# Patient Record
Sex: Female | Born: 1950 | Race: White | Hispanic: No | Marital: Single | Smoking: Former smoker
Health system: Southern US, Community
[De-identification: ages and names within clinical notes are randomized; demographics above are authoritative.]

## PROBLEM LIST (undated history)

## (undated) DIAGNOSIS — J189 Pneumonia, unspecified organism: Secondary | ICD-10-CM

## (undated) DIAGNOSIS — T8859XA Other complications of anesthesia, initial encounter: Secondary | ICD-10-CM

## (undated) DIAGNOSIS — G43109 Migraine with aura, not intractable, without status migrainosus: Secondary | ICD-10-CM

## (undated) DIAGNOSIS — I2510 Atherosclerotic heart disease of native coronary artery without angina pectoris: Secondary | ICD-10-CM

## (undated) DIAGNOSIS — G4730 Sleep apnea, unspecified: Secondary | ICD-10-CM

## (undated) DIAGNOSIS — T4145XA Adverse effect of unspecified anesthetic, initial encounter: Secondary | ICD-10-CM

## (undated) DIAGNOSIS — Z9289 Personal history of other medical treatment: Secondary | ICD-10-CM

## (undated) DIAGNOSIS — M1990 Unspecified osteoarthritis, unspecified site: Secondary | ICD-10-CM

## (undated) DIAGNOSIS — K219 Gastro-esophageal reflux disease without esophagitis: Secondary | ICD-10-CM

## (undated) DIAGNOSIS — Z8489 Family history of other specified conditions: Secondary | ICD-10-CM

## (undated) HISTORY — PX: MOUTH SURGERY: SHX715

## (undated) HISTORY — PX: CARPAL TUNNEL RELEASE: SHX101

## (undated) HISTORY — PX: CATARACT EXTRACTION W/ INTRAOCULAR LENS  IMPLANT, BILATERAL: SHX1307

---

## 2015-03-15 ENCOUNTER — Other Ambulatory Visit: Payer: Self-pay | Admitting: Internal Medicine

## 2015-03-15 ENCOUNTER — Inpatient Hospital Stay (HOSPITAL_COMMUNITY)
Admission: EM | Admit: 2015-03-15 | Discharge: 2015-04-04 | DRG: 208 | Disposition: A | Discharge status: Skilled Nursing Facility | Payer: Medicare HMO | Attending: Pulmonary Disease | Admitting: Pulmonary Disease

## 2015-03-15 ENCOUNTER — Ambulatory Visit
Admission: RE | Admit: 2015-03-15 | Discharge: 2015-03-15 | Disposition: A | Discharge status: Home / Self Care | Payer: Medicare HMO | Source: Ambulatory Visit | Attending: Internal Medicine | Admitting: Internal Medicine

## 2015-03-15 ENCOUNTER — Encounter (HOSPITAL_COMMUNITY): Payer: Self-pay | Admitting: Emergency Medicine

## 2015-03-15 DIAGNOSIS — I248 Other forms of acute ischemic heart disease: Secondary | ICD-10-CM | POA: Diagnosis present

## 2015-03-15 DIAGNOSIS — Z01818 Encounter for other preprocedural examination: Secondary | ICD-10-CM

## 2015-03-15 DIAGNOSIS — I10 Essential (primary) hypertension: Secondary | ICD-10-CM | POA: Diagnosis present

## 2015-03-15 DIAGNOSIS — I2510 Atherosclerotic heart disease of native coronary artery without angina pectoris: Secondary | ICD-10-CM | POA: Diagnosis present

## 2015-03-15 DIAGNOSIS — R0600 Dyspnea, unspecified: Secondary | ICD-10-CM

## 2015-03-15 DIAGNOSIS — R778 Other specified abnormalities of plasma proteins: Secondary | ICD-10-CM | POA: Diagnosis present

## 2015-03-15 DIAGNOSIS — J939 Pneumothorax, unspecified: Secondary | ICD-10-CM

## 2015-03-15 DIAGNOSIS — I421 Obstructive hypertrophic cardiomyopathy: Secondary | ICD-10-CM | POA: Diagnosis present

## 2015-03-15 DIAGNOSIS — R230 Cyanosis: Secondary | ICD-10-CM | POA: Diagnosis present

## 2015-03-15 DIAGNOSIS — Z978 Presence of other specified devices: Secondary | ICD-10-CM

## 2015-03-15 DIAGNOSIS — G4733 Obstructive sleep apnea (adult) (pediatric): Secondary | ICD-10-CM | POA: Diagnosis present

## 2015-03-15 DIAGNOSIS — I4891 Unspecified atrial fibrillation: Secondary | ICD-10-CM | POA: Diagnosis present

## 2015-03-15 DIAGNOSIS — J9600 Acute respiratory failure, unspecified whether with hypoxia or hypercapnia: Secondary | ICD-10-CM

## 2015-03-15 DIAGNOSIS — Z961 Presence of intraocular lens: Secondary | ICD-10-CM | POA: Diagnosis present

## 2015-03-15 DIAGNOSIS — I422 Other hypertrophic cardiomyopathy: Secondary | ICD-10-CM | POA: Diagnosis present

## 2015-03-15 DIAGNOSIS — R7989 Other specified abnormal findings of blood chemistry: Secondary | ICD-10-CM | POA: Diagnosis not present

## 2015-03-15 DIAGNOSIS — I058 Other rheumatic mitral valve diseases: Secondary | ICD-10-CM

## 2015-03-15 DIAGNOSIS — J189 Pneumonia, unspecified organism: Secondary | ICD-10-CM | POA: Diagnosis present

## 2015-03-15 DIAGNOSIS — Z87891 Personal history of nicotine dependence: Secondary | ICD-10-CM

## 2015-03-15 DIAGNOSIS — K219 Gastro-esophageal reflux disease without esophagitis: Secondary | ICD-10-CM | POA: Diagnosis present

## 2015-03-15 DIAGNOSIS — Z880 Allergy status to penicillin: Secondary | ICD-10-CM

## 2015-03-15 DIAGNOSIS — Z79899 Other long term (current) drug therapy: Secondary | ICD-10-CM

## 2015-03-15 DIAGNOSIS — Z881 Allergy status to other antibiotic agents status: Secondary | ICD-10-CM

## 2015-03-15 DIAGNOSIS — I5189 Other ill-defined heart diseases: Secondary | ICD-10-CM | POA: Insufficient documentation

## 2015-03-15 DIAGNOSIS — J9601 Acute respiratory failure with hypoxia: Secondary | ICD-10-CM | POA: Diagnosis present

## 2015-03-15 DIAGNOSIS — R0602 Shortness of breath: Secondary | ICD-10-CM | POA: Diagnosis present

## 2015-03-15 DIAGNOSIS — Z882 Allergy status to sulfonamides status: Secondary | ICD-10-CM

## 2015-03-15 DIAGNOSIS — Z6841 Body Mass Index (BMI) 40.0 and over, adult: Secondary | ICD-10-CM

## 2015-03-15 DIAGNOSIS — N179 Acute kidney failure, unspecified: Secondary | ICD-10-CM | POA: Diagnosis present

## 2015-03-15 DIAGNOSIS — I7389 Other specified peripheral vascular diseases: Secondary | ICD-10-CM

## 2015-03-15 DIAGNOSIS — I080 Rheumatic disorders of both mitral and aortic valves: Secondary | ICD-10-CM | POA: Diagnosis present

## 2015-03-15 DIAGNOSIS — J181 Lobar pneumonia, unspecified organism: Principal | ICD-10-CM | POA: Diagnosis present

## 2015-03-15 DIAGNOSIS — Z9842 Cataract extraction status, left eye: Secondary | ICD-10-CM

## 2015-03-15 DIAGNOSIS — J9690 Respiratory failure, unspecified, unspecified whether with hypoxia or hypercapnia: Secondary | ICD-10-CM

## 2015-03-15 DIAGNOSIS — M069 Rheumatoid arthritis, unspecified: Secondary | ICD-10-CM | POA: Diagnosis present

## 2015-03-15 DIAGNOSIS — Z23 Encounter for immunization: Secondary | ICD-10-CM

## 2015-03-15 DIAGNOSIS — Z9841 Cataract extraction status, right eye: Secondary | ICD-10-CM

## 2015-03-15 DIAGNOSIS — I5031 Acute diastolic (congestive) heart failure: Secondary | ICD-10-CM | POA: Diagnosis present

## 2015-03-15 HISTORY — DX: Adverse effect of unspecified anesthetic, initial encounter: T41.45XA

## 2015-03-15 HISTORY — DX: Pneumonia, unspecified organism: J18.9

## 2015-03-15 HISTORY — DX: Migraine with aura, not intractable, without status migrainosus: G43.109

## 2015-03-15 HISTORY — DX: Unspecified osteoarthritis, unspecified site: M19.90

## 2015-03-15 HISTORY — DX: Personal history of other medical treatment: Z92.89

## 2015-03-15 HISTORY — DX: Other complications of anesthesia, initial encounter: T88.59XA

## 2015-03-15 HISTORY — DX: Gastro-esophageal reflux disease without esophagitis: K21.9

## 2015-03-15 HISTORY — DX: Family history of other specified conditions: Z84.89

## 2015-03-15 HISTORY — DX: Sleep apnea, unspecified: G47.30

## 2015-03-15 LAB — DIFFERENTIAL
BASOS ABS: 0 10*3/uL (ref 0.0–0.1)
Basophils Relative: 0 %
Eosinophils Absolute: 0.1 10*3/uL (ref 0.0–0.7)
Eosinophils Relative: 1 %
LYMPHS ABS: 1.1 10*3/uL (ref 0.7–4.0)
LYMPHS PCT: 10 %
Monocytes Absolute: 1 10*3/uL (ref 0.1–1.0)
Monocytes Relative: 9 %
NEUTROS PCT: 80 %
Neutro Abs: 9.1 10*3/uL — ABNORMAL HIGH (ref 1.7–7.7)

## 2015-03-15 LAB — BASIC METABOLIC PANEL
ANION GAP: 14 (ref 5–15)
BUN: 11 mg/dL (ref 6–20)
CALCIUM: 9.2 mg/dL (ref 8.9–10.3)
CO2: 21 mmol/L — ABNORMAL LOW (ref 22–32)
Chloride: 101 mmol/L (ref 101–111)
Creatinine, Ser: 0.95 mg/dL (ref 0.44–1.00)
Glucose, Bld: 109 mg/dL — ABNORMAL HIGH (ref 65–99)
Potassium: 4.1 mmol/L (ref 3.5–5.1)
Sodium: 136 mmol/L (ref 135–145)

## 2015-03-15 LAB — CBC
HCT: 43.1 % (ref 36.0–46.0)
HEMOGLOBIN: 14.1 g/dL (ref 12.0–15.0)
MCH: 29.8 pg (ref 26.0–34.0)
MCHC: 32.7 g/dL (ref 30.0–36.0)
MCV: 91.1 fL (ref 78.0–100.0)
Platelets: 478 10*3/uL — ABNORMAL HIGH (ref 150–400)
RBC: 4.73 MIL/uL (ref 3.87–5.11)
RDW: 13 % (ref 11.5–15.5)
WBC: 11.7 10*3/uL — ABNORMAL HIGH (ref 4.0–10.5)

## 2015-03-15 LAB — I-STAT TROPONIN, ED: TROPONIN I, POC: 1.21 ng/mL — AB (ref 0.00–0.08)

## 2015-03-15 LAB — TROPONIN I: TROPONIN I: 0.97 ng/mL — AB (ref <0.031)

## 2015-03-15 LAB — BRAIN NATRIURETIC PEPTIDE: B Natriuretic Peptide: 94.5 pg/mL (ref 0.0–100.0)

## 2015-03-15 LAB — I-STAT CG4 LACTIC ACID, ED
Lactic Acid, Venous: 1.65 mmol/L (ref 0.5–2.0)
Lactic Acid, Venous: 0.79 mmol/L (ref 0.5–2.0)

## 2015-03-15 LAB — STREP PNEUMONIAE URINARY ANTIGEN: STREP PNEUMO URINARY ANTIGEN: NEGATIVE

## 2015-03-15 MED ORDER — BORON 3 MG PO CAPS: 3.0000 mg | ORAL_CAPSULE | Freq: Every day | ORAL | Status: DC

## 2015-03-15 MED ORDER — LEVOFLOXACIN IN D5W 750 MG/150ML IV SOLN
750.0000 mg | INTRAVENOUS | Status: DC
  Administered 2015-03-16 – 2015-03-17 (×2): 750 mg via INTRAVENOUS
  Filled 2015-03-15 (×3): qty 150

## 2015-03-15 MED ORDER — PNEUMOCOCCAL VAC POLYVALENT 25 MCG/0.5ML IJ INJ
0.5000 mL | INJECTION | INTRAMUSCULAR | Status: AC
  Administered 2015-03-16: 0.5 mL via INTRAMUSCULAR
  Filled 2015-03-15: qty 0.5

## 2015-03-15 MED ORDER — CALCIUM CARBONATE ANTACID 1000 MG PO CHEW: 1000.0000 mg | CHEWABLE_TABLET | Freq: Every day | ORAL | Status: DC

## 2015-03-15 MED ORDER — SODIUM CHLORIDE 0.9 % IV BOLUS (SEPSIS)
1000.0000 mL | Freq: Once | INTRAVENOUS | Status: AC
  Administered 2015-03-15: 1000 mL via INTRAVENOUS

## 2015-03-15 MED ORDER — LECITHIN 1200 MG PO CAPS: 1200.0000 mg | ORAL_CAPSULE | Freq: Every day | ORAL | Status: DC

## 2015-03-15 MED ORDER — LYSINE 1000 MG PO TABS: 1000.0000 mg | ORAL_TABLET | Freq: Every day | ORAL | Status: DC

## 2015-03-15 MED ORDER — LORATADINE 10 MG PO TABS
10.0000 mg | ORAL_TABLET | Freq: Every day | ORAL | Status: DC
  Administered 2015-03-15 – 2015-03-19 (×5): 10 mg via ORAL
  Filled 2015-03-15 (×6): qty 1

## 2015-03-15 MED ORDER — VITAMIN C 500 MG PO TABS
2000.0000 mg | ORAL_TABLET | Freq: Every day | ORAL | Status: DC
  Administered 2015-03-16 – 2015-03-20 (×5): 2000 mg via ORAL
  Filled 2015-03-15 (×5): qty 4

## 2015-03-15 MED ORDER — LEVOFLOXACIN IN D5W 750 MG/150ML IV SOLN
750.0000 mg | Freq: Once | INTRAVENOUS | Status: AC
  Administered 2015-03-15: 750 mg via INTRAVENOUS
  Filled 2015-03-15: qty 150

## 2015-03-15 MED ORDER — CINNAMON 500 MG PO CAPS: 1000.0000 mg | ORAL_CAPSULE | Freq: Every day | ORAL | Status: DC

## 2015-03-15 MED ORDER — ASPIRIN 81 MG PO CHEW
324.0000 mg | CHEWABLE_TABLET | Freq: Once | ORAL | Status: AC
  Administered 2015-03-15: 324 mg via ORAL
  Filled 2015-03-15: qty 4

## 2015-03-15 MED ORDER — RED YEAST RICE 600 MG PO TABS: 1200.0000 mg | ORAL_TABLET | Freq: Every day | ORAL | Status: DC

## 2015-03-15 MED ORDER — BETA CAROTENE 15 MG PO CAPS: 15.0000 mg | ORAL_CAPSULE | Freq: Every day | ORAL | Status: DC

## 2015-03-15 MED ORDER — VITAMIN C 500 MG PO TABS: 5000.0000 mg | ORAL_TABLET | Freq: Every day | ORAL | Status: DC

## 2015-03-15 MED ORDER — CALCIUM CARBONATE ANTACID 500 MG PO CHEW
3.0000 | CHEWABLE_TABLET | Freq: Every day | ORAL | Status: DC | PRN
  Administered 2015-03-18: 800 mg via ORAL
  Filled 2015-03-15 (×2): qty 4

## 2015-03-15 MED ORDER — ENOXAPARIN SODIUM 40 MG/0.4ML ~~LOC~~ SOLN
40.0000 mg | SUBCUTANEOUS | Status: DC
  Administered 2015-03-15 – 2015-04-03 (×13): 40 mg via SUBCUTANEOUS
  Filled 2015-03-15 (×15): qty 0.4

## 2015-03-15 MED ORDER — BILBERRY 1000 MG PO CAPS: 1000.0000 mg | ORAL_CAPSULE | Freq: Every day | ORAL | Status: DC

## 2015-03-15 NOTE — Progress Notes (Signed)
When pt arrived to the unit, she expressed a desire to reverse her code status from a DNR to a full code after she spoke with the MD. I asked another nurse to come into the room to verify what she was saying. Charlane Ferretti, RN came into the room to witness what she said, verifying that she be a FULL CODE. I paged Dr. Silvestre Moment to make him aware. Status changed

## 2015-03-15 NOTE — ED Notes (Signed)
Wheeled pt to room.

## 2015-03-15 NOTE — ED Provider Notes (Signed)
CSN: [id number]     Arrival date & time 03/15/15  1200 History   First MD Initiated Contact with Patient 03/15/15 1303     Chief Complaint  Patient presents with  . Shortness of Breath     (Consider location/radiation/quality/duration/timing/severity/associated sxs/prior Treatment) HPI 65 year old female who presents with shortness of breath. History of carpal tunnel and otherwise is healthy, and prior tobacco use. 2 days ago developed what she reported was flulike illness including generalized weakness, fatigue, bronchospastic cough, shortness of breath. Associated with diarrhea. States that illness has been progressive, with worsening shortness of breath. Has not had known fevers but reports chills. Denies chest pain, nausea or vomiting, abdominal pain, or urinary complaints. No lower extremity edema or pain, no orthopnea or PND. History reviewed. No pertinent past medical history. Past Surgical History  Procedure Laterality Date  . Carpal tunnel release Bilateral   . Cataract extraction Bilateral    History reviewed. No pertinent family history. Social History  Substance Use Topics  . Smoking status: Former Games developer  . Smokeless tobacco: None  . Alcohol Use: Yes     Comment: social   OB History    No data available     Review of Systems 10/14 systems reviewed and are negative other than those stated in the HPI    Allergies  Penicillins; Sulfa antibiotics; and Ceclor  Home Medications   Prior to Admission medications   Not on File   BP 117/60 mmHg  Pulse 92  Temp(Src) 97.6 F (36.4 C)  Resp 27  SpO2 92% Physical Exam Physical Exam  Nursing note and vitals reviewed. Constitutional: Well developed, well nourished, non-toxic, and in no acute distress Head: Normocephalic and atraumatic.  Mouth/Throat: Oropharynx is clear and moist.  Neck: Normal range of motion. Neck supple.  Cardiovascular: Normal rate and regular rhythm.  No edema. Pulmonary/Chest: Effort  normal. Mild conversational dyspnea, crackles and rhonchi at the lung bases. Abdominal: Soft. There is no tenderness. There is no rebound and no guarding.  Musculoskeletal: Normal range of motion.  Neurological: Alert, no facial droop, fluent speech, moves all extremities symmetrically Skin: Skin is warm and dry.  Psychiatric: Cooperative  ED Course  Procedures (including critical care time) Labs Review Labs Reviewed  BASIC METABOLIC PANEL - Abnormal; Notable for the following:    CO2 21 (*)    Glucose, Bld 109 (*)    All other components within normal limits  CBC - Abnormal; Notable for the following:    WBC 11.7 (*)    Platelets 478 (*)    All other components within normal limits  I-STAT TROPOININ, ED - Abnormal; Notable for the following:    Troponin i, poc 1.21 (*)    All other components within normal limits  BRAIN NATRIURETIC PEPTIDE  DIFFERENTIAL  I-STAT CG4 LACTIC ACID, ED    Imaging Review Dg Chest 2 View  03/15/2015  CLINICAL DATA:  Severe shortness of breath for 1 week EXAM: CHEST  2 VIEW COMPARISON:  None. FINDINGS: Mild cardiac enlargement. Vascular pattern is normal. Multifocal airspace disease throughout the bilateral mid to lower lung zones, left worse than right. No pleural effusion. IMPRESSION: Moderately severe multifocal bilateral nonspecific airspace disease. One consideration would be pneumonia. Electronically Signed   By: Esperanza Heir M.D.   On: 03/15/2015 11:19   I have personally reviewed and evaluated these images and lab results as part of my medical decision-making.   EKG Interpretation   Date/Time:  Friday March 15 2015 12:04:48 EST  Ventricular Rate:  97 PR Interval:  128 QRS Duration: 96 QT Interval:  360 QTC Calculation: 457 R Axis:   -29 Text Interpretation:  Normal sinus rhythm Left ventricular hypertrophy  with repolarization abnormality Cannot rule out Septal infarct , age  undetermined Abnormal ECG Confirmed by Aggie Douse MD, Annabelle Harman  ([id number]) on 03/15/2015  1:29:01 PM      MDM   Final diagnoses:  None    65 year old female who presents with 1-2 days of flulike illness with progressive shortness of breath. On room air with normal oxygenation, but some mild conversational dyspnea. Chest x-ray reviewed from today, and showing evidence of multifocal infiltrates most likely due to pneumonia.  Initiated on Levaquin for treatment of community-acquired pneumonia. She has normal lactate, and mild leukocytosis. With evidence of troponin elevation of 1.2. Seems a little bit more elevated to be just demand ischemia, and EKG revealing LVH with suggestion of ischemia; however, EKG read does state acute MI.  2:09 PM Discussed with Dr. Excell Seltzer from cardiology. Question possible myocarditis given significant troponin elevation, but does not think EKG concerning for STEMI. Recommended  ECHO. Discussed with Dr. Waymon Amato who will admit to telemetry.  Lavera Guise, MD 03/15/15 1500

## 2015-03-15 NOTE — H&P (Signed)
Patient Demographics  Rachel Baker, is a 65 y.o. female  MRN: [id number]   DOB - 02-28-1950  Admit Date - 03/15/2015  Outpatient Primary MD for the patient is No primary care provider on file.   With History of -  History reviewed. No pertinent past medical history.    Past Surgical History  Procedure Laterality Date  . Carpal tunnel release Bilateral   . Cataract extraction Bilateral     in for   Chief Complaint  Patient presents with  . Shortness of Breath     HPI  Rachel Baker  is a 65 y.o. female, without significant past medical history, history of prior tobacco use, quit 25 years ago, patient was sent for her PCP for pneumonia, report she has been complaining of dyspnea, denies any cough, with the PCP where he is an x-ray which he found her to have a pneumonia, she was sent to ED, in ED chest x-ray showing multifocal pneumonia, after the levofloxacin, afebrile, no hypoxia or leukocytosis, patient troponin point-of-care was positive, patient denies any chest pain, diaphoresis or palpitation, EKG was abnormal, no baseline EKG to compare, hospitalist requested to admit for further evaluation.    Review of Systems    In addition to the HPI above, No Fever-chills, No Headache, No changes with Vision or hearing, No problems swallowing food or Liquids, No Chest pain, Cough she reports Shortness of Breath, No Abdominal pain, No Nausea or Vommitting, Bowel movements are regular, No Blood in stool or Urine, No dysuria, No new skin rashes or bruises, No new joints pains-aches,  No new weakness, tingling, numbness in any extremity, No recent weight gain or loss, No polyuria, polydypsia or polyphagia, No significant Mental Stressors.  A full 10 point Review of Systems was done, except as stated above, all other Review of Systems were negative.   Social History Social History  Substance Use Topics  . Smoking status: Former Games developer  . Smokeless tobacco:  Not on file  . Alcohol Use: Yes     Comment: social    Family History History reviewed. No pertinent family history.   Prior to Admission medications   Medication Sig Start Date End Date Taking? Authorizing Provider  Ascorbic Acid (VITAMIN C) 1000 MG tablet Take 5,000 mg by mouth daily.   Yes Historical Provider, MD  B Complex Vitamins (B COMPLEX PO) Take 1 tablet by mouth daily.   Yes Historical Provider, MD  beta carotene 15 MG capsule Take 15 mg by mouth daily.   Yes Historical Provider, MD  Bilberry 1000 MG CAPS Take 1,000 mg by mouth daily.   Yes Historical Provider, MD  Boron 3 MG CAPS Take 3 mg by mouth daily.   Yes Historical Provider, MD  calcium elemental as carbonate (BARIATRIC TUMS ULTRA) 400 MG chewable tablet Chew 1,000 mg by mouth daily. With Magnesium 500 mg   Yes Historical Provider, MD  cetirizine-pseudoephedrine (ZYRTEC-D) 5-120 MG tablet Take 1 tablet by mouth at bedtime.   Yes Historical Provider, MD  Cinnamon 500 MG capsule Take 1,000 mg by mouth daily.   Yes Historical Provider, MD  CRANBERRY EXTRACT PO Take 1 tablet by mouth daily.   Yes Historical Provider, MD  GARLIC PO Take 1 tablet by mouth daily.   Yes Historical Provider, MD  Lecithin 1200 MG CAPS Take 1,200 mg by mouth daily.   Yes Historical Provider, MD  Lysine 1000 MG TABS Take 1,000 mg by mouth daily.  Yes Historical Provider, MD  OVER THE COUNTER MEDICATION Take 3 tablets by mouth daily. [employer] Dr. Joints   Yes Historical Provider, MD  Probiotic Product (PROBIOTIC PO) Take 1 tablet by mouth daily.   Yes Historical Provider, MD  Red Yeast Rice 600 MG TABS Take 1,200 mg by mouth daily.   Yes Historical Provider, MD    Allergies  Allergen Reactions  . Penicillins Hives and Itching    Has patient had a PCN reaction causing immediate rash, facial/tongue/throat swelling, SOB or lightheadedness with hypotension: Yes Has patient had a PCN reaction causing severe rash involving mucus membranes or skin  necrosis: No Has patient had a PCN reaction that required hospitalization No Has patient had a PCN reaction occurring within the last 10 years: No If all of the above answers are "NO", then may proceed with Cephalosporin use.   . Sulfa Antibiotics Hives and Itching  . Ceclor [Cefaclor] Itching and Rash    Physical Exam  Vitals  Blood pressure 108/65, pulse 90, temperature 97.6 F (36.4 C), resp. rate 20, SpO2 93 %.   1. General well-developed female lying in bed in NAD,    2. Normal affect and insight, Not Suicidal or Homicidal, Awake Alert, Oriented X 3.  3. No F.N deficits, ALL C.Nerves Intact, Strength 5/5 all 4 extremities, Sensation intact all 4 extremities, Plantars down going.  4. Ears and Eyes appear Normal, Conjunctivae clear, PERRLA. Moist Oral Mucosa.  5. Supple Neck, No JVD, No cervical lymphadenopathy appriciated, No Carotid Bruits.  6. Symmetrical Chest wall movement, Good air movement bilaterally, CTAB.  7. RRR, No Gallops, Rubs or Murmurs, No Parasternal Heave.  8. Positive Bowel Sounds, Abdomen Soft, No tenderness, No organomegaly appriciated,No rebound -guarding or rigidity.  9.  No Cyanosis, Normal Skin Turgor, No Skin Rash or Bruise.  10. Good muscle tone,  joints appear normal , no effusions, Normal ROM.  11. No Palpable Lymph Nodes in Neck or Axillae    Data Review  CBC  Recent Labs Lab 03/15/15 1219  WBC 11.7*  HGB 14.1  HCT 43.1  PLT 478*  MCV 91.1  MCH 29.8  MCHC 32.7  RDW 13.0  LYMPHSABS 1.1  MONOABS 1.0  EOSABS 0.1  BASOSABS 0.0   ------------------------------------------------------------------------------------------------------------------  Chemistries   Recent Labs Lab 03/15/15 1219  NA 136  K 4.1  CL 101  CO2 21*  GLUCOSE 109*  BUN 11  CREATININE 0.95  CALCIUM 9.2   ------------------------------------------------------------------------------------------------------------------ CrCl cannot be calculated  (Unknown ideal weight.). ------------------------------------------------------------------------------------------------------------------ No results for input(s): TSH, T4TOTAL, T3FREE, THYROIDAB in the last 72 hours.  Invalid input(s): FREET3   Coagulation profile No results for input(s): INR, PROTIME in the last 168 hours. ------------------------------------------------------------------------------------------------------------------- No results for input(s): DDIMER in the last 72 hours. -------------------------------------------------------------------------------------------------------------------  Cardiac Enzymes No results for input(s): CKMB, TROPONINI, MYOGLOBIN in the last 168 hours.  Invalid input(s): CK ------------------------------------------------------------------------------------------------------------------ Invalid input(s): POCBNP   ---------------------------------------------------------------------------------------------------------------  Urinalysis No results found for: COLORURINE, APPEARANCEUR, LABSPEC, PHURINE, GLUCOSEU, HGBUR, BILIRUBINUR, KETONESUR, PROTEINUR, UROBILINOGEN, NITRITE, LEUKOCYTESUR  ----------------------------------------------------------------------------------------------------------------  Imaging results:   Dg Chest 2 View  03/15/2015  CLINICAL DATA:  Severe shortness of breath for 1 week EXAM: CHEST  2 VIEW COMPARISON:  None. FINDINGS: Mild cardiac enlargement. Vascular pattern is normal. Multifocal airspace disease throughout the bilateral mid to lower lung zones, left worse than right. No pleural effusion. IMPRESSION: Moderately severe multifocal bilateral nonspecific airspace disease. One consideration would be pneumonia. Electronically Signed   By: Esperanza Heir  M.D.   On: 03/15/2015 11:19        Assessment & Plan  Active Problems:   CAP (community acquired pneumonia)   Pneumonia   Elevated  troponin   Community-acquired pneumonia - Patient presents with dyspnea, chest x-ray significant for multifocal pneumonia, admitted under pneumonia pathway, will obtain Legionella and strep pneumoniae antibodies, we'll follow sputum cultures, continue with IV levofloxacin.  Elevated troponin with abnormal EKG - Patient had elevated point of care troponin, she denies any chest pain, will admit to telemetry, will cycle her cardiac enzymes, will check 2-D echo in a.m., cardiology consulted by ED.   DVT Prophylaxis  Lovenox - SCDs  AM Labs Ordered, also please review Full Orders  Family Communication: Admission, patients condition and plan of care including tests being ordered have been discussed with the patient and sister who indicate understanding and agree with the plan and Code Status.  Code Status DO NOT RESUSCITATE confirmed by patient  Likely DC to  home  Condition GUARDED    Time spent in minutes : 50 minutes    Lalla Laham M.D on 03/15/2015 at 2:48 PM  Between 7am to 7pm - Pager - [phone]  After 7pm go to [email or web address] - password [email or web address]  And look for the night coverage person covering me after hours  [facility] Office  [phone]

## 2015-03-15 NOTE — ED Notes (Addendum)
Pt reports SOB x 3 days which has continued to get worse. Pt reports she went to urgent care and they reported that she had pneumonia. Pt alert x4. Pt also has discoloration to middle finger on right hand x 7 days.

## 2015-03-15 NOTE — Progress Notes (Addendum)
PHARMACIST - PHYSICIAN ORDER COMMUNICATION  CONCERNING: P&T Medication Policy on Herbal Medications  DESCRIPTION:  This patient's order for:  Beta carotine, bilberry, boron, cinnamon, lecithin, red yeast rice, lysine  has been noted.  This product(s) is classified as an "herbal" or natural product. Due to a lack of definitive safety studies or FDA approval, nonstandard manufacturing practices, plus the potential risk of unknown drug-drug interactions while on inpatient medications, the Pharmacy and Therapeutics Committee does not permit the use of "herbal" or natural products of this type within [facility].   ACTION TAKEN: The pharmacy department is unable to verify this order at this time and your patient has been informed of this safety policy. Please reevaluate patient's clinical condition at discharge and address if the herbal or natural product(s) should be resumed at that time.  [place]erve, [address].D., BCPS Clinical Pharmacist Pager: [phone] 03/15/2015 5:15 PM

## 2015-03-15 NOTE — Progress Notes (Signed)
CODE STATUS changed to full code as per patient request. Huey Bienenstock MD

## 2015-03-15 NOTE — ED Notes (Signed)
Pt ambulated to restroom located in room.

## 2015-03-16 ENCOUNTER — Observation Stay (HOSPITAL_BASED_OUTPATIENT_CLINIC_OR_DEPARTMENT_OTHER): Payer: Medicare HMO

## 2015-03-16 DIAGNOSIS — R0600 Dyspnea, unspecified: Secondary | ICD-10-CM | POA: Diagnosis not present

## 2015-03-16 DIAGNOSIS — N179 Acute kidney failure, unspecified: Secondary | ICD-10-CM | POA: Diagnosis not present

## 2015-03-16 DIAGNOSIS — Z9842 Cataract extraction status, left eye: Secondary | ICD-10-CM | POA: Diagnosis not present

## 2015-03-16 DIAGNOSIS — K219 Gastro-esophageal reflux disease without esophagitis: Secondary | ICD-10-CM | POA: Diagnosis not present

## 2015-03-16 DIAGNOSIS — I7300 Raynaud's syndrome without gangrene: Secondary | ICD-10-CM

## 2015-03-16 DIAGNOSIS — R7989 Other specified abnormal findings of blood chemistry: Secondary | ICD-10-CM

## 2015-03-16 DIAGNOSIS — Z6841 Body Mass Index (BMI) 40.0 and over, adult: Secondary | ICD-10-CM | POA: Diagnosis not present

## 2015-03-16 DIAGNOSIS — I10 Essential (primary) hypertension: Secondary | ICD-10-CM | POA: Diagnosis not present

## 2015-03-16 DIAGNOSIS — Z79899 Other long term (current) drug therapy: Secondary | ICD-10-CM | POA: Diagnosis not present

## 2015-03-16 DIAGNOSIS — J189 Pneumonia, unspecified organism: Secondary | ICD-10-CM | POA: Diagnosis not present

## 2015-03-16 DIAGNOSIS — I050 Rheumatic mitral stenosis: Secondary | ICD-10-CM | POA: Diagnosis not present

## 2015-03-16 DIAGNOSIS — R230 Cyanosis: Secondary | ICD-10-CM | POA: Diagnosis not present

## 2015-03-16 DIAGNOSIS — I5031 Acute diastolic (congestive) heart failure: Secondary | ICD-10-CM | POA: Diagnosis not present

## 2015-03-16 DIAGNOSIS — I7389 Other specified peripheral vascular diseases: Secondary | ICD-10-CM | POA: Diagnosis not present

## 2015-03-16 DIAGNOSIS — J9601 Acute respiratory failure with hypoxia: Secondary | ICD-10-CM | POA: Diagnosis not present

## 2015-03-16 DIAGNOSIS — Z87891 Personal history of nicotine dependence: Secondary | ICD-10-CM | POA: Diagnosis not present

## 2015-03-16 DIAGNOSIS — J181 Lobar pneumonia, unspecified organism: Secondary | ICD-10-CM | POA: Diagnosis not present

## 2015-03-16 DIAGNOSIS — Z961 Presence of intraocular lens: Secondary | ICD-10-CM | POA: Diagnosis not present

## 2015-03-16 DIAGNOSIS — I422 Other hypertrophic cardiomyopathy: Secondary | ICD-10-CM | POA: Diagnosis not present

## 2015-03-16 DIAGNOSIS — G4733 Obstructive sleep apnea (adult) (pediatric): Secondary | ICD-10-CM | POA: Diagnosis not present

## 2015-03-16 DIAGNOSIS — Z882 Allergy status to sulfonamides status: Secondary | ICD-10-CM | POA: Diagnosis not present

## 2015-03-16 DIAGNOSIS — M069 Rheumatoid arthritis, unspecified: Secondary | ICD-10-CM | POA: Diagnosis not present

## 2015-03-16 DIAGNOSIS — Z881 Allergy status to other antibiotic agents status: Secondary | ICD-10-CM | POA: Diagnosis not present

## 2015-03-16 DIAGNOSIS — I2510 Atherosclerotic heart disease of native coronary artery without angina pectoris: Secondary | ICD-10-CM | POA: Diagnosis not present

## 2015-03-16 DIAGNOSIS — Z23 Encounter for immunization: Secondary | ICD-10-CM | POA: Diagnosis not present

## 2015-03-16 DIAGNOSIS — Z9841 Cataract extraction status, right eye: Secondary | ICD-10-CM | POA: Diagnosis not present

## 2015-03-16 DIAGNOSIS — I4891 Unspecified atrial fibrillation: Secondary | ICD-10-CM | POA: Diagnosis not present

## 2015-03-16 DIAGNOSIS — I350 Nonrheumatic aortic (valve) stenosis: Secondary | ICD-10-CM | POA: Diagnosis not present

## 2015-03-16 DIAGNOSIS — R011 Cardiac murmur, unspecified: Secondary | ICD-10-CM | POA: Diagnosis not present

## 2015-03-16 DIAGNOSIS — Z880 Allergy status to penicillin: Secondary | ICD-10-CM | POA: Diagnosis not present

## 2015-03-16 DIAGNOSIS — I248 Other forms of acute ischemic heart disease: Secondary | ICD-10-CM | POA: Diagnosis not present

## 2015-03-16 DIAGNOSIS — I421 Obstructive hypertrophic cardiomyopathy: Secondary | ICD-10-CM | POA: Diagnosis not present

## 2015-03-16 DIAGNOSIS — I080 Rheumatic disorders of both mitral and aortic valves: Secondary | ICD-10-CM | POA: Diagnosis not present

## 2015-03-16 LAB — COMPREHENSIVE METABOLIC PANEL
ALBUMIN: 2.3 g/dL — AB (ref 3.5–5.0)
ALK PHOS: 59 U/L (ref 38–126)
ALT: 21 U/L (ref 14–54)
ANION GAP: 11 (ref 5–15)
AST: 27 U/L (ref 15–41)
BUN: 11 mg/dL (ref 6–20)
CALCIUM: 8.6 mg/dL — AB (ref 8.9–10.3)
CO2: 21 mmol/L — AB (ref 22–32)
Chloride: 103 mmol/L (ref 101–111)
Creatinine, Ser: 0.73 mg/dL (ref 0.44–1.00)
GFR calc Af Amer: >60 mL/min (ref >60)
GFR calc non Af Amer: >60 mL/min (ref >60)
GLUCOSE: 116 mg/dL — AB (ref 65–99)
POTASSIUM: 3.9 mmol/L (ref 3.5–5.1)
SODIUM: 135 mmol/L (ref 135–145)
Total Bilirubin: 0.2 mg/dL — ABNORMAL LOW (ref 0.3–1.2)
Total Protein: 5.8 g/dL — ABNORMAL LOW (ref 6.5–8.1)

## 2015-03-16 LAB — CBC
HCT: 39.8 % (ref 36.0–46.0)
HEMOGLOBIN: 12.9 g/dL (ref 12.0–15.0)
MCH: 29.4 pg (ref 26.0–34.0)
MCHC: 32.4 g/dL (ref 30.0–36.0)
MCV: 90.7 fL (ref 78.0–100.0)
Platelets: 427 10*3/uL — ABNORMAL HIGH (ref 150–400)
RBC: 4.39 MIL/uL (ref 3.87–5.11)
RDW: 13.2 % (ref 11.5–15.5)
WBC: 9.7 10*3/uL (ref 4.0–10.5)

## 2015-03-16 LAB — URINALYSIS, ROUTINE W REFLEX MICROSCOPIC
BILIRUBIN URINE: NEGATIVE
GLUCOSE, UA: NEGATIVE mg/dL
KETONES UR: NEGATIVE mg/dL
Nitrite: NEGATIVE
PROTEIN: NEGATIVE mg/dL
Specific Gravity, Urine: 1.006 (ref 1.005–1.030)
pH: 6.5 (ref 5.0–8.0)

## 2015-03-16 LAB — TROPONIN I
TROPONIN I: 0.61 ng/mL — AB (ref <0.031)
Troponin I: 0.91 ng/mL (ref <0.031)

## 2015-03-16 LAB — HIV ANTIBODY (ROUTINE TESTING W REFLEX): HIV SCREEN 4TH GENERATION: NONREACTIVE

## 2015-03-16 LAB — INFLUENZA PANEL BY PCR (TYPE A & B)
H1N1FLUPCR: NOT DETECTED
INFLBPCR: NEGATIVE
Influenza A By PCR: NEGATIVE

## 2015-03-16 LAB — URINE MICROSCOPIC-ADD ON

## 2015-03-16 LAB — PROCALCITONIN: Procalcitonin: <0.1 ng/mL

## 2015-03-16 LAB — TSH: TSH: 1.955 u[IU]/mL (ref 0.350–4.500)

## 2015-03-16 MED ORDER — ASPIRIN 81 MG PO CHEW
81.0000 mg | CHEWABLE_TABLET | Freq: Every day | ORAL | Status: DC
  Administered 2015-03-16 – 2015-03-20 (×5): 81 mg via ORAL
  Filled 2015-03-16 (×5): qty 1

## 2015-03-16 MED ORDER — POTASSIUM CHLORIDE CRYS ER 20 MEQ PO TBCR
20.0000 meq | EXTENDED_RELEASE_TABLET | Freq: Once | ORAL | Status: AC
  Administered 2015-03-16: 20 meq via ORAL
  Filled 2015-03-16: qty 1

## 2015-03-16 MED ORDER — FUROSEMIDE 10 MG/ML IJ SOLN
20.0000 mg | Freq: Once | INTRAMUSCULAR | Status: AC
  Administered 2015-03-16: 20 mg via INTRAVENOUS
  Filled 2015-03-16: qty 2

## 2015-03-16 NOTE — Progress Notes (Signed)
Patient visibly SOB.  She refused O2.  Dr. Jerral Ralph notified.

## 2015-03-16 NOTE — Progress Notes (Signed)
PATIENT DETAILS Name: Rachel Baker Age: 65 y.o. Sex: female Date of Birth: Jul 09, 1950 Admit Date: 03/15/2015 Admitting Physician Elease Etienne, MD OZH:[id number],[email or web address], MD  Brief narrative:  65 year old female with no significant past medical history presented with 2 week history of exertional dyspnea and generalized weakness. Approximately 2 weeks back, patient had a flulike illness. Further evaluation with a chest x-ray showed multifocal lung infiltrates highly suspicious for pneumonia. She was also found to have related troponin levels. She was subsequently admitted for further evaluation and treatment. See below for further details  Subjective: Continues to have shortness of breath-mostly on exertion-essentially unchanged compared to yesterday.  Assessment/Plan: Active Problems: Presumed CAP (community acquired pneumonia): Continue empiric levofloxacin, blood cultures negative so far. Given normal BNP levels, lung infiltrates likely pneumonia. Urine streptococcal antigen negative, Legionella antigen/HIV pending. Await influenza PCR and respiratory virus panel. Check pro calcitonin levels.  Elevated troponin level: Trend is flat and not consistent with ACS. Await echocardiogram, will likely need mucus stress test at some point in the future. Cardiology following.  Systolic murmur: Await echocardiogram-likely aortic stenosis-but given bilateral pulmonary infiltrates-made to be concerned about endocarditis special. Blood cultures come back positive.  Bluish discoloration of the distal phalanx of the right middle finger:? Raynaud's phenomenon-alternatively could have embolic event. On numerous over-the-counter herbal supplements-not sure of these could account for this as well. Await echocardiogram and blood cultures.  Disposition: Remain inpatient  Antimicrobial agents  See below  Anti-infectives    Start     Dose/Rate Route Frequency Ordered Stop   03/16/15  1500  levofloxacin (LEVAQUIN) IVPB 750 mg     750 mg 100 mL/hr over 90 Minutes Intravenous Every 24 hours 03/15/15 1657 03/21/15 1459   03/15/15 1345  levofloxacin (LEVAQUIN) IVPB 750 mg     750 mg 100 mL/hr over 90 Minutes Intravenous  Once 03/15/15 1330 03/15/15 1527      DVT Prophylaxis: Prophylactic Lovenox   Code Status: Full code   Family Communication None at bedside  Procedures: None  CONSULTS:  cardiology  Time spent 30 minutes-Greater than 50% of this time was spent in counseling, explanation of diagnosis, planning of further management, and coordination of care.  MEDICATIONS: Scheduled Meds: . enoxaparin (LOVENOX) injection  40 mg Subcutaneous Q24H  . levofloxacin (LEVAQUIN) IV  750 mg Intravenous Q24H  . loratadine  10 mg Oral Daily  . vitamin C  2,000 mg Oral Daily   Continuous Infusions:  PRN Meds:.calcium carbonate    PHYSICAL EXAM: Vital signs in last 24 hours: Filed Vitals:   03/15/15 1620 03/15/15 2100 03/16/15 0500 03/16/15 0732  BP: 134/54 129/43 123/54 134/52  Pulse: 90 92 101 88  Temp: 98.3 F (36.8 C) 97.7 F (36.5 C) 98.6 F (37 C) 98.7 F (37.1 C)  TempSrc: Oral Oral Oral Oral  Resp: 20 20 20 20   Height: 5\' 3"  (1.6 m)     Weight: 108.636 kg (239 lb 8 oz)     SpO2: 93% 92% 93% 91%    Weight change:  Filed Weights   03/15/15 1620  Weight: 108.636 kg (239 lb 8 oz)   Body mass index is 42.44 kg/(m^2).   Gen Exam: Awake and alert with clear speech.  Neck: Supple, No JVD.   Chest: Moving air-bibasilar rales CVS: S1 S2 Regular, + systolic murmur.  Abdomen: soft, BS +, non tender, non distended.  Extremities: no edema, lower extremities  warm to touch. Neurologic: Non Focal.   Skin: No Rash.   Wounds: N/A.   Intake/Output from previous day:  Intake/Output Summary (Last 24 hours) at 03/16/15 1329 Last data filed at 03/16/15 1200  Gross per 24 hour  Intake    480 ml  Output    700 ml  Net   -220 ml     LAB  RESULTS: CBC  Recent Labs Lab 03/15/15 1219 03/16/15 1158  WBC 11.7* 9.7  HGB 14.1 12.9  HCT 43.1 39.8  PLT 478* 427*  MCV 91.1 90.7  MCH 29.8 29.4  MCHC 32.7 32.4  RDW 13.0 13.2  LYMPHSABS 1.1  --   MONOABS 1.0  --   EOSABS 0.1  --   BASOSABS 0.0  --     Chemistries   Recent Labs Lab 03/15/15 1219 03/16/15 1158  NA 136 135  K 4.1 3.9  CL 101 103  CO2 21* 21*  GLUCOSE 109* 116*  BUN 11 11  CREATININE 0.95 0.73  CALCIUM 9.2 8.6*    CBG: No results for input(s): GLUCAP in the last 168 hours.  GFR Estimated Creatinine Clearance: 82.9 mL/min (by C-G formula based on Cr of 0.73).  Coagulation profile No results for input(s): INR, PROTIME in the last 168 hours.  Cardiac Enzymes  Recent Labs Lab 03/15/15 2029 03/16/15 0232 03/16/15 1158  TROPONINI 0.97* 0.91* 0.61*    Invalid input(s): POCBNP No results for input(s): DDIMER in the last 72 hours. No results for input(s): HGBA1C in the last 72 hours. No results for input(s): CHOL, HDL, LDLCALC, TRIG, CHOLHDL, LDLDIRECT in the last 72 hours. No results for input(s): TSH, T4TOTAL, T3FREE, THYROIDAB in the last 72 hours.  Invalid input(s): FREET3 No results for input(s): VITAMINB12, FOLATE, FERRITIN, TIBC, IRON, RETICCTPCT in the last 72 hours. No results for input(s): LIPASE, AMYLASE in the last 72 hours.  Urine Studies No results for input(s): UHGB, CRYS in the last 72 hours.  Invalid input(s): UACOL, UAPR, USPG, UPH, UTP, UGL, UKET, UBIL, UNIT, UROB, ULEU, UEPI, UWBC, URBC, UBAC, CAST, UCOM, BILUA  MICROBIOLOGY: Recent Results (from the past 240 hour(s))  Culture, blood (routine x 2) Call MD if unable to obtain prior to antibiotics being given     Status: None (Preliminary result)   Collection Time: 03/15/15  8:29 PM  Result Value Ref Range Status   Specimen Description BLOOD LEFT ANTECUBITAL  Final   Special Requests IN PEDIATRIC BOTTLE 4CC  Final   Culture NO GROWTH < 12 HOURS  Final   Report  Status PENDING  Incomplete  Culture, blood (routine x 2) Call MD if unable to obtain prior to antibiotics being given     Status: None (Preliminary result)   Collection Time: 03/15/15  8:34 PM  Result Value Ref Range Status   Specimen Description BLOOD RIGHT HAND  Final   Special Requests BOTTLES DRAWN AEROBIC ONLY 5CC  Final   Culture NO GROWTH < 12 HOURS  Final   Report Status PENDING  Incomplete    RADIOLOGY STUDIES/RESULTS: Dg Chest 2 View  03/15/2015  CLINICAL DATA:  Severe shortness of breath for 1 week EXAM: CHEST  2 VIEW COMPARISON:  None. FINDINGS: Mild cardiac enlargement. Vascular pattern is normal. Multifocal airspace disease throughout the bilateral mid to lower lung zones, left worse than right. No pleural effusion. IMPRESSION: Moderately severe multifocal bilateral nonspecific airspace disease. One consideration would be pneumonia. Electronically Signed   By: Esperanza Heir M.D.   On:  03/15/2015 11:19    Jeoffrey Massed, MD  [facility] Pager:[phone]  If 7PM-7AM, please contact night-coverage [email or web address] Password [email or web address] 03/16/2015, 1:29 PM   LOS: 1 day

## 2015-03-16 NOTE — Care Management Obs Status (Signed)
MEDICARE OBSERVATION STATUS NOTIFICATION   Patient Details  Name: Rachel Baker MRN: [id number] Date of Birth: 1950/02/05   Medicare Observation Status Notification Given:     MOON and C44 given pt; original to CM file.  Yves Dill, RN 03/16/2015, 3:30 PM

## 2015-03-16 NOTE — Progress Notes (Signed)
  Echocardiogram 2D Echocardiogram has been performed.  Arvil Chaco 03/16/2015, 3:01 PM

## 2015-03-16 NOTE — Consult Note (Signed)
Reason for Consult: Abnormal cardiac troponin  Requesting Physician: Ghimire  Cardiologist: New  HPI: This is a 65 y.o. female with a past medical history significant for the absence of known structural heart disease or major coronary risk factors. She presents with dyspnea presumed to be secondary to pneumonia. She had a prodromal illness with loose stools and sweating about 2 weeks earlier, her sister had the same symptoms. Her sister has improved, but the patient developed progressively worsening shortness of breath. Her chest x-ray showed infiltrates and she was just started on levofloxacin yesterday. She denies fever, chills, cough, hemoptysis, anorexia, nausea or vomiting or abdominal pain. She had a sensation of intense flushing last night accompanied by diaphoresis. She has not had chest pain, either anginal or pleuritic  She has also noticed purplish discoloration of her right third finger, limited to the distal phalanx. It is purplish in color. She reports that this has happened before if she is not careful to keep the finger warm. She distinctly remembers this occurring after having a myelogram years ago. Only that particular fingers ever affected.  She has not had any recent dental procedures or other medical invasive procedures.  Her chest x-ray shows bilateral infiltrates, left greater than right. It could well be compatible with heart failure rather than pneumonia. Her cardiac troponin is elevated and a plateau pattern at 0.9. Her electrocardiogram shows left ventricular hypertrophy with secondary repolarization abnormalities and leftward axis deviation.  PMHx:  Past Medical History  Diagnosis Date  . CAP (community acquired pneumonia) 03/15/2015  . Family history of adverse reaction to anesthesia     "my mother got combative"  . Complication of anesthesia     "I hallucinated when I had teeth worked on"  . Sleep apnea     "need breath rite strip sometimes"  (03/15/2015)  . History of blood transfusion     "w/tooth implant"  . GERD (gastroesophageal reflux disease)   . Ocular migraine     "all the time" (03/15/2015)  . Arthritis     "all over; different joints" (03/15/2015)   Past Surgical History  Procedure Laterality Date  . Carpal tunnel release Bilateral   . Cataract extraction w/ intraocular lens  implant, bilateral Bilateral 2000s  . Mouth surgery      dental implant    FAMHx: History reviewed. Her mother had a pacemaker in her 25s and had dementia. Her father died of cancer metastatic to the brain  SOCHx:  reports that she has quit smoking. Her smoking use included Cigarettes. She has a 9.57 pack-year smoking history. She has never used smokeless tobacco. She reports that she drinks alcohol. She reports that she does not use illicit drugs.  ALLERGIES: Allergies  Allergen Reactions  . Penicillins Hives and Itching    Has patient had a PCN reaction causing immediate rash, facial/tongue/throat swelling, SOB or lightheadedness with hypotension: Yes Has patient had a PCN reaction causing severe rash involving mucus membranes or skin necrosis: No Has patient had a PCN reaction that required hospitalization No Has patient had a PCN reaction occurring within the last 10 years: No If all of the above answers are "NO", then may proceed with Cephalosporin use.   . Sulfa Antibiotics Hives and Itching  . Ceclor [Cefaclor] Itching and Rash    ROS: Pertinent items noted in HPI and remainder of comprehensive ROS otherwise negative.  HOME MEDICATIONS: Prescriptions prior to admission  Medication Sig Dispense Refill Last Dose  . Ascorbic  Acid (VITAMIN C) 1000 MG tablet Take 2,000-3,000 mg by mouth daily. Takes 2 -3 tabs unless sick   03/14/2015 at Unknown time  . B Complex Vitamins (B COMPLEX PO) Take 1 tablet by mouth daily.   03/14/2015 at Unknown time  . beta carotene 15 MG capsule Take 15 mg by mouth daily.   03/14/2015 at Unknown time  .  Bilberry 1000 MG CAPS Take 1,000 mg by mouth daily.   03/14/2015 at Unknown time  . Boron 3 MG CAPS Take 3 mg by mouth daily.   03/14/2015 at Unknown time  . Calcium Carbonate Antacid (TUMS PO) Take 3-4 tablets by mouth daily as needed (indigestion).   unknown  . cetirizine-pseudoephedrine (ZYRTEC-D) 5-120 MG tablet Take 1 tablet by mouth at bedtime.   03/14/2015 at Unknown time  . Cinnamon 500 MG capsule Take 1,000 mg by mouth daily.   03/14/2015 at Unknown time  . CRANBERRY EXTRACT PO Take 1 tablet by mouth daily.   03/14/2015 at Unknown time  . GARLIC PO Take 1 tablet by mouth daily.   03/14/2015 at Unknown time  . Lecithin 1200 MG CAPS Take 1,200 mg by mouth daily.   03/14/2015 at Unknown time  . Lysine 1000 MG TABS Take 1,000 mg by mouth daily.   03/14/2015 at Unknown time  . OVER THE COUNTER MEDICATION Take 3 tablets by mouth daily. [facility] Dr. Joints   03/14/2015 at Unknown time  . Probiotic Product (PROBIOTIC PO) Take 1 tablet by mouth daily.   03/14/2015 at Unknown time  . Red Yeast Rice 600 MG TABS Take 1,200 mg by mouth daily.   03/14/2015 at Unknown time    HOSPITAL MEDICATIONS: I have reviewed the patient's current medications. Prior to Admission:  Prescriptions prior to admission  Medication Sig Dispense Refill Last Dose  . Ascorbic Acid (VITAMIN C) 1000 MG tablet Take 2,000-3,000 mg by mouth daily. Takes 2 -3 tabs unless sick   03/14/2015 at Unknown time  . B Complex Vitamins (B COMPLEX PO) Take 1 tablet by mouth daily.   03/14/2015 at Unknown time  . beta carotene 15 MG capsule Take 15 mg by mouth daily.   03/14/2015 at Unknown time  . Bilberry 1000 MG CAPS Take 1,000 mg by mouth daily.   03/14/2015 at Unknown time  . Boron 3 MG CAPS Take 3 mg by mouth daily.   03/14/2015 at Unknown time  . Calcium Carbonate Antacid (TUMS PO) Take 3-4 tablets by mouth daily as needed (indigestion).   unknown  . cetirizine-pseudoephedrine (ZYRTEC-D) 5-120 MG tablet Take 1 tablet by mouth at bedtime.   03/14/2015 at Unknown time    . Cinnamon 500 MG capsule Take 1,000 mg by mouth daily.   03/14/2015 at Unknown time  . CRANBERRY EXTRACT PO Take 1 tablet by mouth daily.   03/14/2015 at Unknown time  . GARLIC PO Take 1 tablet by mouth daily.   03/14/2015 at Unknown time  . Lecithin 1200 MG CAPS Take 1,200 mg by mouth daily.   03/14/2015 at Unknown time  . Lysine 1000 MG TABS Take 1,000 mg by mouth daily.   03/14/2015 at Unknown time  . OVER THE COUNTER MEDICATION Take 3 tablets by mouth daily. [facility] Dr. Joints   03/14/2015 at Unknown time  . Probiotic Product (PROBIOTIC PO) Take 1 tablet by mouth daily.   03/14/2015 at Unknown time  . Red Yeast Rice 600 MG TABS Take 1,200 mg by mouth daily.   03/14/2015 at Unknown  time   Scheduled: . enoxaparin (LOVENOX) injection  40 mg Subcutaneous Q24H  . levofloxacin (LEVAQUIN) IV  750 mg Intravenous Q24H  . loratadine  10 mg Oral Daily  . vitamin C  2,000 mg Oral Daily   Continuous:   VITALS: Blood pressure 134/52, pulse 88, temperature 98.7 F (37.1 C), temperature source Oral, resp. rate 20, height 5\' 3"  (1.6 m), weight 108.636 kg (239 lb 8 oz), SpO2 91 %.  PHYSICAL EXAM:  General: Alert, oriented x3, no distress; obesity limits aspects of her exam Head: no evidence of trauma, PERRL, EOMI, no exophtalmos or lid lag, no myxedema, no xanthelasma; normal ears, nose and oropharynx Neck: normal jugular venous pulsations and no hepatojugular reflux; brisk carotid pulses without delay and no carotid bruits Chest: clear to auscultation, no signs of consolidation by percussion or palpation, normal fremitus, symmetrical and full respiratory excursions Cardiovascular: normal position and quality of the apical impulse, regular rhythm, normal first heart sound and normal second heart sound, no rubs or gallops, 3/6 systolic murmur heard in the mid chest, loudest at the left lower sternal border, although it has features suggestive of an ejection murmur. Sounds more holosystolic towards the apex but does not  radiate to the axilla or the carotids. No diastolic murmur Abdomen: no tenderness or distention, no masses by palpation, no abnormal pulsatility or arterial bruits, normal bowel sounds, no hepatosplenomegaly Extremities: Distinct purplish discoloration of the distal phalanx of the third finger that does not blanch. Slight tenderness to touch. No nodules. no clubbing, cyanosis;  no edema; 2+ radial, ulnar and brachial pulses bilaterally; 2+ right femoral, posterior tibial and dorsalis pedis pulses; 2+ left femoral, posterior tibial and dorsalis pedis pulses; no subclavian or femoral bruits Neurological: grossly nonfocal   LABS  CBC  Recent Labs  03/15/15 1219  WBC 11.7*  NEUTROABS 9.1*  HGB 14.1  HCT 43.1  MCV 91.1  PLT 478*   Basic Metabolic Panel  Recent Labs  03/15/15 1219  NA 136  K 4.1  CL 101  CO2 21*  GLUCOSE 109*  BUN 11  CREATININE 0.95  CALCIUM 9.2   Liver Function Tests No results for input(s): AST, ALT, ALKPHOS, BILITOT, PROT, ALBUMIN in the last 72 hours. No results for input(s): LIPASE, AMYLASE in the last 72 hours. Cardiac Enzymes  Recent Labs  03/15/15 2029 03/16/15 0232  TROPONINI 0.97* 0.91*    IMAGING: Dg Chest 2 View  03/15/2015  CLINICAL DATA:  Severe shortness of breath for 1 week EXAM: CHEST  2 VIEW COMPARISON:  None. FINDINGS: Mild cardiac enlargement. Vascular pattern is normal. Multifocal airspace disease throughout the bilateral mid to lower lung zones, left worse than right. No pleural effusion. IMPRESSION: Moderately severe multifocal bilateral nonspecific airspace disease. One consideration would be pneumonia. Electronically Signed   By: 05/13/2015 M.D.   On: 03/15/2015 11:19    ECG: Normal sinus rhythm, left ventricular hypertrophy  TELEMETRY:  no arrhythmia detected  IMPRESSION/RECOMMENDATION:: 1. Possible heart failure. Her chest x-ray is compatible with pulmonary edema, which may explain her infiltrates and dyspnea rather  than pneumonia. However, BNP is very low.. An echocardiogram is pending.  2. Systolic murmur, most likely aortic stenosis, with secondary left ventricular hypertrophy. Alternatively, she may have hypertrophic obstructive cardiomyopathy. Unable to really elicit any changes in murmur with provocative maneuvers. She really has trouble performing the Valsalva maneuvers due to her dyspnea. Echo will be invaluable in making the diagnosis 3. Cyanosis of distal phalanx of right third  finger may represent Raynaud's syndrome, less likely a small embolic event/Janeway lesion. Suspicion for endocarditis is low at this time, but the echo may shed some light on this as well. Blood cultures have been sent. 4. Abnormal cardiac enzymes in a plateau pattern did not support an acute coronary syndrome. She does not have acute ST segment changes on the EKG (not withstanding the automatic computer interpretation that suggest inferior ST abnormalities, which I disagree with) and never had complaints of angina pectoris. Suspect "demand ischemia" related to either heart failure or pneumonia. No plan for invasive evaluation, no indication for anticoagulation. Consider future evaluation with outpatient myocardial perfusion study.   Time Spent Directly with Patient: 30 minutes  Thurmon Fair, MD, [facility] [phone] office [phone] pager   03/16/2015, 11:05 AM

## 2015-03-17 DIAGNOSIS — Z87891 Personal history of nicotine dependence: Secondary | ICD-10-CM | POA: Diagnosis not present

## 2015-03-17 DIAGNOSIS — I7300 Raynaud's syndrome without gangrene: Secondary | ICD-10-CM | POA: Diagnosis not present

## 2015-03-17 DIAGNOSIS — Z880 Allergy status to penicillin: Secondary | ICD-10-CM | POA: Diagnosis not present

## 2015-03-17 DIAGNOSIS — Z9842 Cataract extraction status, left eye: Secondary | ICD-10-CM | POA: Diagnosis not present

## 2015-03-17 DIAGNOSIS — I350 Nonrheumatic aortic (valve) stenosis: Secondary | ICD-10-CM | POA: Diagnosis not present

## 2015-03-17 DIAGNOSIS — I058 Other rheumatic mitral valve diseases: Secondary | ICD-10-CM | POA: Diagnosis not present

## 2015-03-17 DIAGNOSIS — I248 Other forms of acute ischemic heart disease: Secondary | ICD-10-CM | POA: Diagnosis present

## 2015-03-17 DIAGNOSIS — J180 Bronchopneumonia, unspecified organism: Secondary | ICD-10-CM | POA: Diagnosis not present

## 2015-03-17 DIAGNOSIS — K219 Gastro-esophageal reflux disease without esophagitis: Secondary | ICD-10-CM | POA: Diagnosis present

## 2015-03-17 DIAGNOSIS — I422 Other hypertrophic cardiomyopathy: Secondary | ICD-10-CM | POA: Diagnosis present

## 2015-03-17 DIAGNOSIS — I5031 Acute diastolic (congestive) heart failure: Secondary | ICD-10-CM | POA: Diagnosis present

## 2015-03-17 DIAGNOSIS — R0602 Shortness of breath: Secondary | ICD-10-CM | POA: Diagnosis not present

## 2015-03-17 DIAGNOSIS — J181 Lobar pneumonia, unspecified organism: Secondary | ICD-10-CM | POA: Diagnosis present

## 2015-03-17 DIAGNOSIS — Z881 Allergy status to other antibiotic agents status: Secondary | ICD-10-CM | POA: Diagnosis not present

## 2015-03-17 DIAGNOSIS — Z9841 Cataract extraction status, right eye: Secondary | ICD-10-CM | POA: Diagnosis not present

## 2015-03-17 DIAGNOSIS — I7389 Other specified peripheral vascular diseases: Secondary | ICD-10-CM | POA: Diagnosis not present

## 2015-03-17 DIAGNOSIS — N179 Acute kidney failure, unspecified: Secondary | ICD-10-CM | POA: Diagnosis present

## 2015-03-17 DIAGNOSIS — J189 Pneumonia, unspecified organism: Secondary | ICD-10-CM | POA: Insufficient documentation

## 2015-03-17 DIAGNOSIS — I050 Rheumatic mitral stenosis: Secondary | ICD-10-CM

## 2015-03-17 DIAGNOSIS — I421 Obstructive hypertrophic cardiomyopathy: Secondary | ICD-10-CM | POA: Diagnosis present

## 2015-03-17 DIAGNOSIS — R509 Fever, unspecified: Secondary | ICD-10-CM | POA: Diagnosis not present

## 2015-03-17 DIAGNOSIS — I2510 Atherosclerotic heart disease of native coronary artery without angina pectoris: Secondary | ICD-10-CM | POA: Diagnosis present

## 2015-03-17 DIAGNOSIS — Z961 Presence of intraocular lens: Secondary | ICD-10-CM | POA: Diagnosis present

## 2015-03-17 DIAGNOSIS — G4733 Obstructive sleep apnea (adult) (pediatric): Secondary | ICD-10-CM | POA: Diagnosis present

## 2015-03-17 DIAGNOSIS — Z6841 Body Mass Index (BMI) 40.0 and over, adult: Secondary | ICD-10-CM | POA: Diagnosis not present

## 2015-03-17 DIAGNOSIS — I340 Nonrheumatic mitral (valve) insufficiency: Secondary | ICD-10-CM | POA: Diagnosis not present

## 2015-03-17 DIAGNOSIS — I059 Rheumatic mitral valve disease, unspecified: Secondary | ICD-10-CM | POA: Diagnosis not present

## 2015-03-17 DIAGNOSIS — M069 Rheumatoid arthritis, unspecified: Secondary | ICD-10-CM | POA: Diagnosis present

## 2015-03-17 DIAGNOSIS — I4891 Unspecified atrial fibrillation: Secondary | ICD-10-CM | POA: Diagnosis present

## 2015-03-17 DIAGNOSIS — R7989 Other specified abnormal findings of blood chemistry: Secondary | ICD-10-CM | POA: Diagnosis not present

## 2015-03-17 DIAGNOSIS — Z882 Allergy status to sulfonamides status: Secondary | ICD-10-CM | POA: Diagnosis not present

## 2015-03-17 DIAGNOSIS — M79644 Pain in right finger(s): Secondary | ICD-10-CM | POA: Diagnosis not present

## 2015-03-17 DIAGNOSIS — I080 Rheumatic disorders of both mitral and aortic valves: Secondary | ICD-10-CM | POA: Diagnosis present

## 2015-03-17 DIAGNOSIS — J9601 Acute respiratory failure with hypoxia: Secondary | ICD-10-CM | POA: Diagnosis present

## 2015-03-17 DIAGNOSIS — I10 Essential (primary) hypertension: Secondary | ICD-10-CM | POA: Diagnosis present

## 2015-03-17 DIAGNOSIS — I5189 Other ill-defined heart diseases: Secondary | ICD-10-CM | POA: Diagnosis not present

## 2015-03-17 DIAGNOSIS — R230 Cyanosis: Secondary | ICD-10-CM | POA: Diagnosis present

## 2015-03-17 DIAGNOSIS — Z79899 Other long term (current) drug therapy: Secondary | ICD-10-CM | POA: Diagnosis not present

## 2015-03-17 LAB — COMPREHENSIVE METABOLIC PANEL
ALBUMIN: 2.3 g/dL — AB (ref 3.5–5.0)
ALT: 22 U/L (ref 14–54)
AST: 24 U/L (ref 15–41)
Alkaline Phosphatase: 59 U/L (ref 38–126)
Anion gap: 12 (ref 5–15)
BILIRUBIN TOTAL: 0.4 mg/dL (ref 0.3–1.2)
BUN: 12 mg/dL (ref 6–20)
CO2: 22 mmol/L (ref 22–32)
Calcium: 8.6 mg/dL — ABNORMAL LOW (ref 8.9–10.3)
Chloride: 103 mmol/L (ref 101–111)
Creatinine, Ser: 0.78 mg/dL (ref 0.44–1.00)
GFR calc non Af Amer: >60 mL/min (ref >60)
GLUCOSE: 116 mg/dL — AB (ref 65–99)
POTASSIUM: 4.1 mmol/L (ref 3.5–5.1)
SODIUM: 137 mmol/L (ref 135–145)
TOTAL PROTEIN: 6.2 g/dL — AB (ref 6.5–8.1)

## 2015-03-17 LAB — HEPATITIS C ANTIBODY: HCV Ab: <0.1 s/co ratio (ref 0.0–0.9)

## 2015-03-17 LAB — CBC
HEMATOCRIT: 40.8 % (ref 36.0–46.0)
Hemoglobin: 13.4 g/dL (ref 12.0–15.0)
MCH: 29.6 pg (ref 26.0–34.0)
MCHC: 32.8 g/dL (ref 30.0–36.0)
MCV: 90.3 fL (ref 78.0–100.0)
Platelets: 466 10*3/uL — ABNORMAL HIGH (ref 150–400)
RBC: 4.52 MIL/uL (ref 3.87–5.11)
RDW: 13.2 % (ref 11.5–15.5)
WBC: 10.2 10*3/uL (ref 4.0–10.5)

## 2015-03-17 LAB — BRAIN NATRIURETIC PEPTIDE: B NATRIURETIC PEPTIDE 5: 106.4 pg/mL — AB (ref 0.0–100.0)

## 2015-03-17 LAB — HEPATITIS B SURFACE ANTIGEN: HEP B S AG: NEGATIVE

## 2015-03-17 MED ORDER — FUROSEMIDE 10 MG/ML IJ SOLN
40.0000 mg | Freq: Every day | INTRAMUSCULAR | Status: DC
  Administered 2015-03-17 – 2015-03-19 (×3): 40 mg via INTRAVENOUS
  Filled 2015-03-17 (×3): qty 4

## 2015-03-17 MED ORDER — POTASSIUM CHLORIDE CRYS ER 20 MEQ PO TBCR
20.0000 meq | EXTENDED_RELEASE_TABLET | Freq: Every day | ORAL | Status: DC
  Administered 2015-03-17 – 2015-03-20 (×4): 20 meq via ORAL
  Filled 2015-03-17 (×4): qty 1

## 2015-03-17 MED ORDER — WITCH HAZEL-GLYCERIN EX PADS
MEDICATED_PAD | CUTANEOUS | Status: DC | PRN
  Administered 2015-03-17: 15:00:00 via TOPICAL
  Filled 2015-03-17: qty 100

## 2015-03-17 MED ORDER — CARVEDILOL 6.25 MG PO TABS
6.2500 mg | ORAL_TABLET | Freq: Two times a day (BID) | ORAL | Status: DC
  Administered 2015-03-17 – 2015-03-18 (×2): 6.25 mg via ORAL
  Filled 2015-03-17 (×2): qty 1

## 2015-03-17 NOTE — Evaluation (Signed)
Physical Therapy Evaluation Patient Details Name: Rachel Baker MRN: [id number] DOB: [birth year] Today's Date: 03/17/2015   History of Present Illness  pt presents with CAP and recent flu.  pt with hx of Carpal Tunnel Release and Cataract Extraction as only PMH.    Clinical Impression  Pt very motivated to improve mobility and maintain independence, but overall limited by SOB.  Feel as pt progresses she will not need PT f/u at D/C, but will follow acutely.      Follow Up Recommendations No PT follow up;Supervision for mobility/OOB    Equipment Recommendations  None recommended by PT    Recommendations for Other Services       Precautions / Restrictions Precautions Precautions: None Restrictions Weight Bearing Restrictions: No      Mobility  Bed Mobility Overal bed mobility: Modified Independent             General bed mobility comments: pt able to return to supine without A.  Did not observe coming to sitting.    Transfers Overall transfer level: Needs assistance Equipment used: None Transfers: Sit to/from Stand Sit to Stand: Supervision         General transfer comment: Definite use of UEs.    Ambulation/Gait Ambulation/Gait assistance: Min guard Ambulation Distance (Feet): 80 Feet Assistive device: None Gait Pattern/deviations: Step-through pattern;Decreased stride length     General Gait Details: pt labored and indicates very fatigued by ambulation and having to wear droplet mask in hallway.  pt on 2L O2 throughout ambulation.  Stairs            Wheelchair Mobility    Modified Rankin (Stroke Patients Only)       Balance Overall balance assessment: No apparent balance deficits (not formally assessed)                                           Pertinent Vitals/Pain Pain Assessment: No/denies pain    Home Living Family/patient expects to be discharged to:: Private residence Living Arrangements: Other relatives  (Sister) Available Help at Discharge: Family;Available PRN/intermittently Type of Home: House Home Access: Stairs to enter   [employer] of Steps: 3 or 4 Home Layout: One level Home Equipment: None      Prior Function Level of Independence: Independent               Hand Dominance        Extremity/Trunk Assessment   Upper Extremity Assessment: Generalized weakness           Lower Extremity Assessment: Generalized weakness      Cervical / Trunk Assessment: Normal  Communication   Communication: No difficulties  Cognition Arousal/Alertness: Awake/alert Behavior During Therapy: WFL for tasks assessed/performed Overall Cognitive Status: Within Functional Limits for tasks assessed                      General Comments      Exercises        Assessment/Plan    PT Assessment Patient needs continued PT services  PT Diagnosis Difficulty walking   PT Problem List Decreased strength;Decreased activity tolerance;Decreased balance;Decreased mobility;Decreased coordination;Decreased knowledge of use of DME;Cardiopulmonary status limiting activity;Obesity  PT Treatment Interventions DME instruction;Gait training;Stair training;Functional mobility training;Therapeutic exercise;Therapeutic activities;Balance training;Patient/family education   PT Goals (Current goals can be found in the Care Plan section) Acute Rehab PT Goals Patient Stated  Goal: Be able to take care of myself. PT Goal Formulation: With patient Time For Goal Achievement: 03/31/15 Potential to Achieve Goals: Good    Frequency Min 3X/week   Barriers to discharge        Co-evaluation               End of Session Equipment Utilized During Treatment: Gait belt;Oxygen Activity Tolerance: Patient limited by fatigue;Treatment limited secondary to medical complications (Comment) (SOB) Patient left: in bed;with call bell/phone within reach Nurse Communication: Mobility  status         Time: 3845-3646 PT Time Calculation (min) (ACUTE ONLY): 23 min   Charges:   PT Evaluation $PT Eval Moderate Complexity: 1 Procedure PT Treatments $Gait Training: 8-22 mins   PT G CodesSunny Schlein, [place] [phone] 03/17/2015, 2:29 PM

## 2015-03-17 NOTE — Progress Notes (Signed)
Patient Name: Rachel Baker Date of Encounter: 03/17/2015   SUBJECTIVE  No chest pain . SOB better.   CURRENT MEDS . aspirin  81 mg Oral Daily  . enoxaparin (LOVENOX) injection  40 mg Subcutaneous Q24H  . furosemide  40 mg Intravenous Daily  . levofloxacin (LEVAQUIN) IV  750 mg Intravenous Q24H  . loratadine  10 mg Oral Daily  . potassium chloride  20 mEq Oral Daily  . vitamin C  2,000 mg Oral Daily    OBJECTIVE  Filed Vitals:   03/16/15 0732 03/16/15 1432 03/16/15 2005 03/17/15 0519  BP: 134/52 136/54 147/59 121/48  Pulse: 88 87 97 89  Temp: 98.7 F (37.1 C) 99.3 F (37.4 C) 99.7 F (37.6 C) 98.7 F (37.1 C)  TempSrc: Oral Oral Oral Oral  Resp: 20 20 20 20   Height:      Weight:    237 lb (107.502 kg)  SpO2: 91% 90% 91% 92%    Intake/Output Summary (Last 24 hours) at 03/17/15 1012 Last data filed at 03/17/15 0900  Gross per 24 hour  Intake    720 ml  Output   1900 ml  Net  -1180 ml   Filed Weights   03/15/15 1620 03/17/15 0519  Weight: 239 lb 8 oz (108.636 kg) 237 lb (107.502 kg)    PHYSICAL EXAM  General: Pleasant, NAD. Neuro: Alert and oriented X 3. Moves all extremities spontaneously. Psych: Normal affect. HEENT:  Normal  Neck: Supple without bruits or JVD. Lungs:  Resp regular and unlabored, CTA. Heart: RRR no s3, s4. Systolic murmurs. Abdomen: Soft, non-tender, non-distended, BS + x 4.  Extremities: No clubbing, cyanosis or edema. DP/PT/Radials 2+ and equal bilaterally. Distinct purplish discoloration of the distal phalanx of the third finger that does not blanch.  Accessory Clinical Findings  CBC  Recent Labs  03/15/15 1219 03/16/15 1158 03/17/15 0309  WBC 11.7* 9.7 10.2  NEUTROABS 9.1*  --   --   HGB 14.1 12.9 13.4  HCT 43.1 39.8 40.8  MCV 91.1 90.7 90.3  PLT 478* 427* 466*   Basic Metabolic Panel  Recent Labs  03/16/15 1158 03/17/15 0309  NA 135 137  K 3.9 4.1  CL 103 103  CO2 21* 22  GLUCOSE 116* 116*  BUN 11 12    CREATININE 0.73 0.78  CALCIUM 8.6* 8.6*   Liver Function Tests  Recent Labs  03/16/15 1158 03/17/15 0309  AST 27 24  ALT 21 22  ALKPHOS 59 59  BILITOT 0.2* 0.4  PROT 5.8* 6.2*  ALBUMIN 2.3* 2.3*   No results for input(s): LIPASE, AMYLASE in the last 72 hours. Cardiac Enzymes  Recent Labs  03/15/15 2029 03/16/15 0232 03/16/15 1158  TROPONINI 0.97* 0.91* 0.61*   Thyroid Function Tests  Recent Labs  03/16/15 1417  TSH 1.955    TELE  Sinus rhythm with PVCs  Echo 03/16/15 LV EF: 65% -  70%  ------------------------------------------------------------------- Indications:   Dyspnea 786.09.  ------------------------------------------------------------------- History:  PMH: Pulmonary edema. Low BNP. Systolic murmur. Cyanosis of finger.  ------------------------------------------------------------------- Study Conclusions  - Left ventricle: The cavity size was normal. Wall thickness was increased in a pattern of severe LVH. Systolic function was vigorous. The estimated ejection fraction was in the range of 65% to 70%. Wall motion was normal; there were no regional wall motion abnormalities. Doppler parameters are consistent with abnormal left ventricular relaxation (grade 1 diastolic dysfunction). Doppler parameters are consistent with high ventricular filling pressure. - Aortic valve:  Valve mobility was restricted. 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 - Mitral valve: Severely calcified annulus. The findings are consistent with mild stenosis. There was mild regurgitation. Valve area by pressure half-time: 1.96 cm^2. - Left atrium: The atrium was moderately dilated. - Pericardium, extracardiac: A trivial pericardial effusion was identified.  Impressions:  - Vigorous LV function; grade 1 diastolic dysfunction with elevated LV filling pressure; severe LVH; intracavitary gradient of 3.3 m/s; moderate LAE; severe MAC with mild MR and  mild MS; moderate AS (mean gradient 20 mmHg).  Radiology/Studies  Dg Chest 2 View  03/15/2015  CLINICAL DATA:  Severe shortness of breath for 1 week EXAM: CHEST  2 VIEW COMPARISON:  None. FINDINGS: Mild cardiac enlargement. Vascular pattern is normal. Multifocal airspace disease throughout the bilateral mid to lower lung zones, left worse than right. No pleural effusion. IMPRESSION: Moderately severe multifocal bilateral nonspecific airspace disease. One consideration would be pneumonia. Electronically Signed   By: Esperanza Heir M.D.   On: 03/15/2015 11:19    ASSESSMENT AND PLAN  1. Acute diastolic CHF - Her chest x-ray is compatible with pulmonary edema. Elevated BNP. Possible CAP.  - Echo showed Vigorous LV function; grade 1 diastolic dysfunction with elevated LV filling pressure; severe LVH; intracavitary gradient of 3.3 m/s; moderate LAE; severe MAC with mild MR and mild MS; moderate AS (mean gradient 20 mmHg).   2. Moderate AS - mean gradient 20 mmHg  3. Mild MS and MI  4. Elevated troponin - No chest pain. Troponin 0.97-->0.91-->0.61.  - Plan for outpatient Myoview.   5. Likely Raynaud's syndrome - No vegetation on echo. Ruled out endocarditis.   6. Possible CAP (community acquired pneumonia) - Per primary   Signed, Bhagat,Bhavinkumar PA-C Pager [phone]  I have seen and examined the patient along with Bhagat,Bhavinkumar PA-C.  I have reviewed the chart, notes and new data.  I agree with PA's note.  Key new complaints: feels she is improving Key examination changes: no change in murmur; 3rd right finger cyanosis is a little better Key new findings / data: normal LVEF, moderate AS, mild MS, high filling pressures  PLAN: Tachycardia will worsen hemodynamic impact of MS. Add beta blocker. Use a combine alpha-beta blocker due to possible Raynaud's sd. Diuretics. Possibly switch to PO diuretics tomorrow. Outpatient nuclear perfusion study.  Thurmon Fair, MD,  [facility] [phone] 03/17/2015, 10:44 AM

## 2015-03-17 NOTE — Plan of Care (Signed)
Problem: Respiratory: Goal: Respiratory status will improve Outcome: Progressing Patient now agreeing to O2 Rachel Baker, patient receiving iv lasix qd to.

## 2015-03-17 NOTE — Progress Notes (Signed)
PATIENT DETAILS Name: Rachel Baker Age: 65 y.o. Sex: female Date of Birth: 12/21/1950 Admit Date: 03/15/2015 Admitting Physician Elease Etienne, MD XKG:YJEHUD,JSHFWY, MD  Brief narrative:  66 year old female with no significant past medical history presented with 2 week history of exertional dyspnea and generalized weakness. Approximately 2 weeks back, patient had a flulike illness. Further evaluation with a chest x-ray showed multifocal lung infiltrates highly suspicious for pneumonia. She was also found to have related troponin levels. She was subsequently admitted for further evaluation and treatment. See below for further details  Subjective: Continues to have shortness of breath-mostly on exertion-essentially unchanged compared to past few days  Assessment/Plan: Active Problems: B/L lung infiltrates-CAP (community acquired pneumonia) vs acute diastolic CHF: Essentially unchanged-only minimal improvement if it best-will empirically continue Levaquin for a total of 5 days, start IV Lasix-will attempt to diurese until renal function permits. Given bluish discoloration of the distal phalanx of the right middle finger-autoimmune causes may need to be considered as well-will send out autoimmune panel. Blood cultures continue to be negative, influenza PCR negative, respiratory virus panel pending. Follow closely.    Elevated troponin level: Trend is flat and not consistent with ACS. Echocardiogram showed a vigorous systolic function with no wall motion abnormalities.Will likely need mucus stress test at some point in the future. Cardiology following.  Systolic murmur:  likely secondary to moderate aortic stenosis. Await further recommendations from cardiology.  Bluish discoloration of the distal phalanx of the right middle finger: tender to touch-radial pulses are strong-? Raynaud's phenomenon-alternatively could have embolic event-however EF on echo is preserved. Blood  cultures continue to be negative. ANA pending-TSH/HBsAg/anti-HCV/HIV are negative. We will send out further autoimmune workup.  On numerous over-the-counter herbal supplements-not sure of these could account for this as well.   Disposition: Remain inpatient  Antimicrobial agents  See below  Anti-infectives    Start     Dose/Rate Route Frequency Ordered Stop   03/16/15 1500  levofloxacin (LEVAQUIN) IVPB 750 mg     750 mg 100 mL/hr over 90 Minutes Intravenous Every 24 hours 03/15/15 1657 03/21/15 1459   03/15/15 1345  levofloxacin (LEVAQUIN) IVPB 750 mg     750 mg 100 mL/hr over 90 Minutes Intravenous  Once 03/15/15 1330 03/15/15 1527      DVT Prophylaxis: Prophylactic Lovenox   Code Status: Full code   Family Communication None at bedside  Procedures: None  CONSULTS:  cardiology  Time spent 30 minutes-Greater than 50% of this time was spent in counseling, explanation of diagnosis, planning of further management, and coordination of care.  MEDICATIONS: Scheduled Meds: . aspirin  81 mg Oral Daily  . enoxaparin (LOVENOX) injection  40 mg Subcutaneous Q24H  . furosemide  40 mg Intravenous Daily  . levofloxacin (LEVAQUIN) IV  750 mg Intravenous Q24H  . loratadine  10 mg Oral Daily  . potassium chloride  20 mEq Oral Daily  . vitamin C  2,000 mg Oral Daily   Continuous Infusions:  PRN Meds:.calcium carbonate    PHYSICAL EXAM: Vital signs in last 24 hours: Filed Vitals:   03/16/15 0732 03/16/15 1432 03/16/15 2005 03/17/15 0519  BP: 134/52 136/54 147/59 121/48  Pulse: 88 87 97 89  Temp: 98.7 F (37.1 C) 99.3 F (37.4 C) 99.7 F (37.6 C) 98.7 F (37.1 C)  TempSrc: Oral Oral Oral Oral  Resp: 20 20 20 20   Height:  Weight:    107.502 kg (237 lb)  SpO2: 91% 90% 91% 92%    Weight change: -1.134 kg (-2 lb 8 oz) Filed Weights   03/15/15 1620 03/17/15 0519  Weight: 108.636 kg (239 lb 8 oz) 107.502 kg (237 lb)   Body mass index is 41.99 kg/(m^2).   Gen  Exam: Awake and alert with clear speech.  Neck: Supple, No JVD.   Chest: Moving air-bibasilar rales CVS: S1 S2 Regular, + systolic murmur.  Abdomen: soft, BS +, non tender, non distended.  Extremities: no edema, lower extremities warm to touch. Neurologic: Non Focal.   Skin: No Rash.   Wounds: N/A.     Intake/Output from previous day:  Intake/Output Summary (Last 24 hours) at 03/17/15 1016 Last data filed at 03/17/15 0900  Gross per 24 hour  Intake    720 ml  Output   1900 ml  Net  -1180 ml     LAB RESULTS: CBC  Recent Labs Lab 03/15/15 1219 03/16/15 1158 03/17/15 0309  WBC 11.7* 9.7 10.2  HGB 14.1 12.9 13.4  HCT 43.1 39.8 40.8  PLT 478* 427* 466*  MCV 91.1 90.7 90.3  MCH 29.8 29.4 29.6  MCHC 32.7 32.4 32.8  RDW 13.0 13.2 13.2  LYMPHSABS 1.1  --   --   MONOABS 1.0  --   --   EOSABS 0.1  --   --   BASOSABS 0.0  --   --     Chemistries   Recent Labs Lab 03/15/15 1219 03/16/15 1158 03/17/15 0309  NA 136 135 137  K 4.1 3.9 4.1  CL 101 103 103  CO2 21* 21* 22  GLUCOSE 109* 116* 116*  BUN 11 11 12   CREATININE 0.95 0.73 0.78  CALCIUM 9.2 8.6* 8.6*    CBG: No results for input(s): GLUCAP in the last 168 hours.  GFR Estimated Creatinine Clearance: 82.3 mL/min (by C-G formula based on Cr of 0.78).  Coagulation profile No results for input(s): INR, PROTIME in the last 168 hours.  Cardiac Enzymes  Recent Labs Lab 03/15/15 2029 03/16/15 0232 03/16/15 1158  TROPONINI 0.97* 0.91* 0.61*    Invalid input(s): POCBNP No results for input(s): DDIMER in the last 72 hours. No results for input(s): HGBA1C in the last 72 hours. No results for input(s): CHOL, HDL, LDLCALC, TRIG, CHOLHDL, LDLDIRECT in the last 72 hours.  Recent Labs  03/16/15 1417  TSH 1.955   No results for input(s): VITAMINB12, FOLATE, FERRITIN, TIBC, IRON, RETICCTPCT in the last 72 hours. No results for input(s): LIPASE, AMYLASE in the last 72 hours.  Urine Studies No results  for input(s): UHGB, CRYS in the last 72 hours.  Invalid input(s): UACOL, UAPR, USPG, UPH, UTP, UGL, UKET, UBIL, UNIT, UROB, ULEU, UEPI, UWBC, URBC, UBAC, CAST, UCOM, BILUA  MICROBIOLOGY: Recent Results (from the past 240 hour(s))  Culture, blood (routine x 2) Call MD if unable to obtain prior to antibiotics being given     Status: None (Preliminary result)   Collection Time: 03/15/15  8:29 PM  Result Value Ref Range Status   Specimen Description BLOOD LEFT ANTECUBITAL  Final   Special Requests IN PEDIATRIC BOTTLE 4CC  Final   Culture NO GROWTH < 12 HOURS  Final   Report Status PENDING  Incomplete  Culture, blood (routine x 2) Call MD if unable to obtain prior to antibiotics being given     Status: None (Preliminary result)   Collection Time: 03/15/15  8:34 PM  Result  Value Ref Range Status   Specimen Description BLOOD RIGHT HAND  Final   Special Requests BOTTLES DRAWN AEROBIC ONLY 5CC  Final   Culture NO GROWTH < 12 HOURS  Final   Report Status PENDING  Incomplete    RADIOLOGY STUDIES/RESULTS: Dg Chest 2 View  03/15/2015  CLINICAL DATA:  Severe shortness of breath for 1 week EXAM: CHEST  2 VIEW COMPARISON:  None. FINDINGS: Mild cardiac enlargement. Vascular pattern is normal. Multifocal airspace disease throughout the bilateral mid to lower lung zones, left worse than right. No pleural effusion. IMPRESSION: Moderately severe multifocal bilateral nonspecific airspace disease. One consideration would be pneumonia. Electronically Signed   By: Esperanza Heir M.D.   On: 03/15/2015 11:19    Jeoffrey Massed, MD  [facility] Pager:[phone]  If 7PM-7AM, please contact night-coverage [email or web address] Password [email or web address] 03/17/2015, 10:16 AM   LOS: 2 days

## 2015-03-17 NOTE — Progress Notes (Signed)
Small amount of smeared blood noted on bed pad.  Patient stated its from here hemorrhoids.  I notified Dr. Jerral Ralph.  Order received for tucks pads prn.

## 2015-03-18 ENCOUNTER — Inpatient Hospital Stay (HOSPITAL_COMMUNITY): Payer: Medicare HMO

## 2015-03-18 DIAGNOSIS — I7389 Other specified peripheral vascular diseases: Secondary | ICD-10-CM | POA: Insufficient documentation

## 2015-03-18 DIAGNOSIS — J181 Lobar pneumonia, unspecified organism: Secondary | ICD-10-CM | POA: Diagnosis present

## 2015-03-18 LAB — PROCALCITONIN: Procalcitonin: <0.1 ng/mL

## 2015-03-18 LAB — LEGIONELLA ANTIGEN, URINE

## 2015-03-18 LAB — BASIC METABOLIC PANEL
Anion gap: 11 (ref 5–15)
BUN: 13 mg/dL (ref 6–20)
CALCIUM: 8.6 mg/dL — AB (ref 8.9–10.3)
CHLORIDE: 103 mmol/L (ref 101–111)
CO2: 21 mmol/L — ABNORMAL LOW (ref 22–32)
CREATININE: 0.71 mg/dL (ref 0.44–1.00)
GFR calc non Af Amer: >60 mL/min (ref >60)
Glucose, Bld: 107 mg/dL — ABNORMAL HIGH (ref 65–99)
Potassium: 3.9 mmol/L (ref 3.5–5.1)
SODIUM: 135 mmol/L (ref 135–145)

## 2015-03-18 LAB — ANTINUCLEAR ANTIBODIES, IFA: ANTINUCLEAR ANTIBODIES, IFA: NEGATIVE

## 2015-03-18 MED ORDER — LEVOFLOXACIN 750 MG PO TABS
750.0000 mg | ORAL_TABLET | Freq: Every day | ORAL | Status: AC
  Administered 2015-03-18 – 2015-03-19 (×2): 750 mg via ORAL
  Filled 2015-03-18 (×2): qty 1

## 2015-03-18 MED ORDER — CARVEDILOL 12.5 MG PO TABS
12.5000 mg | ORAL_TABLET | Freq: Two times a day (BID) | ORAL | Status: DC
  Administered 2015-03-18 – 2015-03-19 (×2): 12.5 mg via ORAL
  Filled 2015-03-18 (×2): qty 1

## 2015-03-18 NOTE — Progress Notes (Signed)
Physical Therapy Treatment Patient Details Name: Rachel Baker MRN: [id number] DOB: [birth year] Today's Date: 03/18/2015    History of Present Illness pt presents with CAP and recent flu.  pt with hx of Carpal Tunnel Release and Cataract Extraction as only PMH.      PT Comments    Pt remains SHOB but able to advance gait distance with chair follow.  Pt educated on pacing and pursed lip breathing.  O2 sats dropped to 84% on 2L required 4L to improve O2 sats greater than 90%.    Follow Up Recommendations  No PT follow up;Supervision for mobility/OOB     Equipment Recommendations  None recommended by PT    Recommendations for Other Services       Precautions / Restrictions Precautions Precautions: None Restrictions Weight Bearing Restrictions: No    Mobility  Bed Mobility Overal bed mobility: Modified Independent                Transfers Overall transfer level: Modified independent   Transfers: Sit to/from Stand Sit to Stand: Modified independent (Device/Increase time)         General transfer comment: perform rocking motion to create momentum but safe in technique.    Ambulation/Gait Ambulation/Gait assistance: Min guard Ambulation Distance (Feet): 60 Feet (146ft on 2nd trial.) Assistive device: None Gait Pattern/deviations: Staggering left;Staggering right;Step-through pattern;Decreased stride length     General Gait Details: Pt remains labored and presents with decreased O2 sats.  Pt performed increased gait training with chair follow.  Mask remains to irritate patients breathing in halls.  Pt performed slow cadenece requiring to seated rest periods during intervention   Stairs            Wheelchair Mobility    Modified Rankin (Stroke Patients Only)       Balance Overall balance assessment: Needs assistance         Standing balance support: No upper extremity supported Standing balance-Leahy Scale: Fair                       Cognition Arousal/Alertness: Awake/alert Behavior During Therapy: WFL for tasks assessed/performed Overall Cognitive Status: Within Functional Limits for tasks assessed                      Exercises      General Comments        Pertinent Vitals/Pain Pain Assessment: No/denies pain    Home Living                      Prior Function            PT Goals (current goals can now be found in the care plan section) Acute Rehab PT Goals Patient Stated Goal: Be able to take care of myself. Potential to Achieve Goals: Good Progress towards PT goals: Progressing toward goals    Frequency  Min 3X/week    PT Plan      Co-evaluation             End of Session Equipment Utilized During Treatment: Gait belt;Oxygen (chair follow) Activity Tolerance: Patient limited by fatigue;Treatment limited secondary to medical complications (Comment) (dyspnea) Patient left: in chair;with call bell/phone within reach     Time: 9767-3419 PT Time Calculation (min) (ACUTE ONLY): 25 min  Charges:  $Gait Training: 23-37 mins                    G  Codes:      Florestine Avers 03/18/2015, 3:48 PM  Joycelyn Rua, PTA pager [phone]

## 2015-03-18 NOTE — Progress Notes (Signed)
   03/18/15 1341  PT Visit Information  Reason Eval/Treat Not Completed Patient at procedure or test/unavailable (will continue efforts.  )

## 2015-03-18 NOTE — Progress Notes (Signed)
VASCULAR LAB PRELIMINARY  PRELIMINARY  PRELIMINARY  PRELIMINARY  VASCULAR LAB PRELIMINARY RESULTS  Upper Extremity Arterial completed    RIGHT   LEFT    VELOCITY WAVEFORM  Velocity WAVEFORM  SUBCLAVIAN 141 Triphasic SUBCLAVIAN 113 Triphasic  AXILLARY 151 Triphasic     BRACHIAL 93 Triphasic     RADIAL 135 Triphasic     ULNAR 106 Triphasic      Duplex scn of the right  Upper extremity revealed velocities within normal limits and normal Doppler wavefroms  Teddrick Mallari RVS 03/18/2015 2:26 PM

## 2015-03-18 NOTE — Progress Notes (Signed)
Pt still becomes very SOB with minimal exertion.  Recovers with rest.  Pt has been agreeable to wear O2 @ 2L/[place] and is tolerating well. Will continue to monitor. Dierdre Highman, RN

## 2015-03-18 NOTE — Progress Notes (Signed)
Patient Name: Rachel Baker Date of Encounter: 03/18/2015  Active Problems:   CAP (community acquired pneumonia)   Pneumonia   Elevated troponin   PNA (pneumonia)   Acute diastolic heart failure (HCC)   Aortic stenosis   Mitral stenosis   Length of Stay: 3  SUBJECTIVE  Good response to diuretics. Despite this, chest xray infiltrates are at least as widespread as on arrival. Dyspneic walking to bathroom   CURRENT MEDS . aspirin  81 mg Oral Daily  . carvedilol  6.25 mg Oral BID WC  . enoxaparin (LOVENOX) injection  40 mg Subcutaneous Q24H  . furosemide  40 mg Intravenous Daily  . levofloxacin (LEVAQUIN) IV  750 mg Intravenous Q24H  . loratadine  10 mg Oral Daily  . potassium chloride  20 mEq Oral Daily  . vitamin C  2,000 mg Oral Daily    OBJECTIVE   Intake/Output Summary (Last 24 hours) at 03/18/15 1131 Last data filed at 03/18/15 0914  Gross per 24 hour  Intake    960 ml  Output   4050 ml  Net  -3090 ml   Filed Weights   03/15/15 1620 03/17/15 0519 03/18/15 0442  Weight: 108.636 kg (239 lb 8 oz) 107.502 kg (237 lb) 106.3 kg (234 lb 5.6 oz)    PHYSICAL EXAM Filed Vitals:   03/17/15 1318 03/17/15 1821 03/17/15 2118 03/18/15 0442  BP: 134/52 121/51 109/56 111/56  Pulse: 82 95  84  Temp: 98.2 F (36.8 C)  98.7 F (37.1 C) 98.9 F (37.2 C)  TempSrc: Oral     Resp: 20  19 18   Height:      Weight:    106.3 kg (234 lb 5.6 oz)  SpO2: 95%  95% 90%   General: Alert, oriented x3, no distress Head: no evidence of trauma, PERRL, EOMI, no exophtalmos or lid lag, no myxedema, no xanthelasma; normal ears, nose and oropharynx Neck: normal jugular venous pulsations and no hepatojugular reflux; brisk carotid pulses without delay and no carotid bruits Chest: basilar crackles R>L, no signs of consolidation by percussion or palpation, normal fremitus, symmetrical and full respiratory excursions Cardiovascular: normal position and quality of the apical impulse, regular  rhythm, normal first and second heart sounds, no rubs or gallops, 2/6 systolic ejection murmur Abdomen: no tenderness or distention, no masses by palpation, no abnormal pulsatility or arterial bruits, normal bowel sounds, no hepatosplenomegaly Extremities: no clubbing or edema; 2+ radial, ulnar and brachial pulses bilaterally; 2+ right femoral, posterior tibial and dorsalis pedis pulses; 2+ left femoral, posterior tibial and dorsalis pedis pulses; no subclavian or femoral bruits Persistent cyanosis and mild tenderness of 3rd right finger distal phalanx, improved slightly Neurological: grossly nonfocal  LABS  CBC  Recent Labs  03/15/15 1219 03/16/15 1158 03/17/15 0309  WBC 11.7* 9.7 10.2  NEUTROABS 9.1*  --   --   HGB 14.1 12.9 13.4  HCT 43.1 39.8 40.8  MCV 91.1 90.7 90.3  PLT 478* 427* 466*   Basic Metabolic Panel  Recent Labs  03/17/15 0309 03/18/15 0440  NA 137 135  K 4.1 3.9  CL 103 103  CO2 22 21*  GLUCOSE 116* 107*  BUN 12 13  CREATININE 0.78 0.71  CALCIUM 8.6* 8.6*   Liver Function Tests  Recent Labs  03/16/15 1158 03/17/15 0309  AST 27 24  ALT 21 22  ALKPHOS 59 59  BILITOT 0.2* 0.4  PROT 5.8* 6.2*  ALBUMIN 2.3* 2.3*   No results for input(s): LIPASE,  AMYLASE in the last 72 hours. Cardiac Enzymes  Recent Labs  03/15/15 2029 03/16/15 0232 03/16/15 1158  TROPONINI 0.97* 0.91* 0.61*   Thyroid Function Tests  Recent Labs  03/16/15 1417  TSH 1.955    Radiology Studies Imaging results have been reviewed and Dg Chest 2 View  03/18/2015  CLINICAL DATA:  Shortness of breath and cough since Friday, former smoker EXAM: CHEST  2 VIEW COMPARISON:  03/15/2015 FINDINGS: Enlargement of cardiac silhouette. Stable mediastinal contours. Diffuse BILATERAL airspace infiltrates, increased at RIGHT lower lobe versus previous study. Remaining lungs grossly unchanged. No definite pleural effusion or pneumothorax. Degenerative disc disease changes thoracic spine.  IMPRESSION: Diffuse BILATERAL airspace infiltrates question pneumonia, increased in RIGHT lower lobe since previous exam. Electronically Signed   By: Ulyses Southward M.D.   On: 03/18/2015 08:10    TELE NSR  ASSESSMENT AND PLAN  1. Acute diastolic heart failure: I have reviewed the echo images and there is clear evidence of elevated filling pressures (notwithstanding the relatively low BNP level - obesity-related?). Despite good diuresis, still has rales. Exam for volume status is impeded by obesity. Will continue diuretics.  2. AS - while there are clear degenerative changes, I believe there is a component of subvalvular obstruction. HOCM? Hard to discern relative contribution of valvular versus subvalvular outflow obstruction. A TEE might help discern the two components. Not ready for TEE until respiratory status improves. Increase beta blocker.  3. MS - mild, but also should have better hemodynamics with beta blocker  4. Superimposed pneumonia? She mentions concerns about exposure to cat feces/cat litter as  Possible problem. Not sure how that fits in. Hypersensitivity pneumonitis?  5. Cyanotic 3 rd right finger - embolic lesion? Would expect Raynaud's to be more dynamic and less tender. TEE would also be useful to exclude cardioembolic source. Suspicion for endocarditis is low in the absence of fever. Could have had paroxysmal atrial fibrillation (moderate left atrial dilatation)?   Thurmon Fair, MD, [facility] [phone] office [phone] pager 03/18/2015 11:31 AM

## 2015-03-18 NOTE — Progress Notes (Signed)
PATIENT DETAILS Name: Rachel Baker Age: 65 y.o. Sex: female Date of Birth: 1950/08/20 Admit Date: 03/15/2015 Admitting Physician Elease Etienne, MD EPP:IRJJOA,CZYSAY, MD  Brief narrative:  65 year old female with no significant past medical history presented with 2 week history of exertional dyspnea and generalized weakness. Approximately 2 weeks back, patient had a flulike illness. Further evaluation with a chest x-ray showed multifocal lung infiltrates highly suspicious for pneumonia. She was also found to have related troponin levels. She was subsequently admitted for further evaluation and treatment. See below for further details  Subjective: Feels slightly improved -Continues to have shortness of breath-mostly on exertion  Assessment/Plan: Active Problems: B/L lung infiltrates-CAP (community acquired pneumonia) vs acute diastolic CHF: Essentially unchanged-only minimal improvement if it best-will empirically continue Levaquin for a total of 5 days, continue IV Lasix-will attempt to diurese until renal function permits. Given bluish discoloration of the distal phalanx of the right middle finger-autoimmune causes may need to be considered as well-will send out autoimmune panel. Blood cultures continue to be negative, influenza PCR negative, respiratory virus panel pending. Follow closely.    Elevated troponin level: Trend is flat and not consistent with ACS. Echocardiogram showed a vigorous systolic function with no wall motion abnormalities.Will likely need nuclear stress test at some point in the future. Cardiology following.  Systolic murmur:  likely secondary to moderate aortic stenosis. Await further recommendations from cardiology.  Acrocyanosis of the distal phalanx of the right middle finger: tender to touch-radial pulses are strong-? Raynaud's phenomenon-alternatively could have embolic event-however EF on echo is preserved. Blood cultures continue to be  negative. ANA pending-TSH/HBsAg/anti-HCV/HIV are negative. Further autoimmune workup is pending. Right upper extremity duplex currently pending. On numerous over-the-counter herbal supplements-not sure of these could account for this as well.   Morbid obesity: Counseled regarding importance of weight loss  Disposition: Remain inpatient  Antimicrobial agents  See below  Anti-infectives    Start     Dose/Rate Route Frequency Ordered Stop   03/18/15 1400  levofloxacin (LEVAQUIN) tablet 750 mg     750 mg Oral Daily 03/18/15 1131 03/21/15 1359   03/16/15 1500  levofloxacin (LEVAQUIN) IVPB 750 mg  Status:  Discontinued     750 mg 100 mL/hr over 90 Minutes Intravenous Every 24 hours 03/15/15 1657 03/18/15 1131   03/15/15 1345  levofloxacin (LEVAQUIN) IVPB 750 mg     750 mg 100 mL/hr over 90 Minutes Intravenous  Once 03/15/15 1330 03/15/15 1527      DVT Prophylaxis: Prophylactic Lovenox   Code Status: Full code   Family Communication None at bedside  Procedures: None  CONSULTS:  cardiology  Time spent 30 minutes-Greater than 50% of this time was spent in counseling, explanation of diagnosis, planning of further management, and coordination of care.  MEDICATIONS: Scheduled Meds: . aspirin  81 mg Oral Daily  . carvedilol  12.5 mg Oral BID WC  . enoxaparin (LOVENOX) injection  40 mg Subcutaneous Q24H  . furosemide  40 mg Intravenous Daily  . levofloxacin  750 mg Oral Daily  . loratadine  10 mg Oral Daily  . potassium chloride  20 mEq Oral Daily  . vitamin C  2,000 mg Oral Daily   Continuous Infusions:  PRN Meds:.calcium carbonate, witch hazel-glycerin    PHYSICAL EXAM: Vital signs in last 24 hours: Filed Vitals:   03/17/15 1318 03/17/15 1821 03/17/15 2118 03/18/15 0442  BP: 134/52 121/51 109/56  111/56  Pulse: 82 95  84  Temp: 98.2 F (36.8 C)  98.7 F (37.1 C) 98.9 F (37.2 C)  TempSrc: Oral     Resp: 20  19 18   Height:      Weight:    106.3 kg (234 lb 5.6  oz)  SpO2: 95%  95% 90%    Weight change: -1.203 kg (-2 lb 10.4 oz) Filed Weights   03/15/15 1620 03/17/15 0519 03/18/15 0442  Weight: 108.636 kg (239 lb 8 oz) 107.502 kg (237 lb) 106.3 kg (234 lb 5.6 oz)   Body mass index is 41.52 kg/(m^2).   Gen Exam: Awake and alert with clear speech.  Neck: Supple, No JVD.   Chest: Moving air-bibasilar rales CVS: S1 S2 Regular, + systolic murmur.  Abdomen: soft, BS +, non tender, non distended.  Extremities: no edema, lower extremities warm to touch. Neurologic: Non Focal.   Skin: No Rash.   Wounds: N/A.     Intake/Output from previous day:  Intake/Output Summary (Last 24 hours) at 03/18/15 1234 Last data filed at 03/18/15 0914  Gross per 24 hour  Intake    960 ml  Output   3250 ml  Net  -2290 ml     LAB RESULTS: CBC  Recent Labs Lab 03/15/15 1219 03/16/15 1158 03/17/15 0309  WBC 11.7* 9.7 10.2  HGB 14.1 12.9 13.4  HCT 43.1 39.8 40.8  PLT 478* 427* 466*  MCV 91.1 90.7 90.3  MCH 29.8 29.4 29.6  MCHC 32.7 32.4 32.8  RDW 13.0 13.2 13.2  LYMPHSABS 1.1  --   --   MONOABS 1.0  --   --   EOSABS 0.1  --   --   BASOSABS 0.0  --   --     Chemistries   Recent Labs Lab 03/15/15 1219 03/16/15 1158 03/17/15 0309 03/18/15 0440  NA 136 135 137 135  K 4.1 3.9 4.1 3.9  CL 101 103 103 103  CO2 21* 21* 22 21*  GLUCOSE 109* 116* 116* 107*  BUN 11 11 12 13   CREATININE 0.95 0.73 0.78 0.71  CALCIUM 9.2 8.6* 8.6* 8.6*    CBG: No results for input(s): GLUCAP in the last 168 hours.  GFR Estimated Creatinine Clearance: 81.9 mL/min (by C-G formula based on Cr of 0.71).  Coagulation profile No results for input(s): INR, PROTIME in the last 168 hours.  Cardiac Enzymes  Recent Labs Lab 03/15/15 2029 03/16/15 0232 03/16/15 1158  TROPONINI 0.97* 0.91* 0.61*    Invalid input(s): POCBNP No results for input(s): DDIMER in the last 72 hours. No results for input(s): HGBA1C in the last 72 hours. No results for input(s):  CHOL, HDL, LDLCALC, TRIG, CHOLHDL, LDLDIRECT in the last 72 hours.  Recent Labs  03/16/15 1417  TSH 1.955   No results for input(s): VITAMINB12, FOLATE, FERRITIN, TIBC, IRON, RETICCTPCT in the last 72 hours. No results for input(s): LIPASE, AMYLASE in the last 72 hours.  Urine Studies No results for input(s): UHGB, CRYS in the last 72 hours.  Invalid input(s): UACOL, UAPR, USPG, UPH, UTP, UGL, UKET, UBIL, UNIT, UROB, ULEU, UEPI, UWBC, URBC, UBAC, CAST, UCOM, BILUA  MICROBIOLOGY: Recent Results (from the past 240 hour(s))  Culture, blood (routine x 2) Call MD if unable to obtain prior to antibiotics being given     Status: None (Preliminary result)   Collection Time: 03/15/15  8:29 PM  Result Value Ref Range Status   Specimen Description BLOOD LEFT ANTECUBITAL  Final  Special Requests IN PEDIATRIC BOTTLE 4CC  Final   Culture NO GROWTH 2 DAYS  Final   Report Status PENDING  Incomplete  Culture, blood (routine x 2) Call MD if unable to obtain prior to antibiotics being given     Status: None (Preliminary result)   Collection Time: 03/15/15  8:34 PM  Result Value Ref Range Status   Specimen Description BLOOD RIGHT HAND  Final   Special Requests BOTTLES DRAWN AEROBIC ONLY 5CC  Final   Culture NO GROWTH 2 DAYS  Final   Report Status PENDING  Incomplete    RADIOLOGY STUDIES/RESULTS: Dg Chest 2 View  03/18/2015  CLINICAL DATA:  Shortness of breath and cough since Friday, former smoker EXAM: CHEST  2 VIEW COMPARISON:  03/15/2015 FINDINGS: Enlargement of cardiac silhouette. Stable mediastinal contours. Diffuse BILATERAL airspace infiltrates, increased at RIGHT lower lobe versus previous study. Remaining lungs grossly unchanged. No definite pleural effusion or pneumothorax. Degenerative disc disease changes thoracic spine. IMPRESSION: Diffuse BILATERAL airspace infiltrates question pneumonia, increased in RIGHT lower lobe since previous exam. Electronically Signed   By: Ulyses Southward M.D.    On: 03/18/2015 08:10   Dg Chest 2 View  03/15/2015  CLINICAL DATA:  Severe shortness of breath for 1 week EXAM: CHEST  2 VIEW COMPARISON:  None. FINDINGS: Mild cardiac enlargement. Vascular pattern is normal. Multifocal airspace disease throughout the bilateral mid to lower lung zones, left worse than right. No pleural effusion. IMPRESSION: Moderately severe multifocal bilateral nonspecific airspace disease. One consideration would be pneumonia. Electronically Signed   By: Esperanza Heir M.D.   On: 03/15/2015 11:19    Jeoffrey Massed, MD  [facility] Pager:[phone]  If 7PM-7AM, please contact night-coverage [email or web address] Password [facility] 03/18/2015, 12:34 PM   LOS: 3 days

## 2015-03-18 NOTE — Clinical Documentation Improvement (Signed)
Internal Medicine  Possible Conditions?   Identify Type - morbid obesity, obesity (link the BMI to condition e.g. morbid obesity with BMI of 47), overweight, with alveolar hypoventilation (Pickwickian Syndrome)  Other  Clinically Undetermined  Document any associated diagnoses/conditions.  Please update your documentation within the medical record to reflect your response to this query. Thank you.  Supporting Information:(As per notes)Body mass index is 42.44 kg/(m^2)  5'3" &  239.8 lbs  Please exercise your independent, professional judgment when responding. A specific answer is not anticipated or expected.  Thank You, Nevin Bloodgood, RN, BSN, CCDS,Clinical Documentation Specialist:  [phone]  [phone]=[facility] [place]- Health Information Management

## 2015-03-19 DIAGNOSIS — Z6841 Body Mass Index (BMI) 40.0 and over, adult: Secondary | ICD-10-CM

## 2015-03-19 LAB — RESPIRATORY VIRUS PANEL
ADENOVIRUS: NEGATIVE
INFLUENZA A: NEGATIVE
Influenza B: NEGATIVE
Metapneumovirus: NEGATIVE
PARAINFLUENZA 1 A: NEGATIVE
PARAINFLUENZA 2 A: NEGATIVE
Parainfluenza 3: NEGATIVE
RESPIRATORY SYNCYTIAL VIRUS B: NEGATIVE
RHINOVIRUS: NEGATIVE
Respiratory Syncytial Virus A: NEGATIVE

## 2015-03-19 LAB — BASIC METABOLIC PANEL
Anion gap: 16 — ABNORMAL HIGH (ref 5–15)
BUN: 16 mg/dL (ref 6–20)
CHLORIDE: 99 mmol/L — AB (ref 101–111)
CO2: 20 mmol/L — AB (ref 22–32)
CREATININE: 0.71 mg/dL (ref 0.44–1.00)
Calcium: 9.1 mg/dL (ref 8.9–10.3)
GFR calc non Af Amer: >60 mL/min (ref >60)
GLUCOSE: 112 mg/dL — AB (ref 65–99)
Potassium: 4.5 mmol/L (ref 3.5–5.1)
Sodium: 135 mmol/L (ref 135–145)

## 2015-03-19 MED ORDER — METOPROLOL TARTRATE 50 MG PO TABS
50.0000 mg | ORAL_TABLET | Freq: Two times a day (BID) | ORAL | Status: DC
  Administered 2015-03-19 – 2015-03-20 (×2): 50 mg via ORAL
  Filled 2015-03-19 (×3): qty 1

## 2015-03-19 NOTE — Progress Notes (Signed)
Patient Name: Rachel Baker Date of Encounter: 03/19/2015  Active Problems:   CAP (community acquired pneumonia)   Elevated troponin   Acute diastolic heart failure (HCC)   Aortic stenosis   Mitral stenosis   Lobar pneumonia (HCC)   Morbid obesity with BMI of 40.0-44.9, adult (HCC)   Length of Stay: 4  SUBJECTIVE  Good response to diuretics. Despite this, chest xray infiltrates are at least as widespread as on arrival. Dyspneic walking to bathroom   CURRENT MEDS . aspirin  81 mg Oral Daily  . carvedilol  12.5 mg Oral BID WC  . enoxaparin (LOVENOX) injection  40 mg Subcutaneous Q24H  . furosemide  40 mg Intravenous Daily  . levofloxacin  750 mg Oral Daily  . loratadine  10 mg Oral Daily  . potassium chloride  20 mEq Oral Daily  . vitamin C  2,000 mg Oral Daily    OBJECTIVE   Intake/Output Summary (Last 24 hours) at 03/19/15 1600 Last data filed at 03/19/15 1346  Gross per 24 hour  Intake    720 ml  Output   2450 ml  Net  -1730 ml   Filed Weights   03/17/15 0519 03/18/15 0442 03/19/15 0500  Weight: 237 lb (107.502 kg) 234 lb 5.6 oz (106.3 kg) 234 lb (106.142 kg)    PHYSICAL EXAM Filed Vitals:   03/18/15 2100 03/18/15 2200 03/19/15 0500 03/19/15 1355  BP: 101/47  121/55 97/38  Pulse: 74  79 76  Temp: 98.3 F (36.8 C)  99 F (37.2 C) 98.6 F (37 C)  TempSrc:    Oral  Resp: 21  22 22   Height:      Weight:   234 lb (106.142 kg)   SpO2: 93% 79% 93% 93%   General: Alert, oriented x3, no distress, obese Head: no evidence of trauma, PERRL, EOMI, no exophtalmos or lid lag, no myxedema, no xanthelasma; normal ears, nose and oropharynx Neck: normal jugular venous pulsations and no hepatojugular reflux; brisk carotid pulses without delay and no carotid bruits Chest: basilar Velcro crackles R>L, no signs of consolidation by percussion or palpation, normal fremitus, symmetrical and full respiratory excursions Cardiovascular: normal position and quality of the  apical impulse, regular rhythm, normal first and second heart sounds, no rubs or gallops, 2/6 systolic ejection murmur Abdomen: no tenderness or distention, no masses by palpation, no abnormal pulsatility or arterial bruits, normal bowel sounds, no hepatosplenomegaly Extremities: no clubbing or edema; 2+ radial, ulnar and brachial pulses bilaterally; 2+ right femoral, posterior tibial and dorsalis pedis pulses; 2+ left femoral, posterior tibial and dorsalis pedis pulses; no subclavian or femoral bruits Persistent cyanosis and mild tenderness of 3rd right finger distal phalanx, improved slightly Neurological: grossly nonfocal  LABS  CBC  Recent Labs  03/17/15 0309  WBC 10.2  HGB 13.4  HCT 40.8  MCV 90.3  PLT 466*   Basic Metabolic Panel  Recent Labs  03/18/15 0440 03/19/15 0804  NA 135 135  K 3.9 4.5  CL 103 99*  CO2 21* 20*  GLUCOSE 107* 112*  BUN 13 16  CREATININE 0.71 0.71  CALCIUM 8.6* 9.1   Liver Function Tests  Recent Labs  03/17/15 0309  AST 24  ALT 22  ALKPHOS 59  BILITOT 0.4  PROT 6.2*  ALBUMIN 2.3*   No results for input(s): LIPASE, AMYLASE in the last 72 hours. Cardiac Enzymes No results for input(s): CKTOTAL, CKMB, CKMBINDEX, TROPONINI in the last 72 hours. Thyroid Function Tests No results for  input(s): TSH, T4TOTAL, T3FREE, THYROIDAB in the last 72 hours.  Invalid input(s): FREET3  Radiology Studies Imaging results have been reviewed and Dg Chest 2 View  03/18/2015  CLINICAL DATA:  Shortness of breath and cough since Friday, former smoker EXAM: CHEST  2 VIEW COMPARISON:  03/15/2015 FINDINGS: Enlargement of cardiac silhouette. Stable mediastinal contours. Diffuse BILATERAL airspace infiltrates, increased at RIGHT lower lobe versus previous study. Remaining lungs grossly unchanged. No definite pleural effusion or pneumothorax. Degenerative disc disease changes thoracic spine. IMPRESSION: Diffuse BILATERAL airspace infiltrates question pneumonia,  increased in RIGHT lower lobe since previous exam. Electronically Signed   By: Ulyses Southward M.D.   On: 03/18/2015 08:10    TELE NSR  ASSESSMENT AND PLAN  1. Acute diastolic heart failure: I have reviewed the echo images and there is clear evidence of elevated filling pressures (notwithstanding the relatively low BNP level - obesity-related?). Despite good diuresis, still has rales. Exam for volume status is impeded by obesity. Will continue diuretics.  2. AS - while there are clear degenerative changes, I believe there is a component of subvalvular obstruction. HOCM? Hard to discern relative contribution of valvular versus subvalvular outflow obstruction. A TEE might help discern the two components. Not ready for TEE until respiratory status improves. Increase beta blocker.  3. MS - mild, but also should have better hemodynamics with beta blocker  4. Superimposed pneumonia? She mentions concerns about exposure to cat feces/cat litter as  Possible problem. Not sure how that fits in. Hypersensitivity pneumonitis?  5. Cyanotic 3 rd right finger - embolic lesion? Would expect Raynaud's to be more dynamic and less tender. TEE would also be useful to exclude cardioembolic source. Suspicion for endocarditis is low in the absence of fever. Could have had paroxysmal atrial fibrillation (moderate left atrial dilatation)?  Plan: MD to see. Possible TEE when stable from pulmonary standpoint.   Corine Shelter PA-C 03/19/2015 4:04 PM  4:00 PM  I have seen and examined the patient along with Corine Shelter PA-C.  I have reviewed the chart, notes and new data.  I agree with PA's note.  Key new complaints: still very dyspneic Key examination changes: bilateral rales R>L  PLAN: Will switch from carvedilol to metoprolol in case she has LVOT dynamic obstruction, since vasodilatory properties may actually be disadvantageous and BP is getting rather low.  Thurmon Fair, MD, [facility] [phone] 03/19/2015, 4:10 PM

## 2015-03-19 NOTE — Progress Notes (Signed)
PATIENT DETAILS Name: Rachel Baker Age: 65 y.o. Sex: female Date of Birth: Sep 19, 1950 Admit Date: 03/15/2015 Admitting Physician Elease Etienne, MD IRJ:[email or web address],SAYTKZ, MD  Brief narrative:  65 year old female with no significant past medical history presented with 2 week history of exertional dyspnea and generalized weakness. Approximately 2 weeks back, patient had a flulike illness. Further evaluation with a chest x-ray showed multifocal lung infiltrates highly suspicious for pneumonia. She was also found to have related troponin levels. She was subsequently admitted for further evaluation and treatment. See below for further details  Subjective: Only minimally improved-continues to have shortness of breath on exertion.  Assessment/Plan: Active Problems: B/L lung infiltrates-CAP (community acquired pneumonia) vs acute diastolic CHF: Essentially unchanged-only minimal improvement if at best-completed a course of empiric Levaquin for 5 days. Empirically started on IV Lasix-plan is today continue as long as renal function permits. -6.3 L so far, weight down to 234 pounds (239 pounds on admission).Given bluish discoloration of the distal phalanx of the right middle finger-autoimmune causes causing ILD may need to be considered as well-autoimmune panel negative so far. Blood cultures continue to be negative, influenza PCR and respiratory virus panel negative, respiratory virus panel pending. Will repeat x-ray of the chest in the next 24-48 hours, suspect that if this is CHF-should see some improvement. Otherwise will need a high-resolution CT scan of the chest.  Elevated troponin level: Trend is flat and not consistent with ACS. Echocardiogram showed a vigorous systolic function with no wall motion abnormalities.Will likely need nuclear stress test at some point in the future. Cardiology following.  Systolic murmur:  likely secondary to moderate aortic stenosis. Await further  recommendations from cardiology.  Acrocyanosis of the distal phalanx of the right middle finger: tender to touch-radial pulses are strong-doubt Raynaud's phenomenon-suspect this could be either small vessel vasculitis or a embolic event. No obvious embolic source on echocardiogram, blood cultures continue to be negative. ANA/dsDNA/Scl 70 negative. TSH/HBsAg/anti-HCV/HIV are negative as well. Cryoglobulin/Anca is pending. Rheumatoid factor slightly elevated, check anti-CCP tomorrow. Right upper extremity duplex shows triphasic waveform. Cardiology contemplating TEE once respiratory status is more stable. On numerous over-the-counter herbal supplements-not sure of these could account for this as well.   Morbid obesity: Counseled regarding importance of weight loss  Disposition: Remain inpatient  Antimicrobial agents  See below  Anti-infectives    Start     Dose/Rate Route Frequency Ordered Stop   03/18/15 1400  levofloxacin (LEVAQUIN) tablet 750 mg     750 mg Oral Daily 03/18/15 1131 03/19/15 2359   03/16/15 1500  levofloxacin (LEVAQUIN) IVPB 750 mg  Status:  Discontinued     750 mg 100 mL/hr over 90 Minutes Intravenous Every 24 hours 03/15/15 1657 03/18/15 1131   03/15/15 1345  levofloxacin (LEVAQUIN) IVPB 750 mg     750 mg 100 mL/hr over 90 Minutes Intravenous  Once 03/15/15 1330 03/15/15 1527      DVT Prophylaxis: Prophylactic Lovenox   Code Status: Full code   Family Communication Spoke to patient's cousin-Dr.Kurien-at patient's request 2/13  Procedures: None  CONSULTS:  cardiology  Time spent 30 minutes-Greater than 50% of this time was spent in counseling, explanation of diagnosis, planning of further management, and coordination of care.  MEDICATIONS: Scheduled Meds: . aspirin  81 mg Oral Daily  . carvedilol  12.5 mg Oral BID WC  . enoxaparin (LOVENOX) injection  40 mg Subcutaneous Q24H  . furosemide  40 mg Intravenous Daily  . levofloxacin  750 mg Oral Daily    . loratadine  10 mg Oral Daily  . potassium chloride  20 mEq Oral Daily  . vitamin C  2,000 mg Oral Daily   Continuous Infusions:  PRN Meds:.calcium carbonate, witch hazel-glycerin    PHYSICAL EXAM: Vital signs in last 24 hours: Filed Vitals:   03/18/15 1541 03/18/15 2100 03/18/15 2200 03/19/15 0500  BP:  101/47  121/55  Pulse:  74  79  Temp:  98.3 F (36.8 C)  99 F (37.2 C)  TempSrc:      Resp:  21  22  Height:      Weight:    106.142 kg (234 lb)  SpO2: 84% 93% 79% 93%    Weight change: -0.158 kg (-5.6 oz) Filed Weights   03/17/15 0519 03/18/15 0442 03/19/15 0500  Weight: 107.502 kg (237 lb) 106.3 kg (234 lb 5.6 oz) 106.142 kg (234 lb)   Body mass index is 41.46 kg/(m^2).   Gen Exam: Awake and alert with clear speech.  Neck: Supple, No JVD.   Chest: Moving air-bibasilar rales CVS: S1 S2 Regular, + systolic murmur.  Abdomen: soft, BS +, non tender, non distended.  Extremities: no edema, lower extremities warm to touch. Neurologic: Non Focal.   Skin: No Rash.   Wounds: N/A.     Intake/Output from previous day:  Intake/Output Summary (Last 24 hours) at 03/19/15 1256 Last data filed at 03/19/15 1045  Gross per 24 hour  Intake    240 ml  Output   2050 ml  Net  -1810 ml     LAB RESULTS: CBC  Recent Labs Lab 03/15/15 1219 03/16/15 1158 03/17/15 0309  WBC 11.7* 9.7 10.2  HGB 14.1 12.9 13.4  HCT 43.1 39.8 40.8  PLT 478* 427* 466*  MCV 91.1 90.7 90.3  MCH 29.8 29.4 29.6  MCHC 32.7 32.4 32.8  RDW 13.0 13.2 13.2  LYMPHSABS 1.1  --   --   MONOABS 1.0  --   --   EOSABS 0.1  --   --   BASOSABS 0.0  --   --     Chemistries   Recent Labs Lab 03/15/15 1219 03/16/15 1158 03/17/15 0309 03/18/15 0440 03/19/15 0804  NA 136 135 137 135 135  K 4.1 3.9 4.1 3.9 4.5  CL 101 103 103 103 99*  CO2 21* 21* 22 21* 20*  GLUCOSE 109* 116* 116* 107* 112*  BUN 11 11 12 13 16   CREATININE 0.95 0.73 0.78 0.71 0.71  CALCIUM 9.2 8.6* 8.6* 8.6* 9.1     CBG: No results for input(s): GLUCAP in the last 168 hours.  GFR Estimated Creatinine Clearance: 81.8 mL/min (by C-G formula based on Cr of 0.71).  Coagulation profile No results for input(s): INR, PROTIME in the last 168 hours.  Cardiac Enzymes  Recent Labs Lab 03/15/15 2029 03/16/15 0232 03/16/15 1158  TROPONINI 0.97* 0.91* 0.61*    Invalid input(s): POCBNP No results for input(s): DDIMER in the last 72 hours. No results for input(s): HGBA1C in the last 72 hours. No results for input(s): CHOL, HDL, LDLCALC, TRIG, CHOLHDL, LDLDIRECT in the last 72 hours.  Recent Labs  03/16/15 1417  TSH 1.955   No results for input(s): VITAMINB12, FOLATE, FERRITIN, TIBC, IRON, RETICCTPCT in the last 72 hours. No results for input(s): LIPASE, AMYLASE in the last 72 hours.  Urine Studies No results for input(s): UHGB, CRYS in the last 72 hours.  Invalid input(s): UACOL, UAPR, USPG, UPH, UTP, UGL, UKET, UBIL, UNIT, UROB, ULEU, UEPI, UWBC, URBC, UBAC, CAST, UCOM, BILUA  MICROBIOLOGY: Recent Results (from the past 240 hour(s))  Culture, blood (routine x 2) Call MD if unable to obtain prior to antibiotics being given     Status: None (Preliminary result)   Collection Time: 03/15/15  8:29 PM  Result Value Ref Range Status   Specimen Description BLOOD LEFT ANTECUBITAL  Final   Special Requests IN PEDIATRIC BOTTLE 4CC  Final   Culture NO GROWTH 3 DAYS  Final   Report Status PENDING  Incomplete  Culture, blood (routine x 2) Call MD if unable to obtain prior to antibiotics being given     Status: None (Preliminary result)   Collection Time: 03/15/15  8:34 PM  Result Value Ref Range Status   Specimen Description BLOOD RIGHT HAND  Final   Special Requests BOTTLES DRAWN AEROBIC ONLY 5CC  Final   Culture NO GROWTH 3 DAYS  Final   Report Status PENDING  Incomplete  Respiratory virus panel     Status: None   Collection Time: 03/16/15 12:30 PM  Result Value Ref Range Status   Respiratory  Syncytial Virus A Negative Negative Final   Respiratory Syncytial Virus B Negative Negative Final   Influenza A Negative Negative Final   Influenza B Negative Negative Final   Parainfluenza 1 Negative Negative Final   Parainfluenza 2 Negative Negative Final   Parainfluenza 3 Negative Negative Final   Metapneumovirus Negative Negative Final   Rhinovirus Negative Negative Final   Adenovirus Negative Negative Final    Comment: (NOTE) Performed At: [facility] [address] [place], [place] [id number] Mila Homer MD W[place]:[id number]     RADIOLOGY STUDIES/RESULTS: Dg Chest 2 View  03/18/2015  CLINICAL DATA:  Shortness of breath and cough since Friday, former smoker EXAM: CHEST  2 VIEW COMPARISON:  03/15/2015 FINDINGS: Enlargement of cardiac silhouette. Stable mediastinal contours. Diffuse BILATERAL airspace infiltrates, increased at RIGHT lower lobe versus previous study. Remaining lungs grossly unchanged. No definite pleural effusion or pneumothorax. Degenerative disc disease changes thoracic spine. IMPRESSION: Diffuse BILATERAL airspace infiltrates question pneumonia, increased in RIGHT lower lobe since previous exam. Electronically Signed   By: Ulyses Southward M.D.   On: 03/18/2015 08:10   Dg Chest 2 View  03/15/2015  CLINICAL DATA:  Severe shortness of breath for 1 week EXAM: CHEST  2 VIEW COMPARISON:  None. FINDINGS: Mild cardiac enlargement. Vascular pattern is normal. Multifocal airspace disease throughout the bilateral mid to lower lung zones, left worse than right. No pleural effusion. IMPRESSION: Moderately severe multifocal bilateral nonspecific airspace disease. One consideration would be pneumonia. Electronically Signed   By: Esperanza Heir M.D.   On: 03/15/2015 11:19    Jeoffrey Massed, MD  [facility] Pager:[phone]  If 7PM-7AM, please contact night-coverage [email or web address] Password [email or web address] 03/19/2015, 12:56 PM   LOS: 4 days

## 2015-03-19 NOTE — Plan of Care (Signed)
Problem: Respiratory: Goal: Respiratory status will improve Outcome: Progressing Pt continues to become very short of breath with limited exertion and requires supplemental oxygen through a nasal cannula.

## 2015-03-19 NOTE — Care Management Important Message (Signed)
Important Message  Patient Details  Name: Rachel Baker MRN: [id number] Date of Birth: 01-31-1951   Medicare Important Message Given:  Yes    Kyla Balzarine 03/19/2015, 4:49 PM

## 2015-03-20 ENCOUNTER — Inpatient Hospital Stay (HOSPITAL_COMMUNITY): Payer: Medicare HMO

## 2015-03-20 DIAGNOSIS — I7389 Other specified peripheral vascular diseases: Secondary | ICD-10-CM

## 2015-03-20 DIAGNOSIS — J9601 Acute respiratory failure with hypoxia: Secondary | ICD-10-CM | POA: Diagnosis present

## 2015-03-20 DIAGNOSIS — R0602 Shortness of breath: Secondary | ICD-10-CM | POA: Diagnosis present

## 2015-03-20 LAB — CBC WITH DIFFERENTIAL/PLATELET
BASOS PCT: 0 %
Basophils Absolute: 0 10*3/uL (ref 0.0–0.1)
EOS ABS: 0.1 10*3/uL (ref 0.0–0.7)
Eosinophils Relative: 0 %
HCT: 39.7 % (ref 36.0–46.0)
HEMOGLOBIN: 12.9 g/dL (ref 12.0–15.0)
Lymphocytes Relative: 6 %
Lymphs Abs: 1.1 10*3/uL (ref 0.7–4.0)
MCH: 29.3 pg (ref 26.0–34.0)
MCHC: 32.5 g/dL (ref 30.0–36.0)
MCV: 90.2 fL (ref 78.0–100.0)
MONOS PCT: 7 %
Monocytes Absolute: 1.4 10*3/uL — ABNORMAL HIGH (ref 0.1–1.0)
NEUTROS PCT: 87 %
Neutro Abs: 17.5 10*3/uL — ABNORMAL HIGH (ref 1.7–7.7)
Platelets: 586 10*3/uL — ABNORMAL HIGH (ref 150–400)
RBC: 4.4 MIL/uL (ref 3.87–5.11)
RDW: 13.3 % (ref 11.5–15.5)
WBC: 20.1 10*3/uL — AB (ref 4.0–10.5)

## 2015-03-20 LAB — TRIGLYCERIDES: Triglycerides: 148 mg/dL (ref <150)

## 2015-03-20 LAB — BASIC METABOLIC PANEL
Anion gap: 11 (ref 5–15)
BUN: 16 mg/dL (ref 6–20)
CALCIUM: 9.2 mg/dL (ref 8.9–10.3)
CO2: 25 mmol/L (ref 22–32)
CREATININE: 0.9 mg/dL (ref 0.44–1.00)
Chloride: 98 mmol/L — ABNORMAL LOW (ref 101–111)
GFR calc Af Amer: >60 mL/min (ref >60)
GLUCOSE: 110 mg/dL — AB (ref 65–99)
POTASSIUM: 4.2 mmol/L (ref 3.5–5.1)
SODIUM: 134 mmol/L — AB (ref 135–145)

## 2015-03-20 LAB — GLUCOSE, CAPILLARY: Glucose-Capillary: 145 mg/dL — ABNORMAL HIGH (ref 65–99)

## 2015-03-20 LAB — CULTURE, BLOOD (ROUTINE X 2)
CULTURE: NO GROWTH
Culture: NO GROWTH

## 2015-03-20 LAB — C-REACTIVE PROTEIN: CRP: 24.1 mg/dL — ABNORMAL HIGH (ref <1.0)

## 2015-03-20 LAB — MRSA PCR SCREENING: MRSA BY PCR: NEGATIVE

## 2015-03-20 LAB — BRAIN NATRIURETIC PEPTIDE: B Natriuretic Peptide: 98 pg/mL (ref 0.0–100.0)

## 2015-03-20 LAB — SEDIMENTATION RATE: Sed Rate: 96 mm/hr — ABNORMAL HIGH (ref 0–22)

## 2015-03-20 LAB — TSH: TSH: 2.691 u[IU]/mL (ref 0.350–4.500)

## 2015-03-20 LAB — PROCALCITONIN: PROCALCITONIN: 0.11 ng/mL

## 2015-03-20 MED ORDER — FENTANYL CITRATE (PF) 100 MCG/2ML IJ SOLN: 50.0000 ug | INTRAMUSCULAR | Status: DC | PRN

## 2015-03-20 MED ORDER — MIDAZOLAM HCL 2 MG/2ML IJ SOLN: 1.0000 mg | INTRAMUSCULAR | Status: DC | PRN

## 2015-03-20 MED ORDER — PROPOFOL 1000 MG/100ML IV EMUL
0.0000 ug/kg/min | INTRAVENOUS | Status: DC
  Administered 2015-03-20: 30 ug/kg/min via INTRAVENOUS
  Administered 2015-03-20: 20 ug/kg/min via INTRAVENOUS
  Administered 2015-03-21: 10 ug/kg/min via INTRAVENOUS
  Administered 2015-03-21: 20 ug/kg/min via INTRAVENOUS
  Administered 2015-03-22: 10 ug/kg/min via INTRAVENOUS
  Administered 2015-03-22 – 2015-03-24 (×4): 15 ug/kg/min via INTRAVENOUS
  Filled 2015-03-20 (×8): qty 100

## 2015-03-20 MED ORDER — FUROSEMIDE 10 MG/ML IJ SOLN
40.0000 mg | Freq: Two times a day (BID) | INTRAMUSCULAR | Status: DC
  Filled 2015-03-20: qty 4

## 2015-03-20 MED ORDER — FENTANYL BOLUS VIA INFUSION
25.0000 ug | INTRAVENOUS | Status: DC | PRN
  Administered 2015-03-21: 50 ug via INTRAVENOUS
  Filled 2015-03-20: qty 25

## 2015-03-20 MED ORDER — MIDAZOLAM HCL 2 MG/2ML IJ SOLN
INTRAMUSCULAR | Status: AC
  Filled 2015-03-20: qty 2

## 2015-03-20 MED ORDER — FENTANYL CITRATE (PF) 100 MCG/2ML IJ SOLN
50.0000 ug | Freq: Once | INTRAMUSCULAR | Status: AC
  Administered 2015-03-20: 50 ug via INTRAVENOUS

## 2015-03-20 MED ORDER — ANTISEPTIC ORAL RINSE SOLUTION (CORINZ)
7.0000 mL | Freq: Four times a day (QID) | OROMUCOSAL | Status: DC
  Administered 2015-03-21 – 2015-03-26 (×24): 7 mL via OROMUCOSAL

## 2015-03-20 MED ORDER — FENTANYL CITRATE (PF) 100 MCG/2ML IJ SOLN
INTRAMUSCULAR | Status: AC
  Filled 2015-03-20: qty 2

## 2015-03-20 MED ORDER — FENTANYL BOLUS VIA INFUSION
25.0000 ug | INTRAVENOUS | Status: DC | PRN
  Filled 2015-03-20: qty 25

## 2015-03-20 MED ORDER — CHLORHEXIDINE GLUCONATE 0.12% ORAL RINSE (MEDLINE KIT)
15.0000 mL | Freq: Two times a day (BID) | OROMUCOSAL | Status: DC
  Administered 2015-03-20 – 2015-03-26 (×12): 15 mL via OROMUCOSAL

## 2015-03-20 MED ORDER — ETOMIDATE 2 MG/ML IV SOLN
20.0000 mg | Freq: Once | INTRAVENOUS | Status: AC
  Administered 2015-03-20: 20 mg via INTRAVENOUS

## 2015-03-20 MED ORDER — ASPIRIN 81 MG PO CHEW
81.0000 mg | CHEWABLE_TABLET | Freq: Every day | ORAL | Status: DC
  Administered 2015-03-21 – 2015-04-04 (×13): 81 mg
  Filled 2015-03-20 (×13): qty 1

## 2015-03-20 MED ORDER — SODIUM CHLORIDE 0.9 % IV SOLN
25.0000 ug/h | INTRAVENOUS | Status: DC
  Administered 2015-03-20: 50 ug/h via INTRAVENOUS
  Filled 2015-03-20: qty 50

## 2015-03-20 MED ORDER — SODIUM CHLORIDE 0.9 % IV SOLN
25.0000 ug/h | INTRAVENOUS | Status: DC
  Administered 2015-03-20: 35 ug/h via INTRAVENOUS
  Administered 2015-03-21: 150 ug/h via INTRAVENOUS
  Administered 2015-03-22: 100 ug/h via INTRAVENOUS
  Administered 2015-03-23: 50 ug/h via INTRAVENOUS
  Filled 2015-03-20 (×3): qty 50

## 2015-03-20 MED ORDER — LABETALOL HCL 5 MG/ML IV SOLN
10.0000 mg | INTRAVENOUS | Status: DC | PRN
  Administered 2015-03-25 – 2015-03-27 (×2): 10 mg via INTRAVENOUS
  Filled 2015-03-20 (×2): qty 4

## 2015-03-20 MED ORDER — FENTANYL CITRATE (PF) 100 MCG/2ML IJ SOLN
200.0000 ug | Freq: Once | INTRAMUSCULAR | Status: AC
  Administered 2015-03-20: 200 ug via INTRAVENOUS

## 2015-03-20 MED ORDER — FUROSEMIDE 10 MG/ML IJ SOLN
40.0000 mg | Freq: Two times a day (BID) | INTRAMUSCULAR | Status: DC
  Administered 2015-03-20 (×2): 40 mg via INTRAVENOUS
  Filled 2015-03-20: qty 4

## 2015-03-20 MED ORDER — MIDAZOLAM HCL 2 MG/2ML IJ SOLN
4.0000 mg | Freq: Once | INTRAMUSCULAR | Status: AC
  Administered 2015-03-20: 4 mg via INTRAVENOUS

## 2015-03-20 MED ORDER — PANTOPRAZOLE SODIUM 40 MG IV SOLR
40.0000 mg | Freq: Every day | INTRAVENOUS | Status: DC
  Administered 2015-03-21 – 2015-03-23 (×3): 40 mg via INTRAVENOUS
  Filled 2015-03-20 (×3): qty 40

## 2015-03-20 MED ORDER — PROPOFOL 1000 MG/100ML IV EMUL
INTRAVENOUS | Status: AC
Start: 2015-03-20 — End: 2015-03-20
  Filled 2015-03-20: qty 100

## 2015-03-20 MED ORDER — FENTANYL CITRATE (PF) 100 MCG/2ML IJ SOLN: 50.0000 ug | Freq: Once | INTRAMUSCULAR | Status: DC

## 2015-03-20 MED ORDER — ROCURONIUM BROMIDE 50 MG/5ML IV SOLN
50.0000 mg | Freq: Once | INTRAVENOUS | Status: AC
  Administered 2015-03-20: 50 mg via INTRAVENOUS

## 2015-03-20 NOTE — Procedures (Signed)
NGT Placement  Placed under direct laryngoscopic visualization and confirmed with auscultation.  Alyson Reedy, M.D. [facility] Pulmonary/Critical Care Medicine. Pager: [phone]. After hours pager: [phone].

## 2015-03-20 NOTE — Progress Notes (Addendum)
PATIENT DETAILS Name: Rachel Baker Age: 65 y.o. Sex: female Date of Birth: December 15, 1950 Admit Date: 03/15/2015 Admitting Physician Elease Etienne, MD UXL:[id number], MD  Brief narrative:  65 year old female with no significant past medical history presented with 2 week history of exertional dyspnea and generalized weakness. Approximately 2 weeks back, patient had a flulike illness. Further evaluation with a chest x-ray showed multifocal lung infiltrates highly suspicious for pneumonia. She was also found to have related troponin levels. She was subsequently admitted for further evaluation and treatment. See below for further details  Subjective: Continues to have shortness of breath on minimal exertion.  Assessment/Plan: Active Problems: B/L lung infiltrates-CAP (community acquired pneumonia) vs acute diastolic CHF: Essentially unchanged-only minimal improvement if at best-completed a course of empiric Levaquin for 5 days. Blood cultures/respiratory virus panel/influenza panel negative. Unfortunately, in spite of being in a negative balance of -6.4 liters continues to be short of breath, chest x-ray is essentially unchanged. Given echo findings of aortic stenosis and elevated LV filling pressures- possible HOCM-this is likely cardiogenic edema-however we should have seen some improvement with diuresis at this point. Spoke with cardiology-recommendations are to increase Lasix to twice a day and-check a TEE and RHC when able to lie flat. We will check a HRCT at some point, if shortness of breath continues-especially given acrocyanosis of the distal phalanx of the right middle finger-interstitial lung disease/autoimmune causes need to be considered if no improvement in spite of aggressive diuresis-however autoimmune workup negative so far with the exception of mildly elevated rheumatoid factor.  Acute Hypoxic Resp Failure:secondary to above.Continue O2  supplementation  Elevated troponin level: Trend is flat and not consistent with ACS. Echocardiogram showed a vigorous systolic function with no wall motion abnormalities.Will likely need nuclear stress test at some point in the future. Cardiology following.  Systolic murmur:  likely secondary to moderate aortic stenosis. Cardiology contemplating TEE at some point-when able to lie flat.  Acrocyanosis of the distal phalanx of the right middle finger: tender to touch-radial pulses are strong-doubt Raynaud's phenomenon-suspect this could be either small vessel vasculitis or a embolic event. No obvious embolic source on echocardiogram, blood cultures continue to be negative. ANA/dsDNA/Scl 70/Anca serology negative. TSH/HBsAg/anti-HCV/HIV are negative as well. Cryoglobulin is pending. Rheumatoid factor slightly elevated, await anti-CCP. Right upper extremity duplex shows triphasic waveform. Cardiology contemplating TEE once respiratory status is more stable. On numerous over-the-counter herbal supplements-not sure of these could account for this as well.   Morbid obesity: Counseled regarding importance of weight loss  Disposition: Remain inpatient  Antimicrobial agents  See below  Anti-infectives    Start     Dose/Rate Route Frequency Ordered Stop   03/18/15 1400  levofloxacin (LEVAQUIN) tablet 750 mg     750 mg Oral Daily 03/18/15 1131 03/19/15 1500   03/16/15 1500  levofloxacin (LEVAQUIN) IVPB 750 mg  Status:  Discontinued     750 mg 100 mL/hr over 90 Minutes Intravenous Every 24 hours 03/15/15 1657 03/18/15 1131   03/15/15 1345  levofloxacin (LEVAQUIN) IVPB 750 mg     750 mg 100 mL/hr over 90 Minutes Intravenous  Once 03/15/15 1330 03/15/15 1527      DVT Prophylaxis: Prophylactic Lovenox   Code Status: Full code   Family Communication Spoke to patient's cousin-Dr.Kurien-at patient's request 2/13  Procedures: None  CONSULTS:  cardiology  Time spent 30 minutes-Greater than  50% of this time was spent in  counseling, explanation of diagnosis, planning of further management, and coordination of care.  MEDICATIONS: Scheduled Meds: . aspirin  81 mg Oral Daily  . enoxaparin (LOVENOX) injection  40 mg Subcutaneous Q24H  . furosemide  40 mg Intravenous BID  . loratadine  10 mg Oral Daily  . metoprolol tartrate  50 mg Oral BID  . potassium chloride  20 mEq Oral Daily  . vitamin C  2,000 mg Oral Daily   Continuous Infusions:  PRN Meds:.calcium carbonate, witch hazel-glycerin    PHYSICAL EXAM: Vital signs in last 24 hours: Filed Vitals:   03/19/15 1355 03/19/15 2015 03/19/15 2147 03/20/15 0315  BP: 97/38 101/43 122/55 116/50  Pulse: 76 81 80 78  Temp: 98.6 F (37 C) 100.2 F (37.9 C) 99.2 F (37.3 C) 99.5 F (37.5 C)  TempSrc: Oral Oral Oral Oral  Resp: 22 20 24 19   Height:      Weight:    106.323 kg (234 lb 6.4 oz)  SpO2: 93% 95%  95%    Weight change: 0.181 kg (6.4 oz) Filed Weights   03/18/15 0442 03/19/15 0500 03/20/15 0315  Weight: 106.3 kg (234 lb 5.6 oz) 106.142 kg (234 lb) 106.323 kg (234 lb 6.4 oz)   Body mass index is 41.53 kg/(m^2).   Gen Exam: Awake and alert with clear speech.  Neck: Supple, No JVD.   Chest: Moving air-continues to have bibasilar rales CVS: S1 S2 Regular, + systolic murmur.  Abdomen: soft, BS +, non tender, non distended.  Extremities: no edema, lower extremities warm to touch. Neurologic: Non Focal.   Skin: No Rash.   Wounds: N/A.     Intake/Output from previous day:  Intake/Output Summary (Last 24 hours) at 03/20/15 1047 Last data filed at 03/20/15 0900  Gross per 24 hour  Intake   1160 ml  Output   1450 ml  Net   -290 ml     LAB RESULTS: CBC  Recent Labs Lab 03/15/15 1219 03/16/15 1158 03/17/15 0309  WBC 11.7* 9.7 10.2  HGB 14.1 12.9 13.4  HCT 43.1 39.8 40.8  PLT 478* 427* 466*  MCV 91.1 90.7 90.3  MCH 29.8 29.4 29.6  MCHC 32.7 32.4 32.8  RDW 13.0 13.2 13.2  LYMPHSABS 1.1  --   --    MONOABS 1.0  --   --   EOSABS 0.1  --   --   BASOSABS 0.0  --   --     Chemistries   Recent Labs Lab 03/16/15 1158 03/17/15 0309 03/18/15 0440 03/19/15 0804 03/20/15 0440  NA 135 137 135 135 134*  K 3.9 4.1 3.9 4.5 4.2  CL 103 103 103 99* 98*  CO2 21* 22 21* 20* 25  GLUCOSE 116* 116* 107* 112* 110*  BUN 11 12 13 16 16   CREATININE 0.73 0.78 0.71 0.71 0.90  CALCIUM 8.6* 8.6* 8.6* 9.1 9.2    CBG: No results for input(s): GLUCAP in the last 168 hours.  GFR Estimated Creatinine Clearance: 72.8 mL/min (by C-G formula based on Cr of 0.9).  Coagulation profile No results for input(s): INR, PROTIME in the last 168 hours.  Cardiac Enzymes  Recent Labs Lab 03/15/15 2029 03/16/15 0232 03/16/15 1158  TROPONINI 0.97* 0.91* 0.61*    Invalid input(s): POCBNP No results for input(s): DDIMER in the last 72 hours. No results for input(s): HGBA1C in the last 72 hours. No results for input(s): CHOL, HDL, LDLCALC, TRIG, CHOLHDL, LDLDIRECT in the last 72 hours. No results for  input(s): TSH, T4TOTAL, T3FREE, THYROIDAB in the last 72 hours.  Invalid input(s): FREET3 No results for input(s): VITAMINB12, FOLATE, FERRITIN, TIBC, IRON, RETICCTPCT in the last 72 hours. No results for input(s): LIPASE, AMYLASE in the last 72 hours.  Urine Studies No results for input(s): UHGB, CRYS in the last 72 hours.  Invalid input(s): UACOL, UAPR, USPG, UPH, UTP, UGL, UKET, UBIL, UNIT, UROB, ULEU, UEPI, UWBC, URBC, UBAC, CAST, UCOM, BILUA  MICROBIOLOGY: Recent Results (from the past 240 hour(s))  Culture, blood (routine x 2) Call MD if unable to obtain prior to antibiotics being given     Status: None (Preliminary result)   Collection Time: 03/15/15  8:29 PM  Result Value Ref Range Status   Specimen Description BLOOD LEFT ANTECUBITAL  Final   Special Requests IN PEDIATRIC BOTTLE 4CC  Final   Culture NO GROWTH 4 DAYS  Final   Report Status PENDING  Incomplete  Culture, blood (routine x 2)  Call MD if unable to obtain prior to antibiotics being given     Status: None (Preliminary result)   Collection Time: 03/15/15  8:34 PM  Result Value Ref Range Status   Specimen Description BLOOD RIGHT HAND  Final   Special Requests BOTTLES DRAWN AEROBIC ONLY 5CC  Final   Culture NO GROWTH 4 DAYS  Final   Report Status PENDING  Incomplete  Respiratory virus panel     Status: None   Collection Time: 03/16/15 12:30 PM  Result Value Ref Range Status   Respiratory Syncytial Virus A Negative Negative Final   Respiratory Syncytial Virus B Negative Negative Final   Influenza A Negative Negative Final   Influenza B Negative Negative Final   Parainfluenza 1 Negative Negative Final   Parainfluenza 2 Negative Negative Final   Parainfluenza 3 Negative Negative Final   Metapneumovirus Negative Negative Final   Rhinovirus Negative Negative Final   Adenovirus Negative Negative Final    Comment: (NOTE) Performed At: [facility] [address] [place], [place] [id number] Mila Homer MD MA:[id number]     RADIOLOGY STUDIES/RESULTS: Dg Chest 2 View  03/20/2015  CLINICAL DATA:  Shortness of breath with exertion. EXAM: CHEST  2 VIEW COMPARISON:  03/18/2015 and 03/15/2015 FINDINGS: Lungs are adequately inflated with multifocal airspace process over the mid to lower lungs bilaterally left worse than right. Findings demonstrate overall slight worsening. Possible small amount left pleural fluid. Findings likely due to multifocal infection. Borderline stable cardiomegaly. Remainder of the exam is unchanged. IMPRESSION: Multifocal airspace process over the mid to lower lungs left greater than right with slight interval worsening likely representing multifocal infection. Possible small amount left pleural fluid. Stable borderline cardiomegaly. Electronically Signed   By: Elberta Fortis M.D.   On: 03/20/2015 08:07   Dg Chest 2 View  03/18/2015  CLINICAL DATA:  Shortness of breath and cough since  Friday, former smoker EXAM: CHEST  2 VIEW COMPARISON:  03/15/2015 FINDINGS: Enlargement of cardiac silhouette. Stable mediastinal contours. Diffuse BILATERAL airspace infiltrates, increased at RIGHT lower lobe versus previous study. Remaining lungs grossly unchanged. No definite pleural effusion or pneumothorax. Degenerative disc disease changes thoracic spine. IMPRESSION: Diffuse BILATERAL airspace infiltrates question pneumonia, increased in RIGHT lower lobe since previous exam. Electronically Signed   By: Ulyses Southward M.D.   On: 03/18/2015 08:10   Dg Chest 2 View  03/15/2015  CLINICAL DATA:  Severe shortness of breath for 1 week EXAM: CHEST  2 VIEW COMPARISON:  None. FINDINGS: Mild cardiac enlargement. Vascular pattern is  normal. Multifocal airspace disease throughout the bilateral mid to lower lung zones, left worse than right. No pleural effusion. IMPRESSION: Moderately severe multifocal bilateral nonspecific airspace disease. One consideration would be pneumonia. Electronically Signed   By: Esperanza Heir M.D.   On: 03/15/2015 11:19    Jeoffrey Massed, MD  [facility] Pager:[phone]  If 7PM-7AM, please contact night-coverage [email or web address] Password [email or web address] 03/20/2015, 10:47 AM   LOS: 5 days

## 2015-03-20 NOTE — Progress Notes (Signed)
Patient Name: Rachel Baker Date of Encounter: 03/20/2015  Active Problems:   CAP (community acquired pneumonia)   Elevated troponin   Acute diastolic heart failure (HCC)   Aortic stenosis   Mitral stenosis   Lobar pneumonia (HCC)   Morbid obesity with BMI of 40.0-44.9, adult (HCC)   Length of Stay: 5  SUBJECTIVE  Good response to diuretics. Despite this, chest xray infiltrates are at least as widespread as on arrival. Dyspneic walking to bathroom or even with eating.    CURRENT MEDS . aspirin  81 mg Oral Daily  . enoxaparin (LOVENOX) injection  40 mg Subcutaneous Q24H  . furosemide  40 mg Intravenous BID  . loratadine  10 mg Oral Daily  . metoprolol tartrate  50 mg Oral BID  . potassium chloride  20 mEq Oral Daily  . vitamin C  2,000 mg Oral Daily    OBJECTIVE   Intake/Output Summary (Last 24 hours) at 03/20/15 1105 Last data filed at 03/20/15 0900  Gross per 24 hour  Intake   1160 ml  Output   1450 ml  Net   -290 ml   Filed Weights   03/18/15 0442 03/19/15 0500 03/20/15 0315  Weight: 234 lb 5.6 oz (106.3 kg) 234 lb (106.142 kg) 234 lb 6.4 oz (106.323 kg)    PHYSICAL EXAM Filed Vitals:   03/19/15 1355 03/19/15 2015 03/19/15 2147 03/20/15 0315  BP: 97/38 101/43 122/55 116/50  Pulse: 76 81 80 78  Temp: 98.6 F (37 C) 100.2 F (37.9 C) 99.2 F (37.3 C) 99.5 F (37.5 C)  TempSrc: Oral Oral Oral Oral  Resp: 22 20 24 19   Height:      Weight:    234 lb 6.4 oz (106.323 kg)  SpO2: 93% 95%  95%   General: Alert, oriented x3, no distress, obese Head: no evidence of trauma, PERRL, EOMI, no exophtalmos or lid lag, no myxedema, no xanthelasma; normal ears, nose and oropharynx Neck: normal jugular venous pulsations and no hepatojugular reflux; brisk carotid pulses without delay and no carotid bruits Chest: basilar Velcro crackles R>L, no signs of consolidation by percussion or palpation, normal fremitus, symmetrical and full respiratory  excursions Cardiovascular: normal position and quality of the apical impulse, regular rhythm, normal first and second heart sounds, no rubs or gallops, 2/6 systolic ejection murmur Abdomen: no tenderness or distention, no masses by palpation, no abnormal pulsatility or arterial bruits, normal bowel sounds, no hepatosplenomegaly Extremities: no clubbing or edema; 2+ radial, ulnar and brachial pulses bilaterally; 2+ right femoral, posterior tibial and dorsalis pedis pulses; 2+ left femoral, posterior tibial and dorsalis pedis pulses; no subclavian or femoral bruits Persistent cyanosis and mild tenderness of 3rd right finger distal phalanx, improved slightly Neurological: grossly nonfocal  LABS  CBC No results for input(s): WBC, NEUTROABS, HGB, HCT, MCV, PLT in the last 72 hours. Basic Metabolic Panel  Recent Labs  03/19/15 0804 03/20/15 0440  NA 135 134*  K 4.5 4.2  CL 99* 98*  CO2 20* 25  GLUCOSE 112* 110*  BUN 16 16  CREATININE 0.71 0.90  CALCIUM 9.1 9.2   Liver Function Tests No results for input(s): AST, ALT, ALKPHOS, BILITOT, PROT, ALBUMIN in the last 72 hours. No results for input(s): LIPASE, AMYLASE in the last 72 hours. Cardiac Enzymes No results for input(s): CKTOTAL, CKMB, CKMBINDEX, TROPONINI in the last 72 hours. Thyroid Function Tests No results for input(s): TSH, T4TOTAL, T3FREE, THYROIDAB in the last 72 hours.  Invalid input(s): FREET3  Radiology Studies Imaging results have been reviewed and Dg Chest 2 View  03/20/2015  CLINICAL DATA:  Shortness of breath with exertion. EXAM: CHEST  2 VIEW COMPARISON:  03/18/2015 and 03/15/2015 FINDINGS: Lungs are adequately inflated with multifocal airspace process over the mid to lower lungs bilaterally left worse than right. Findings demonstrate overall slight worsening. Possible small amount left pleural fluid. Findings likely due to multifocal infection. Borderline stable cardiomegaly. Remainder of the exam is unchanged.  IMPRESSION: Multifocal airspace process over the mid to lower lungs left greater than right with slight interval worsening likely representing multifocal infection. Possible small amount left pleural fluid. Stable borderline cardiomegaly. Electronically Signed   By: Elberta Fortis M.D.   On: 03/20/2015 08:07    TELE NSR  ASSESSMENT AND PLAN  1. Acute diastolic heart failure: Dr Royann Shivers has reviewed the echo images and there is clear evidence of elevated filling pressures (notwithstanding the relatively low BNP level - obesity-related?). Despite good diuresis, still has rales. Exam for volume status is impeded by obesity. Will continue diuretics.  2. AS - while there are clear degenerative changes, I believe there is a component of subvalvular obstruction. HOCM? Hard to discern relative contribution of valvular versus subvalvular outflow obstruction. A TEE might help discern the two components. Not ready for TEE until respiratory status improves. Increase beta blocker.  3. MS - mild, but also should have better hemodynamics with beta blocker  4. Superimposed pneumonia? She mentions concerns about exposure to cat feces/cat litter as  Possible problem. Not sure how that fits in. Hypersensitivity pneumonitis?  5. Cyanotic 3 rd right finger - embolic lesion? Would expect Raynaud's to be more dynamic and less tender. TEE would also be useful to exclude cardioembolic source. Suspicion for endocarditis is low in the absence of fever. Could have had paroxysmal atrial fibrillation (moderate left atrial dilatation)?  Plan: MD to see. Possible TEE when stable from pulmonary standpoint. The pt's level of DOE and her increasing anxiety despite 6.4 L diuresis are concerning. Lasix increased. Coreg changed to Lopressor.   Corine Shelter PA-C 03/20/2015 11:05 AM   I have seen and examined the patient along with Corine Shelter PA-C.  I have reviewed the chart, notes and new data.  I agree with PA's note.  Key new  complaints: dyspnea is actually worse, low grade fever Key examination changes: bilateral rales still present, murmur is still prominent, hard to judge effect of Valsalva - she has a hard time holding her breath, cyanosis of finger appears better Key new findings / data: creatinine still normal  PLAN: TEE with anesthesia support tomorrow. Continue diuresis. Right heart catheterization to help distinguish cardiac versus pulmonary disease tomorrow or Friday.  Thurmon Fair, MD, [facility] [phone] 03/20/2015, 12:44 PM

## 2015-03-20 NOTE — Procedures (Signed)
Bedside Bronchoscopy Procedure Note [name] [id number] 05/13/1950  Procedure: Bronchoscopy Indications: Diagnostic evaluation of the airways and Remove secretions  Procedure Details: ET Tube Size: ET Tube secured at lip (cm): Bite block in place: Yes In preparation for procedure, Patient hyper-oxygenated with 100 % FiO2 and Saline given via ETT (30 ml) Airway entered and the following bronchi were examined: RUL, RML, RLL, LUL, LLL and Bronchi.   Bronchoscope removed.  , Patient placed back on 100% FiO2 at conclusion of procedure.    Evaluation BP 96/54 mmHg  Pulse 86  Temp(Src) 98.5 F (36.9 C) (Oral)  Resp 16  Ht 5\' 2"  (1.575 m)  Wt 231 lb 0.7 oz (104.8 kg)  BMI 42.25 kg/m2  SpO2 100% Breath Sounds:Clear and Diminished O2 sats: stable throughout Patient's Current Condition: stable Specimens:  Sent serosanguinous fluid Complications: No apparent complications Patient did tolerate procedure well.   03/20/2015, 6:35 PM

## 2015-03-20 NOTE — Progress Notes (Addendum)
Continues to get more hypoxic rapdily throughout the day, inspite of aggressive diuresis. Now upto 4 L of O2 via [place]. I am now more concerned that she probably has non cardiogenic pul edema (see note from today). She appears more dyspneic that yesterday and now is on 4L of O2 at rest, and requires more for ambulation/to get to bedside commode. I have consulted PCCM for assistance.

## 2015-03-20 NOTE — Procedures (Signed)
Central Venous Catheter Insertion Procedure Note Rachel Baker [id number] 1950/12/04  Procedure: Insertion of Central Venous Catheter Indications: Assessment of intravascular volume, Drug and/or fluid administration and Frequent blood sampling  Procedure Details Consent: Risks of procedure as well as the alternatives and risks of each were explained to the (patient/caregiver).  Consent for procedure obtained. Time Out: Verified patient identification, verified procedure, site/side was marked, verified correct patient position, special equipment/implants available, medications/allergies/relevent history reviewed, required imaging and test results available.  Performed  Maximum sterile technique was used including antiseptics, cap, gloves, gown, hand hygiene, mask and sheet. Skin prep: Chlorhexidine; local anesthetic administered A antimicrobial bonded/coated triple lumen catheter was placed in the left internal jugular vein using the Seldinger technique.  Evaluation Blood flow good Complications: No apparent complications Patient did tolerate procedure well. Chest X-ray ordered to verify placement.  CXR: pending.  Procedure performed under direct ultrasound guidance for real time vessel cannulation.      Rutherford Guys, [place] [facility] Pager: [phone]  or [phone] 03/20/2015, 7:12 PM  Alyson Reedy, M.D. [facility] Pulmonary/Critical Care Medicine. Pager: [phone]. After hours pager: [phone].

## 2015-03-20 NOTE — H&P (Signed)
PULMONARY / CRITICAL CARE MEDICINE   Name: Rachel Baker MRN: [id number] DOB: 1950-10-01    ADMISSION DATE:  03/15/2015 CONSULTATION DATE:  03/20/2015  REFERRING MD:  Jonetta Osgood, MD  CHIEF COMPLAINT:  Shortness of breath.   HISTORY OF PRESENT ILLNESS:  Pt is encephelopathic; therefore, this HPI is obtained from chart review. Rachel Baker is a 65 y.o. female with PMH as outlined below. She initially presented with 1-2 days of a flu-like illness with progressive shortness of breath, generalized weakness, fatigue, associated with diarrhea. No fever, chills, LE edema, chest pain, nausea or vomiting. She had progressive SOB, was evaluated in the ED with CXR concerning for multifocal pneumonia, and troponin elevation. She was admitted to the hospital on 03/14/2014 and further workup was concerning for heart failure. She was found to have Mitral stenosis and Aortic stenosis. BNP was very low. She underwent diuresis with little improvement in her symptoms, and her respiratory status actually has become worse over time. Was also treated for pneumonia, but CXR continued to demonstrate no resolution. She developed a purple lesion on the tip of her right long finger with concern for embolic source. Additionally, with worsening CXR and respiratory status in spite of treatment, autoimmune workup was also performed which has been negative except for mildly elevated RF.   On 2/15 - she began to develop worsening respiratory status with increasing O2 requirement. She required transfer to the ICU, and intubation. Bronchoscopy performed as well.   PAST MEDICAL HISTORY :  She  has a past medical history of CAP (community acquired pneumonia) (03/15/2015); Family history of adverse reaction to anesthesia; Complication of anesthesia; Sleep apnea; History of blood transfusion; GERD (gastroesophageal reflux disease); Ocular migraine; and Arthritis.  PAST SURGICAL HISTORY: She  has past surgical history  that includes Carpal tunnel release (Bilateral); Cataract extraction w/ intraocular lens  implant, bilateral (Bilateral, 2000s); and Mouth surgery.  Allergies  Allergen Reactions  . Penicillins Hives and Itching    Has patient had a PCN reaction causing immediate rash, facial/tongue/throat swelling, SOB or lightheadedness with hypotension: Yes Has patient had a PCN reaction causing severe rash involving mucus membranes or skin necrosis: No Has patient had a PCN reaction that required hospitalization No Has patient had a PCN reaction occurring within the last 10 years: No If all of the above answers are "NO", then may proceed with Cephalosporin use.   . Sulfa Antibiotics Hives and Itching  . Ceclor [Cefaclor] Itching and Rash    No current facility-administered medications on file prior to encounter.   No current outpatient prescriptions on file prior to encounter.    FAMILY HISTORY:  Her has no family status information on file.   SOCIAL HISTORY: She  reports that she has quit smoking. Her smoking use included Cigarettes. She has a 9.57 pack-year smoking history. She has never used smokeless tobacco. She reports that she drinks alcohol. She reports that she does not use illicit drugs.  REVIEW OF SYSTEMS:   Unable to obtain due to sedation with ventilation. Per chart review above.   SUBJECTIVE:  Intubated,  Hypertensive after line placement  VITAL SIGNS: BP 179/72 mmHg  Pulse 106  Temp(Src) 98.5 F (36.9 C) (Oral)  Resp 18  Ht _0  (1.575 m)  Wt 231 lb 0.7 oz (104.8 kg)  BMI 42.25 kg/m2  SpO2 100%  HEMODYNAMICS:    VENTILATOR SETTINGS: Vent Mode:  [-] PRVC FiO2 (%):  [100 %] 100 % Set Rate:  [16 bmp]  16 bmp Vt Set:  [400 mL] 400 mL PEEP:  [10 cmH20] 10 cmH20 Plateau Pressure:  [26 cmH20] 26 cmH20  INTAKE / OUTPUT: I/O last 3 completed shifts: In: 1760 [P.O.:1760] Out: 0712 [Urine:3475]   PHYSICAL EXAMINATION: General: respiratory distress >  intubated Neuro: A&O x 3, non-focal prior to intubation.  HEENT: [place]/AT. PERRL, sclerae anicteric. Cardiovascular: RRR, systolic murmur, No gallops or rubs, S1/S2.  Lungs: Respirations labored, even, Coarse sounds bilaterally, rales.  Abdomen: BS x 4, soft, NT/ND.  Musculoskeletal: No gross deformities, no edema.  Skin: Intact, warm, no rashes. Noted Blue discoloration of right long finger. ? Splinter hemorrhages.   LABS:  BMET  Recent Labs Lab 03/18/15 0440 03/19/15 0804 03/20/15 0440  NA 135 135 134*  K 3.9 4.5 4.2  CL 103 99* 98*  CO2 21* 20* 25  BUN _0 CREATININE 0.71 0.71 0.90  GLUCOSE 107* 112* 110*    Electrolytes  Recent Labs Lab 03/18/15 0440 03/19/15 0804 03/20/15 0440  CALCIUM 8.6* 9.1 9.2    CBC  Recent Labs Lab 03/15/15 1219 03/16/15 1158 03/17/15 0309  WBC 11.7* 9.7 10.2  HGB 14.1 12.9 13.4  HCT 43.1 39.8 40.8  PLT 478* 427* 466*    Coag's No results for input(s): APTT, INR in the last 168 hours.  Sepsis Markers  Recent Labs Lab 03/15/15 1227 03/15/15 1539 03/16/15 1408 03/18/15 0440 03/20/15 0440  LATICACIDVEN 1.65 0.79  --   --   --   PROCALCITON  --   --  <0.10 <0.10 0.11    ABG No results for input(s): PHART, PCO2ART, PO2ART in the last 168 hours.  Liver Enzymes  Recent Labs Lab 03/16/15 1158 03/17/15 0309  AST 27 24  ALT 21 22  ALKPHOS 59 59  BILITOT 0.2* 0.4  ALBUMIN 2.3* 2.3*    Cardiac Enzymes  Recent Labs Lab 03/15/15 2029 03/16/15 0232 03/16/15 1158  TROPONINI 0.97* 0.91* 0.61*    Glucose No results for input(s): GLUCAP in the last 168 hours.  Imaging Dg Chest 2 View  03/20/2015  CLINICAL DATA:  Shortness of breath with exertion. EXAM: CHEST  2 VIEW COMPARISON:  03/18/2015 and 03/15/2015 FINDINGS: Lungs are adequately inflated with multifocal airspace process over the mid to lower lungs bilaterally left worse than right. Findings demonstrate overall slight worsening. Possible small amount  left pleural fluid. Findings likely due to multifocal infection. Borderline stable cardiomegaly. Remainder of the exam is unchanged. IMPRESSION: Multifocal airspace process over the mid to lower lungs left greater than right with slight interval worsening likely representing multifocal infection. Possible small amount left pleural fluid. Stable borderline cardiomegaly. Electronically Signed   By: Marin Olp M.D.   On: 03/20/2015 08:07     STUDIES:  CXR 2/15 > ?Multifocal pneumonia worsening  Echo > LV EF 65-70%, G1DD, Moderate AS, Calcified Mitral Annulus mild stenosis / mild regurg, LA mod dialation, trivial pericardial effusion.  RUE U/S > Negative  CULTURES: BAL Culture 2/15 > Pneumocystis Smear DFA 2/15 > Legionella Culture 2/15 >  Fungus Culture with Smear 2/15 > Blood Culture 2/10 > NG x 5 days RVP 2/11 > Negative  ANTIBIOTICS: Levaquin 2/10 - 2/14  SIGNIFICANT EVENTS: 2/10 > Admitted concern multifocal pneumonia vs. CHF exacerbation 2/13- 14 > little response to diuresis 2/15 > Progressive respiratory failure requiring intubation, bronchoscopy performed  LINES/TUBES: CVL 2/15 > Foley 2/15 > ETT 2/15 >   DISCUSSION: 65 y/o F with PMH above, initially admitted with SOB  thought to be due to multifocal pneumonia vs. CHF, but no response to initial treatment and actually worsening respiratory status now requiring intubation.   ASSESSMENT / PLAN:  PULMONARY A: Acute Hypoxic Respiratory Failure  Intubated Multifocal Pneumonia vs. Pulmonary Edema vs. Autoimmune Process.  Atypical Pneumonia - ? Mycobacterium, ? Fungal ? PE - at risk  P:   Full vent support. Wean as able. VAP prevention measures. SBT in AM if able. Bronchoscopy performed - awaiting cultures, and smear High res CT chest without contrast IgE, CRP, ESR CXR in AM.  CARDIOVASCULAR A:  Hypertension - severe range pressures post - CVL Mitral Stenosis Acute diastolic HF - not responding to diuresis.   Aortic Stenosis ? HOCM  P:  Lopressor Lasix TEE per cardiology hopefully tomorrow or Friday Cards following [facility] to further assess.   RENAL A:   Diuresis for HF as above Otherwise stable P:   Trending fluid balance, goal is negative.  BMP in AM.  GASTROINTESTINAL A:   Stable no issues.  GI prophylaxis. Nutrition.  P:   SUP: Pantoprazole. NPO > tube feeds when able.   HEMATOLOGIC A:   Stable no issues VTE Prophylaxis. P:  SCD's / lovenox CBC now and in AM.  INFECTIOUS A:   Multifocal pneumonia Low Grade Temps - on ASA P:   Abx per ID ID on board Follow cultures as above. Follow Micro Pending BAL cultures.  PCT negative so far. No leukocytosis.   ENDOCRINE A:   Stable, No issues P:   SSI as needed Check TSH  NEUROLOGIC A:   Stable, no issues Sedated on Vent P:   Sedation:  Propofol, Versed RASS goal: 0 to -1. Daily WUA.   Family updated: By Dr. Nelda Marseille.   Interdisciplinary Family Meeting v Palliative Care Meeting:  Due by: 03/27/2015  Paula Compton, MD Family Medicine - PGY 2  Discussed with Dr. Nelda Marseille.   Attending Note:  65 year old female with PMH of HTN who presents to the hospital with SOB. Found to have pulmonary infiltrate. Patient was diuresed 6.5 liter and is actually worsening since she has been in the hospital. Patient's saturation deteriorated to the point where she is saturating 75% on 100% NRB and PCCM was called on consultation. Patient moved here from [place] 2.5 months ago. She has multiple cats. Her occupational history is uneventful. Only smoked for 7 years in her 41's. Does take multiple supplement and herbal medications. On exam, diffuse crackles in all lung fields. I reviewed CXR myself, bilateral airspace disease noted. DDx is broad at this point, procalcitonin is negative, BNP is negative, auto-immune work up is negative other than mildly positive rheumatoid. Infectious etiologies are certainly non-bacterial given PCT is  negative, concern for coccidio and histo. Will intubate, bronch patient and send BAL for bacterial, fungal and AFB stain and cultures. Will also send fungal antibody panel and histo urine antigen. Place on high PEEP and get a CT with contrast to r/o PE. Stop diureses given contrast. Plan TEE and right heart cath.   The patient is critically ill with multiple organ systems failure and requires high complexity decision making for assessment and support, frequent evaluation and titration of therapies, application of advanced monitoring technologies and extensive interpretation of multiple databases.   Critical Care Time devoted to patient care services described in this note is 60 Minutes. This time reflects time of care of this signee Dr Jennet Maduro. This critical care time does not reflect procedure time, or teaching time  or supervisory time of PA/NP/Med student/Med Resident etc but could involve care discussion time.   Rush Farmer, M.D.  [facility] Pulmonary/Critical Care Medicine.  Pager: [phone].  After hours pager: [phone].

## 2015-03-20 NOTE — Procedures (Signed)
Intubation Procedure Note Randall Rampersad [id number] [birth year]  Procedure: Intubation Indications: Respiratory insufficiency  Procedure Details Consent: Risks of procedure as well as the alternatives and risks of each were explained to the (patient/caregiver).  Consent for procedure obtained. Time Out: Verified patient identification, verified procedure, site/side was marked, verified correct patient position, special equipment/implants available, medications/allergies/relevent history reviewed, required imaging and test results available.  Performed  Maximum sterile technique was used including gloves, hand hygiene and mask.  MAC    Evaluation Hemodynamic Status: BP stable throughout; O2 sats: stable throughout Patient's Current Condition: stable Complications: No apparent complications Patient did tolerate procedure well. Chest X-ray ordered to verify placement.  CXR: pending.   Koren Bound 03/20/2015

## 2015-03-20 NOTE — Procedures (Signed)
Bronchoscopy Procedure Note Rachel Baker [id number] 17-Feb-1950  Procedure: Bronchoscopy Indications: Obtain specimens for culture and/or other diagnostic studies  Procedure Details Consent: Risks of procedure as well as the alternatives and risks of each were explained to the (patient/caregiver).  Consent for procedure obtained. Time Out: Verified patient identification, verified procedure, site/side was marked, verified correct patient position, special equipment/implants available, medications/allergies/relevent history reviewed, required imaging and test results available.  Performed  In preparation for procedure, patient was given 100% FiO2 and bronchoscope lubricated. Sedation: Benzodiazepines, Muscle relaxants, Etomidate and Fentanyl  Airway entered and the following bronchi were examined: RUL, RML, RLL, LUL, LLL and Bronchi.   Airway was inflamed but no endobronchial lesions noted. BAL from LLL. Bronchoscope removed.    Evaluation Hemodynamic Status: BP stable throughout; O2 sats: stable throughout Patient's Current Condition: stable Specimens:  Sent serosanguinous fluid Complications: No apparent complications Patient did tolerate procedure well.   Rachel Baker 03/20/2015

## 2015-03-20 NOTE — Progress Notes (Signed)
PT Cancellation Note  Patient Details Name: Rachel Baker MRN: [id number] DOB: 01-01-1951   Cancelled Treatment:    Reason Eval/Treat Not Completed: Medical issues which prohibited therapy.  Pt indicates increased SOB today even while just sitting in bed.  Declined any attempts at mobility at this time.  Will f/u another time.     Sunny Schlein, [place] [phone] 03/20/2015, 9:59 AM

## 2015-03-21 ENCOUNTER — Inpatient Hospital Stay (HOSPITAL_COMMUNITY): Payer: Medicare HMO

## 2015-03-21 ENCOUNTER — Encounter (HOSPITAL_COMMUNITY): Payer: Self-pay | Admitting: Anesthesiology

## 2015-03-21 ENCOUNTER — Encounter (HOSPITAL_COMMUNITY)
Admission: EM | Disposition: A | Discharge status: Skilled Nursing Facility | Payer: Self-pay | Source: Home / Self Care | Attending: Internal Medicine

## 2015-03-21 DIAGNOSIS — J9601 Acute respiratory failure with hypoxia: Secondary | ICD-10-CM

## 2015-03-21 DIAGNOSIS — I421 Obstructive hypertrophic cardiomyopathy: Secondary | ICD-10-CM | POA: Diagnosis present

## 2015-03-21 DIAGNOSIS — J189 Pneumonia, unspecified organism: Secondary | ICD-10-CM

## 2015-03-21 LAB — BASIC METABOLIC PANEL
Anion gap: 14 (ref 5–15)
BUN: 27 mg/dL — AB (ref 6–20)
CHLORIDE: 98 mmol/L — AB (ref 101–111)
CO2: 24 mmol/L (ref 22–32)
CREATININE: 1.22 mg/dL — AB (ref 0.44–1.00)
Calcium: 8.9 mg/dL (ref 8.9–10.3)
GFR calc Af Amer: 53 mL/min — ABNORMAL LOW (ref >60)
GFR calc non Af Amer: 45 mL/min — ABNORMAL LOW (ref >60)
Glucose, Bld: 110 mg/dL — ABNORMAL HIGH (ref 65–99)
Potassium: 4 mmol/L (ref 3.5–5.1)
Sodium: 136 mmol/L (ref 135–145)

## 2015-03-21 LAB — CBC
HCT: 38.3 % (ref 36.0–46.0)
Hemoglobin: 12.2 g/dL (ref 12.0–15.0)
MCH: 28.8 pg (ref 26.0–34.0)
MCHC: 31.9 g/dL (ref 30.0–36.0)
MCV: 90.5 fL (ref 78.0–100.0)
PLATELETS: 515 10*3/uL — AB (ref 150–400)
RBC: 4.23 MIL/uL (ref 3.87–5.11)
RDW: 13.5 % (ref 11.5–15.5)
WBC: 15.8 10*3/uL — ABNORMAL HIGH (ref 4.0–10.5)

## 2015-03-21 LAB — HISTOPLASMA ANTIGEN, URINE: Histoplasma Antigen, urine: 0 ng/mL (ref 0.00–0.49)

## 2015-03-21 LAB — POCT I-STAT 3, ART BLOOD GAS (G3+)
Bicarbonate: 26.9 mEq/L — ABNORMAL HIGH (ref 20.0–24.0)
O2 SAT: 99 %
Patient temperature: 97.6
TCO2: 28 mmol/L (ref 0–100)
pCO2 arterial: 49.4 mmHg — ABNORMAL HIGH (ref 35.0–45.0)
pH, Arterial: 7.341 — ABNORMAL LOW (ref 7.350–7.450)
pO2, Arterial: 159 mmHg — ABNORMAL HIGH (ref 80.0–100.0)

## 2015-03-21 LAB — CRYPTOCOCCAL ANTIGEN: Crypto Ag: NEGATIVE

## 2015-03-21 LAB — GLUCOSE, CAPILLARY: GLUCOSE-CAPILLARY: 89 mg/dL (ref 65–99)

## 2015-03-21 LAB — MAGNESIUM: MAGNESIUM: 2 mg/dL (ref 1.7–2.4)

## 2015-03-21 LAB — PHOSPHORUS: PHOSPHORUS: 7.3 mg/dL — AB (ref 2.5–4.6)

## 2015-03-21 LAB — CYCLIC CITRUL PEPTIDE ANTIBODY, IGG/IGA: CCP Antibodies IgG/IgA: 20 units — ABNORMAL HIGH (ref 0–19)

## 2015-03-21 SURGERY — ECHOCARDIOGRAM, TRANSESOPHAGEAL
Anesthesia: Monitor Anesthesia Care

## 2015-03-21 MED ORDER — SODIUM CHLORIDE 0.9 % IV SOLN: INTRAVENOUS | Status: DC

## 2015-03-21 MED ORDER — VITAL HIGH PROTEIN PO LIQD
1000.0000 mL | ORAL | Status: DC
  Administered 2015-03-21 – 2015-03-24 (×3): 1000 mL

## 2015-03-21 MED ORDER — SODIUM CHLORIDE 0.9 % IV SOLN
INTRAVENOUS | Status: DC
  Administered 2015-03-21: 12:00:00 via INTRAVENOUS
  Administered 2015-03-26: 10 mL/h via INTRAVENOUS

## 2015-03-21 MED ORDER — PRO-STAT SUGAR FREE PO LIQD
30.0000 mL | Freq: Two times a day (BID) | ORAL | Status: DC
  Administered 2015-03-21 – 2015-03-23 (×6): 30 mL
  Filled 2015-03-21 (×10): qty 30

## 2015-03-21 NOTE — Progress Notes (Signed)
UR Completed. Takeshi Teasdale, RN, BSN.  [phone] 

## 2015-03-21 NOTE — CV Procedure (Signed)
TRANSESOPHAGEAL ECHOCARDIOGRAM (TEE) NOTE  INDICATIONS: infective endocarditis, possible HOCM or aortic stenosis  PROCEDURE:   Informed consent was obtained prior to the procedure. The risks, benefits and alternatives for the procedure were discussed and the patient comprehended these risks.  Risks include, but are not limited to, cough, sore throat, vomiting, nausea, somnolence, esophageal and stomach trauma or perforation, bleeding, low blood pressure, aspiration, pneumonia, infection, trauma to the teeth and death.    After a procedural time-out, the patient (who was intubated and on a propofol drip) was given an IV propofol bolus by Dr. Molli Knock (PCCM) for sedation.  The oropharynx was anesthetized with 2 sprays of topical cetacaine.  The transesophageal probe was inserted in the esophagus and stomach without difficulty and multiple views were obtained.  The patient was kept under observation until the patient left the procedure room.  The patient left the procedure room in stable condition.   Agitated microbubble saline contrast was administered.  COMPLICATIONS:    There were no immediate complications.  Findings:  1. LEFT VENTRICLE: The left ventricular wall thickness is severely increased, particular the basal septum. There is increased LVOT velocity in the subvalvular area with SAM.  The left ventricular cavity is small in size. Wall motion is hyperdynamic.  LVEF is 65-70%.  2. RIGHT VENTRICLE:  The right ventricle is normal in structure and function without any thrombus or masses.    3. LEFT ATRIUM:  The left atrium is dilated in size without any thrombus or masses.  There is not spontaneous echo contrast ("smoke") in the left atrium consistent with a low flow state.  4. LEFT ATRIAL APPENDAGE:  The left atrial appendage is free of any thrombus or masses. The appendage has single lobes. Pulse doppler indicates moderate flow in the appendage.  5. ATRIAL SEPTUM:  The atrial  septum appears intact and is free of thrombus and/or masses.  There is no evidence for interatrial shunting by color doppler and saline microbubble.  6. RIGHT ATRIUM:  The right atrium is normal in size and function without any thrombus or masses.  7. MITRAL VALVE:  The mitral valve annulus is calcified. The posterior subvalvular cord has a focus of calcification. There is not any clear, mobile vegetation of the valve leaflets. There is  Mild posteriorly directed regurgitation. SAM of the anterior leaflet was noted secondary to LVOT acceleration.  8. AORTIC VALVE:  The aortic valve is trileaflet, normal in structure and function with trivial regurgitation. There is a small focus of calcification on the border of the non-coronary cusp.  There were no vegetations and no stenosis.  9. TRICUSPID VALVE:  The tricuspid valve is normal in structure and function with Mild regurgitation.  There were no vegetations or stenosis  10.  PULMONIC VALVE:  The pulmonic valve is normal in structure and function with Mild regurgitation.  There were no vegetations or stenosis.   11. AORTIC ARCH, ASCENDING AND DESCENDING AORTA:  There was grade 1 Myrtis Ser et. Al, 1992) atherosclerosis of the ascending aorta, aortic arch, or proximal descending aorta.  12. PULMONARY VEINS: Anomalous pulmonary venous return was not noted.  13. PERICARDIUM: The pericardium appeared normal and non-thickened.  There is no pericardial effusion.  IMPRESSION:   1. No clear infective endocarditis. 2. Mitral annular and subvalvular calcification and nodules, which could have the potential for embolism. 3. Trileaflet aortic valve with a calcified nodule on the NCC, but no aortic stenosis. 4. SAM of the anterior mitral leaflet with mild,  posteriorly directed mitral regurgitation. 5. Severe concentric LVH with more significant proximal septal thickening and obstruction - consistent with HOCM. 6. LVEF 65-70% with hyperdynamic  function  RECOMMENDATIONS:    1. No clear source of endocarditis. Mitral valve calcification with subvalvular calcified nodules. HOCM with SAM. LVOT gradient was better quantified on the 2D echo (see those findings).   Time Spent Directly with the Patient:  45 minutes   Chrystie Nose, MD, [facility] Attending Cardiologist [facility]  03/21/2015, 2:02 PM

## 2015-03-21 NOTE — Progress Notes (Signed)
Patient Name: Rachel Baker Date of Encounter: 03/21/2015  Active Problems:   CAP (community acquired pneumonia)   Elevated troponin   Acute diastolic heart failure (HCC)   Aortic stenosis   Mitral stenosis   Lobar pneumonia (HCC)   Morbid obesity with BMI of 40.0-44.9, adult (HCC)   Acute respiratory failure with hypoxia (HCC)   SOB (shortness of breath)   Length of Stay: 6  SUBJECTIVE  Alert, calm on vent.  CURRENT MEDS . antiseptic oral rinse  7 mL Mouth Rinse QID  . aspirin  81 mg Per Tube Daily  . chlorhexidine gluconate  15 mL Mouth Rinse BID  . enoxaparin (LOVENOX) injection  40 mg Subcutaneous Q24H  . fentaNYL (SUBLIMAZE) injection  50 mcg Intravenous Once  . pantoprazole (PROTONIX) IV  40 mg Intravenous Daily    OBJECTIVE   Intake/Output Summary (Last 24 hours) at 03/21/15 1006 Last data filed at 03/21/15 0900  Gross per 24 hour  Intake 696.27 ml  Output   2260 ml  Net -1563.73 ml   Filed Weights   03/20/15 0315 03/20/15 1822 03/21/15 0453  Weight: 106.323 kg (234 lb 6.4 oz) 104.8 kg (231 lb 0.7 oz) 106.3 kg (234 lb 5.6 oz)    PHYSICAL EXAM Filed Vitals:   03/21/15 0700 03/21/15 0800 03/21/15 0828 03/21/15 0900  BP: 100/48 95/54 95/54  120/60  Pulse: 80 76 84 82  Temp: 99.9 F (37.7 C) 99.9 F (37.7 C)  99.1 F (37.3 C)  TempSrc:      Resp: 21 20 38 27  Height:      Weight:      SpO2: 99% 98% 100% 94%   General: Alert, oriented x3, no distress despite ET and OG intubation Head: no evidence of trauma, PERRL, EOMI, no exophtalmos or lid lag, no myxedema, no xanthelasma; normal ears, nose and oropharynx Neck: normal jugular venous pulsations and no hepatojugular reflux; brisk carotid pulses without delay and no carotid bruits Chest: clear to auscultation, no signs of consolidation by percussion or palpation, normal fremitus, symmetrical and full respiratory excursions Cardiovascular: normal position and quality of the apical impulse, regular  rhythm, normal first and second heart sounds, no rubs or gallops, 3/6 aortic ejection murmur, mid peaking murmur Abdomen: no tenderness or distention, no masses by palpation, no abnormal pulsatility or arterial bruits, normal bowel sounds, no hepatosplenomegaly Extremities: no clubbing, cyanosis or edema; 2+ radial, ulnar and brachial pulses bilaterally; 2+ right femoral, posterior tibial and dorsalis pedis pulses; 2+ left femoral, posterior tibial and dorsalis pedis pulses; no subclavian or femoral bruits Neurological: grossly nonfocal  LABS  CBC  Recent Labs  03/20/15 2000 03/21/15 0400  WBC 20.1* 15.8*  NEUTROABS 17.5*  --   HGB 12.9 12.2  HCT 39.7 38.3  MCV 90.2 90.5  PLT 586* 515*   Basic Metabolic Panel  Recent Labs  03/20/15 0440 03/21/15 0400  NA 134* 136  K 4.2 4.0  CL 98* 98*  CO2 25 24  GLUCOSE 110* 110*  BUN 16 27*  CREATININE 0.90 1.22*  CALCIUM 9.2 8.9  MG  --  2.0  PHOS  --  7.3*   Recent Labs  03/20/15 2000  TRIG 148   Thyroid Function Tests  Recent Labs  03/20/15 2030  TSH 2.691    Radiology Studies Imaging results have been reviewed and Dg Chest 2 View  03/20/2015  CLINICAL DATA:  Shortness of breath with exertion. EXAM: CHEST  2 VIEW COMPARISON:  03/18/2015 and 03/15/2015  FINDINGS: Lungs are adequately inflated with multifocal airspace process over the mid to lower lungs bilaterally left worse than right. Findings demonstrate overall slight worsening. Possible small amount left pleural fluid. Findings likely due to multifocal infection. Borderline stable cardiomegaly. Remainder of the exam is unchanged. IMPRESSION: Multifocal airspace process over the mid to lower lungs left greater than right with slight interval worsening likely representing multifocal infection. Possible small amount left pleural fluid. Stable borderline cardiomegaly. Electronically Signed   By: Elberta Fortis M.D.   On: 03/20/2015 08:07   Portable Chest Xray  03/21/2015   CLINICAL DATA:  Acute on chronic respiratory failure, community-acquired pneumonia, acute CHF, known aortic and mitral stenosis EXAM: PORTABLE CHEST 1 VIEW COMPARISON:  Portable chest x-ray of March 20, 2015 FINDINGS: The lungs are well-expanded. The left hemidiaphragm is slightly better demonstrated today. Patchy confluent interstitial and alveolar opacities persist bilaterally. The cardiac silhouette is enlarged. The pulmonary vascularity is mildly prominent centrally but stable. The endotracheal tube tip lies 3 cm above the carina. The esophagogastric tube tip projects below the inferior margin of the image. The left internal jugular venous catheter tip projects over the midportion of the SVC. IMPRESSION: Persistent bilateral pneumonia. Superimposed low-grade CHF. Slight interval improvement since yesterday's study. The support tubes are in reasonable position. Electronically Signed   By: David  [place] M.D.   On: 03/21/2015 07:44   Dg Chest Port 1 View  03/20/2015  CLINICAL DATA:  Encounter for intubation, bronchoscopy and central line placement. EXAM: PORTABLE CHEST 1 VIEW COMPARISON:  03/20/2015 at 7:20 a.m. FINDINGS: Endotracheal tube tip projects 3.4 cm about the carina. Nasal/ orogastric tube passes below the diaphragm well into the stomach below the included field of view. Left internal jugular central venous line tip projects in the lower superior vena cava. No pneumothorax. Bilateral lung opacities are without significant change from the earlier study allowing differences in positioning and technique. No new lung abnormalities. IMPRESSION: 1. Endotracheal tube, nasal/orogastric tube and left internal jugular central venous line are well positioned. 2. No pneumothorax. Electronically Signed   By: Amie Portland M.D.   On: 03/20/2015 19:32    TELE NSR    ASSESSMENT AND PLAN  1. Acute diastolic heart failure: Despite good diuresis, CXR remains severely abnormal. Exam for volume status is  impeded by obesity.   2. AS - while there are clear degenerative changes, I believe there is a component of subvalvular obstruction. HOCM? Hard to discern relative contribution of valvular versus subvalvular outflow obstruction. A TEE might help discern the two components and is scheduled for today.  3. MS - mild, but also should have better hemodynamics with beta blocker  4. Multilobar pneumonia She mentions concerns about exposure to cat feces/cat litter and mold exposure aspossible problem. Not sure how that fits in. Hypersensitivity pneumonitis? Unusual pathogen such as fungal?  5. Cyanotic 3rd right finger - embolic lesion? Suspicion for endocarditis appeared low in the absence of fever and other constitutional symptoms, but now sicker and WBC up. Could have had paroxysmal atrial fibrillation (moderate left atrial dilatation)? TEE today to exclude cardioembolic source.   Thurmon Fair, MD, [facility] [phone] office [phone] pager 03/21/2015 10:06 AM

## 2015-03-21 NOTE — Progress Notes (Signed)
Initial Nutrition Assessment  DOCUMENTATION CODES:   Morbid obesity  INTERVENTION:    After procedures today, Initiate TF via OGT with Vital High Protein at 25 ml/h and Prostat 30 ml BID on day 1; on day 2, increase to goal rate of 45 ml/h (1080 ml per day) to provide 1280 kcals, 125 gm protein, 903 ml free water daily.  Total intake with propofol and TF will be 1446 kcals.  NUTRITION DIAGNOSIS:   Inadequate oral intake related to inability to eat as evidenced by NPO status.  GOAL:   Provide needs based on ASPEN/SCCM guidelines  MONITOR:   Vent status, Labs, Weight trends, TF tolerance, I & O's  REASON FOR ASSESSMENT:   Consult (Verbal Consult) Enteral/tube feeding initiation and management  ASSESSMENT:   65 y.o. female who initially presented with 1-2 days of a flu-like illness with progressive shortness of breath, generalized weakness, fatigue, associated with diarrhea. CXR in the ED was concerning for multifocal pneumonia. She was admitted to the hospital on 03/14/2014 and further workup was concerning for heart failure. She was found to have Mitral stenosis and Aortic stenosis. She underwent diuresis with little improvement in her symptoms, and her respiratory status actually has become worse over time. Was also treated for pneumonia, but CXR continued to demonstrate no resolution. Required intubation and transfer to the ICU on 2/15.   Labs reviewed: phosphorus elevated at 7.3.  Unable to complete Nutrition-Focused physical exam at this time.  Discussed patient with RN and physician. Per discussion with physician, okay to start TF today after CT and TEE.   Patient is currently intubated on ventilator support Temp (24hrs), Avg:99.3 F (37.4 C), Min:97.6 F (36.4 C), Max:100.3 F (37.9 C)  Propofol: 6.3 ml/h providing 166 kcals per day.  Diet Order:  Diet NPO time specified  Skin:  Reviewed, no issues  Last BM:  2/13  Height:   Ht Readings from Last 1  Encounters:  03/20/15 5\' 2"  (1.575 m)    Weight:   Wt Readings from Last 1 Encounters:  03/21/15 234 lb 5.6 oz (106.3 kg)    Ideal Body Weight:  50 kg  BMI:  Body mass index is 42.85 kg/(m^2).  Estimated Nutritional Needs:   Kcal:  03/23/15  Protein:  125 gm  Fluid:  1.6-1.8 L  EDUCATION NEEDS:   No education needs identified at this time  [id number], RD, LDN, CNSC Pager [phone] After Hours Pager [phone]

## 2015-03-21 NOTE — Progress Notes (Signed)
  Echocardiogram Echocardiogram Transesophageal has been performed.  Rachel Baker 03/21/2015, 1:20 PM

## 2015-03-21 NOTE — Progress Notes (Signed)
PULMONARY / CRITICAL CARE MEDICINE   Name: Rachel Baker MRN: [id number] DOB: [birth year]    ADMISSION DATE:  03/15/2015  CONSULTATION DATE:  03/20/2015  REFERRING MD:  Jonetta Osgood, MD  CHIEF COMPLAINT:  Shortness of breath.   HISTORY OF PRESENT ILLNESS:  Pt is encephelopathic; therefore, this HPI is obtained from chart review. Rachel Baker is a 65 y.o. female with PMH as outlined below. She initially presented with 1-2 days of a flu-like illness with progressive shortness of breath, generalized weakness, fatigue, associated with diarrhea. No fever, chills, LE edema, chest pain, nausea or vomiting. She had progressive SOB, was evaluated in the ED with CXR concerning for multifocal pneumonia, and troponin elevation. She was admitted to the hospital on 03/14/2014 and further workup was concerning for heart failure. She was found to have Mitral stenosis and Aortic stenosis. BNP was very low. She underwent diuresis with little improvement in her symptoms, and her respiratory status actually has become worse over time. Was also treated for pneumonia, but CXR continued to demonstrate no resolution. She developed a purple lesion on the tip of her right long finger with concern for embolic source. Additionally, with worsening CXR and respiratory status in spite of treatment, autoimmune workup was also performed which has been negative except for mildly elevated RF.   On 2/15 - she began to develop worsening respiratory status with increasing O2 requirement. She required transfer to the ICU, and intubation. Bronchoscopy performed as well.   SUBJECTIVE:  Intubated last night Admitted Sedated Vent Rate 16, FIO2 60%, PEEP 10 Mild AKI  VITAL SIGNS: BP 105/49 mmHg  Pulse 79  Temp(Src) 99.5 F (37.5 C) (Oral)  Resp 24  Ht 5' 2"  (1.575 m)  Wt 234 lb 5.6 oz (106.3 kg)  BMI 42.85 kg/m2  SpO2 98%  HEMODYNAMICS:    VENTILATOR SETTINGS: Vent Mode:  [-] PRVC FiO2 (%):  [60 %-100 %] 60  % Set Rate:  [16 bmp] 16 bmp Vt Set:  [400 mL] 400 mL PEEP:  [10 cmH20] 10 cmH20 Plateau Pressure:  [26 cmH20] 26 cmH20  INTAKE / OUTPUT: I/O last 3 completed shifts: In: 1300.7 [P.O.:1040; I.V.:260.7] Out: 2935 [Urine:2935]   PHYSICAL EXAMINATION: General: intubated, sedated.  Neuro: sedated, RASS -1   HEENT: [place]/AT. PERRL, sclerae anicteric. Cardiovascular: RRR, harsh systolic murmur, No gallops or rubs, S1/S2.  Lungs: Respirations labored, even, Coarse sounds bilaterally Abdomen: BS x 4, soft, NT/ND.  Musculoskeletal: No gross deformities, no edema.  Skin: Intact, warm, no rashes. Noted Blue discoloration of right long finger. ? Splinter hemorrhages.   LABS:  BMET  Recent Labs Lab 03/19/15 0804 03/20/15 0440 03/21/15 0400  NA 135 134* 136  K 4.5 4.2 4.0  CL 99* 98* 98*  CO2 20* 25 24  BUN 16 16 27*  CREATININE 0.71 0.90 1.22*  GLUCOSE 112* 110* 110*    Electrolytes  Recent Labs Lab 03/19/15 0804 03/20/15 0440 03/21/15 0400  CALCIUM 9.1 9.2 8.9  MG  --   --  2.0  PHOS  --   --  7.3*    CBC  Recent Labs Lab 03/17/15 0309 03/20/15 2000 03/21/15 0400  WBC 10.2 20.1* 15.8*  HGB 13.4 12.9 12.2  HCT 40.8 39.7 38.3  PLT 466* 586* 515*    Coag's No results for input(s): APTT, INR in the last 168 hours.  Sepsis Markers  Recent Labs Lab 03/15/15 1227 03/15/15 1539 03/16/15 1408 03/18/15 0440 03/20/15 0440  LATICACIDVEN 1.65 0.79  --   --   --  PROCALCITON  --   --  <0.10 <0.10 0.11    ABG  Recent Labs Lab 03/21/15 0013  PHART 7.341*  PCO2ART 49.4*  PO2ART 159.0*    Liver Enzymes  Recent Labs Lab 03/16/15 1158 03/17/15 0309  AST 27 24  ALT 21 22  ALKPHOS 59 59  BILITOT 0.2* 0.4  ALBUMIN 2.3* 2.3*    Cardiac Enzymes  Recent Labs Lab 03/15/15 2029 03/16/15 0232 03/16/15 1158  TROPONINI 0.97* 0.91* 0.61*    Glucose  Recent Labs Lab 03/20/15 1947  GLUCAP 145*    Imaging Dg Chest 2 View  03/20/2015  CLINICAL  DATA:  Shortness of breath with exertion. EXAM: CHEST  2 VIEW COMPARISON:  03/18/2015 and 03/15/2015 FINDINGS: Lungs are adequately inflated with multifocal airspace process over the mid to lower lungs bilaterally left worse than right. Findings demonstrate overall slight worsening. Possible small amount left pleural fluid. Findings likely due to multifocal infection. Borderline stable cardiomegaly. Remainder of the exam is unchanged. IMPRESSION: Multifocal airspace process over the mid to lower lungs left greater than right with slight interval worsening likely representing multifocal infection. Possible small amount left pleural fluid. Stable borderline cardiomegaly. Electronically Signed   By: Marin Olp M.D.   On: 03/20/2015 08:07   Dg Chest Port 1 View  03/20/2015  CLINICAL DATA:  Encounter for intubation, bronchoscopy and central line placement. EXAM: PORTABLE CHEST 1 VIEW COMPARISON:  03/20/2015 at 7:20 a.m. FINDINGS: Endotracheal tube tip projects 3.4 cm about the carina. Nasal/ orogastric tube passes below the diaphragm well into the stomach below the included field of view. Left internal jugular central venous line tip projects in the lower superior vena cava. No pneumothorax. Bilateral lung opacities are without significant change from the earlier study allowing differences in positioning and technique. No new lung abnormalities. IMPRESSION: 1. Endotracheal tube, nasal/orogastric tube and left internal jugular central venous line are well positioned. 2. No pneumothorax. Electronically Signed   By: Lajean Manes M.D.   On: 03/20/2015 19:32     STUDIES:  CXR 2/15 > ?Multifocal pneumonia worsening  Echo > LV EF 65-70%, G1DD, Moderate AS, Calcified Mitral Annulus mild stenosis / mild regurg, LA mod dialation, trivial pericardial effusion.  RUE U/S > Negative CXR 2/16 > No change, continued multifocal consolidation / L sided effusion.   CULTURES: BAL Culture 2/15 > Pneumocystis Smear DFA 2/15  > Legionella Culture 2/15 >  Fungus Culture with Smear 2/15 > Blood Culture 2/10 > NG x 5 days RVP 2/11 > Negative  ANTIBIOTICS: Levaquin 2/10 - 2/14  SIGNIFICANT EVENTS: 2/10 > Admitted concern multifocal pneumonia vs. CHF exacerbation 2/13- 14 > little response to diuresis 2/15 > Progressive respiratory failure requiring intubation, bronchoscopy performed  LINES/TUBES: CVL 2/15 > Foley 2/15 > ETT 2/15 >   DISCUSSION: 65 y/o F with PMH above, initially admitted with SOB thought to be due to multifocal pneumonia vs. CHF, but no response to initial treatment and actually worsening respiratory status now requiring intubation.   ASSESSMENT / PLAN:  PULMONARY A: Acute Hypoxic Respiratory Failure  Intubated Multifocal Pneumonia vs. Pulmonary Edema vs. Autoimmune Process.  Atypical Pneumonia - ? Mycobacterium, ? Fungal ? PE - at risk (unlikely with infiltrates)  P:   Full vent support. Wean as able. VAP prevention measures. SBT in AM if able. Bronchoscopy performed - awaiting cultures, and smear High res CT chest without contrast, Consider addition of CTA if needed to rule out PE.  IgE pending CRP, ESR - markedly  elevated. Infection vs. Autoimmune process.  Hold on steroids for now, unless clinically declining  CXR daily.  WBC trending down PCT negative.  CT chest dry Consider TV increase slight   CARDIOVASCULAR A:  Hypertension - severe range pressures post - CVL Resolved.  Mitral Stenosis Acute diastolic HF - not responding to diuresis.  Aortic Stenosis ? HOCM  P:  Lopressor Lasix, consider hold, crt rise TEE per cardiology hopefully today Cards following RHC to further assess as well.   RENAL A:   Diuresis for HF as above, over diruesis likely Mild AKI  Ca corrects 10.2 Hyperphosphatemia P:   Trending fluid balance, - 8L currently.  Hold on lasix for now.  BMP trend daily.  Mg, Phos repeat. saline to 50   GASTROINTESTINAL A:   Stable no  issues.  GI prophylaxis. Nutrition.  P:   SUP: Pantoprazole. NPO > tube feeds when able. After TEE  HEMATOLOGIC A:   Stable no issues Leukocytosis - ? Infection vs. Demargination.  VTE Prophylaxis. P:  SCD's / lovenox CBC now and in AM.   INFECTIOUS A:   Multifocal pneumonia Low Grade Temps - on ASA P:   Abx per ID ID on board > to see today.  Follow cultures as above. ID to see Follow Micro Pending BAL cultures PCT negative so far.  Leukocytosis likely demargination.   ENDOCRINE A:   Stable, No issues P:   SSI as needed Check TSH > normal.   NEUROLOGIC A:   Stable, no issues Sedated on Vent P:   Sedation:  Propofol fent Daily wua RASS goal: 0 to -1. Daily WUA.   Family updated: No family today.   Interdisciplinary Family Meeting v Palliative Care Meeting:  Due by: 03/27/2015  Paula Compton, MD Family Medicine - PGY 2   STAFF NOTE: I, Merrie Roof, MD FACP have personally reviewed patient's available data, including medical history, events of note, physical examination and test results as part of my evaluation. I have discussed with resident/NP and other care providers such as pharmacist, RN and RRT. In addition, I personally evaluated patient and elicited key findings of: no distress on vent, ronchi bilateral, coarse, fc well, Echo reviewed and pcxr, patchy int infiltrates do  Not favor pulm edema, murmur noted, for TEE, assess chest CT, if worsen vent would add empiric roids, ID to see follow bronch results, wean peep to goal 8-5 if able, rate remain on current 8 cc/kg, upright in chair position, lovenox, ssi, ppi, autoimmune overall neg and dsdna neg making limpman sacks low  The patient is critically ill with multiple organ systems failure and requires high complexity decision making for assessment and support, frequent evaluation and titration of therapies, application of advanced monitoring technologies and extensive interpretation of multiple  databases.   Critical Care Time devoted to patient care services described in this note is 35 Minutes. This time reflects time of care of this signee: Merrie Roof, MD FACP. This critical care time does not reflect procedure time, or teaching time or supervisory time of PA/NP/Med student/Med Resident etc but could involve care discussion time. Rest per NP/medical resident whose note is outlined above and that I agree with   Lavon Paganini. Titus Mould, MD, [place] Pgr: [facility] 03/21/2015 10:13 AM

## 2015-03-21 NOTE — Consult Note (Signed)
[facility]  Date of Admission:  03/15/2015  Date of Consult:  03/21/2015  Reason for Consult: Pneumonia Referring Physician: Elmo Putt  Impression/Recommendation Pneumonia, diffuse Would check toxo IgG/M with her hx of exposure to cats Would ask for viral cx on bronch,  Check CMV PCR on blood Await fungal studies from bronch Mycoplasma, chlamydia serologies  Comment- Her hx suggests cocci being from [place] (more likely if from non-urban area). Histo is also possible but less so.  CMV is a possibility but she does not have overt immunosuppression.  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  Her RF does not seem to be high enough to cause this syndrome.  Not clear if she may need open lung bx?  Thank you so much for this interesting consult,   Bobby Rumpf (pager) [phone] [email or web address]  Rachel Baker is an 65 y.o. female.  HPI: 66 yo F with hx of OSA, comes to hospital on 2-10 with 2 weeks of worsening SOB then multifocal pneumonia seen on CXR by her PCP. She was started on levofloxacin on day prior to adm, this was continued on adm.  She was also noted to have a positive troponin on admission.  She was also noted to have purplish discoloration of her R 3rd digit. She has noted this previously, transient in nature.  She had TTE on 2-12 showing: - Vigorous LV function; grade 1 diastolic dysfunction with elevated LV filling pressure; severe LVH; intracavitary gradient of 3.3 m/s; moderate LAE; severe MAC with mild MR and mild MS; moderate AS (mean gradient 20 mmHg). Her f/u CXR have shown slight worsening in hospital.  By 2-15 she had worsening hypoxia, dyspnea. She underwent intubation and bronch on 2-15.  Tmax 1003 on 2-15  Negative tests to date HIV/HCV Ab/Hep B S Ag/ TSH/ ANA / ANCA/ SCL70/ resp virus panel/ legionella urine Ag Pending- PCP, BAL studies   Past  Medical History  Diagnosis Date  . CAP (community acquired pneumonia) 03/15/2015  . Family history of adverse reaction to anesthesia     "my mother got combative"  . Complication of anesthesia     "I hallucinated when I had teeth worked on"  . Sleep apnea     "need breath rite strip sometimes" (03/15/2015)  . History of blood transfusion     "w/tooth implant"  . GERD (gastroesophageal reflux disease)   . Ocular migraine     "all the time" (03/15/2015)  . Arthritis     "all over; different joints" (03/15/2015)    Past Surgical History  Procedure Laterality Date  . Carpal tunnel release Bilateral   . Cataract extraction w/ intraocular lens  implant, bilateral Bilateral 2000s  . Mouth surgery      dental implant     Allergies  Allergen Reactions  . Penicillins Hives and Itching    Has patient had a PCN reaction causing immediate rash, facial/tongue/throat swelling, SOB or lightheadedness with hypotension: Yes Has patient had a PCN reaction causing severe rash involving mucus membranes or skin necrosis: No Has patient had a PCN reaction that required hospitalization No Has patient had a PCN reaction occurring within the last 10 years: No If all of the above answers are "NO", then may proceed with Cephalosporin use.   . Sulfa Antibiotics Hives and Itching  . Ceclor [Cefaclor] Itching and Rash    Medications:  Scheduled: . antiseptic oral rinse  7 mL  Mouth Rinse QID  . aspirin  81 mg Per Tube Daily  . chlorhexidine gluconate  15 mL Mouth Rinse BID  . enoxaparin (LOVENOX) injection  40 mg Subcutaneous Q24H  . fentaNYL (SUBLIMAZE) injection  50 mcg Intravenous Once  . pantoprazole (PROTONIX) IV  40 mg Intravenous Daily    Abtx:  Anti-infectives    Start     Dose/Rate Route Frequency Ordered Stop   03/18/15 1400  levofloxacin (LEVAQUIN) tablet 750 mg     750 mg Oral Daily 03/18/15 1131 03/19/15 1500   03/16/15 1500  levofloxacin (LEVAQUIN) IVPB 750 mg  Status:  Discontinued      750 mg 100 mL/hr over 90 Minutes Intravenous Every 24 hours 03/15/15 1657 03/18/15 1131   03/15/15 1345  levofloxacin (LEVAQUIN) IVPB 750 mg     750 mg 100 mL/hr over 90 Minutes Intravenous  Once 03/15/15 1330 03/15/15 1527     Total days of antibiotics: 6 levaquin          Social History:  reports that she has quit smoking. Her smoking use included Cigarettes. She has a 9.57 pack-year smoking history. She has never used smokeless tobacco. She reports that she drinks alcohol. She reports that she does not use illicit drugs. [name].   History reviewed. No pertinent family history.  General ROS: unobtainable, pt on vent  Blood pressure 110/55, pulse 80, temperature 99.1 F (37.3 C), temperature source Oral, resp. rate 24, height _0  (1.575 m), weight 106.3 kg (234 lb 5.6 oz), SpO2 92 %. General appearance: alert, cooperative, no distress and pale Eyes: negative findings: conjunctivae and sclerae normal and pupils equal, round, reactive to light and accomodation Throat: no thrush, ET in place Neck: L neck IJ clean, no erythema.  Lungs: rhonchi bilaterally Heart: regular rate and rhythm and systolic murmur: early systolic 3/6, crescendo at 2nd left intercostal space, at 2nd right intercostal space Abdomen: normal findings: bowel sounds normal and soft, non-tender Extremities: edema none and R 3rd finger tender, mild discoloration of nail.    Results for orders placed or performed during the hospital encounter of 03/15/15 (from the past 48 hour(s))  Procalcitonin     Status: None   Collection Time: 03/20/15  4:40 AM  Result Value Ref Range   Procalcitonin 0.11 ng/mL    Comment:        Interpretation: PCT (Procalcitonin) <= 0.5 ng/mL: Systemic infection (sepsis) is not likely. Local bacterial infection is possible. (NOTE)         ICU PCT Algorithm               Non ICU PCT Algorithm    ----------------------------     ------------------------------          PCT < 0.25 ng/mL                 PCT < 0.1 ng/mL     Stopping of antibiotics            Stopping of antibiotics       strongly encouraged.               strongly encouraged.    ----------------------------     ------------------------------       PCT level decrease by               PCT < 0.25 ng/mL       >= 80% from peak PCT       OR PCT 0.25 -  0.5 ng/mL          Stopping of antibiotics                                             encouraged.     Stopping of antibiotics           encouraged.    ----------------------------     ------------------------------       PCT level decrease by              PCT >= 0.25 ng/mL       < 80% from peak PCT        AND PCT >= 0.5 ng/mL            Continuin g antibiotics                                              encouraged.       Continuing antibiotics            encouraged.    ----------------------------     ------------------------------     PCT level increase compared          PCT > 0.5 ng/mL         with peak PCT AND          PCT >= 0.5 ng/mL             Escalation of antibiotics                                          strongly encouraged.      Escalation of antibiotics        strongly encouraged.   Basic metabolic panel     Status: Abnormal   Collection Time: 03/20/15  4:40 AM  Result Value Ref Range   Sodium 134 (L) 135 - 145 mmol/L   Potassium 4.2 3.5 - 5.1 mmol/L   Chloride 98 (L) 101 - 111 mmol/L   CO2 25 22 - 32 mmol/L   Glucose, Bld 110 (H) 65 - 99 mg/dL   BUN 16 6 - 20 mg/dL   Creatinine, Ser 0.90 0.44 - 1.00 mg/dL   Calcium 9.2 8.9 - 10.3 mg/dL   GFR calc non Af Amer >60 >60 mL/min   GFR calc Af Amer >60 >60 mL/min    Comment: (NOTE) The eGFR has been calculated using the CKD EPI equation. This calculation has not been validated in all clinical situations. eGFR's persistently <60 mL/min signify possible Chronic Kidney Disease.    Anion gap 11 5 - 15  Brain natriuretic peptide     Status: None   Collection Time: 03/20/15   1:33 PM  Result Value Ref Range   B Natriuretic Peptide 98.0 0.0 - 100.0 pg/mL  Culture, bal-quantitative     Status: None (Preliminary result)   Collection Time: 03/20/15  6:43 PM  Result Value Ref Range   Specimen Description BRONCHIAL ALVEOLAR LAVAGE    Special Requests LLL    Gram Stain      RARE WBC PRESENT, PREDOMINANTLY PMN NO SQUAMOUS EPITHELIAL CELLS SEEN NO ORGANISMS SEEN Performed at [facility]  Culture PENDING    Report Status PENDING   Fungus Culture with Smear     Status: None (Preliminary result)   Collection Time: 03/20/15  6:43 PM  Result Value Ref Range   Specimen Description BRONCHIAL ALVEOLAR LAVAGE    Special Requests LLL    Fungal Smear      NO YEAST OR FUNGAL ELEMENTS SEEN Performed at [facility]    Culture      CULTURE IN PROGRESS FOR FOUR WEEKS Performed at [facility]    Report Status PENDING   MRSA PCR Screening     Status: None   Collection Time: 03/20/15  6:43 PM  Result Value Ref Range   MRSA by PCR NEGATIVE NEGATIVE    Comment:        The GeneXpert MRSA Assay (FDA approved for NASAL specimens only), is one component of a comprehensive MRSA colonization surveillance program. It is not intended to diagnose MRSA infection nor to guide or monitor treatment for MRSA infections.   Glucose, capillary     Status: Abnormal   Collection Time: 03/20/15  7:47 PM  Result Value Ref Range   Glucose-Capillary 145 (H) 65 - 99 mg/dL  Triglycerides     Status: None   Collection Time: 03/20/15  8:00 PM  Result Value Ref Range   Triglycerides 148 <150 mg/dL  CBC with Differential/Platelet     Status: Abnormal   Collection Time: 03/20/15  8:00 PM  Result Value Ref Range   WBC 20.1 (H) 4.0 - 10.5 K/uL   RBC 4.40 3.87 - 5.11 MIL/uL   Hemoglobin 12.9 12.0 - 15.0 g/dL   HCT 39.7 36.0 - 46.0 %   MCV 90.2 78.0 - 100.0 fL   MCH 29.3 26.0 - 34.0 pg   MCHC 32.5 30.0 - 36.0 g/dL   RDW 13.3 11.5 - 15.5 %   Platelets 586 (H)  150 - 400 K/uL   Neutrophils Relative % 87 %   Neutro Abs 17.5 (H) 1.7 - 7.7 K/uL   Lymphocytes Relative 6 %   Lymphs Abs 1.1 0.7 - 4.0 K/uL   Monocytes Relative 7 %   Monocytes Absolute 1.4 (H) 0.1 - 1.0 K/uL   Eosinophils Relative 0 %   Eosinophils Absolute 0.1 0.0 - 0.7 K/uL   Basophils Relative 0 %   Basophils Absolute 0.0 0.0 - 0.1 K/uL  Sedimentation rate     Status: Abnormal   Collection Time: 03/20/15  8:27 PM  Result Value Ref Range   Sed Rate 96 (H) 0 - 22 mm/hr  C-reactive protein     Status: Abnormal   Collection Time: 03/20/15  8:27 PM  Result Value Ref Range   CRP 24.1 (H) <1.0 mg/dL  TSH     Status: None   Collection Time: 03/20/15  8:30 PM  Result Value Ref Range   TSH 2.691 0.350 - 4.500 uIU/mL  I-STAT 3, arterial blood gas (G3+)     Status: Abnormal   Collection Time: 03/21/15 12:13 AM  Result Value Ref Range   pH, Arterial 7.341 (L) 7.350 - 7.450   pCO2 arterial 49.4 (H) 35.0 - 45.0 mmHg   pO2, Arterial 159.0 (H) 80.0 - 100.0 mmHg   Bicarbonate 26.9 (H) 20.0 - 24.0 mEq/L   TCO2 28 0 - 100 mmol/L   O2 Saturation 99.0 %   Patient temperature 97.6 F    Collection site RADIAL, ALLEN'S TEST ACCEPTABLE    Drawn by RT  Sample type ARTERIAL   Basic metabolic panel     Status: Abnormal   Collection Time: 03/21/15  4:00 AM  Result Value Ref Range   Sodium 136 135 - 145 mmol/L   Potassium 4.0 3.5 - 5.1 mmol/L   Chloride 98 (L) 101 - 111 mmol/L   CO2 24 22 - 32 mmol/L   Glucose, Bld 110 (H) 65 - 99 mg/dL   BUN 27 (H) 6 - 20 mg/dL   Creatinine, Ser 1.22 (H) 0.44 - 1.00 mg/dL   Calcium 8.9 8.9 - 10.3 mg/dL   GFR calc non Af Amer 45 (L) >60 mL/min   GFR calc Af Amer 53 (L) >60 mL/min    Comment: (NOTE) The eGFR has been calculated using the CKD EPI equation. This calculation has not been validated in all clinical situations. eGFR's persistently <60 mL/min signify possible Chronic Kidney Disease.    Anion gap 14 5 - 15  CBC     Status: Abnormal    Collection Time: 03/21/15  4:00 AM  Result Value Ref Range   WBC 15.8 (H) 4.0 - 10.5 K/uL   RBC 4.23 3.87 - 5.11 MIL/uL   Hemoglobin 12.2 12.0 - 15.0 g/dL   HCT 38.3 36.0 - 46.0 %   MCV 90.5 78.0 - 100.0 fL   MCH 28.8 26.0 - 34.0 pg   MCHC 31.9 30.0 - 36.0 g/dL   RDW 13.5 11.5 - 15.5 %   Platelets 515 (H) 150 - 400 K/uL  Magnesium     Status: None   Collection Time: 03/21/15  4:00 AM  Result Value Ref Range   Magnesium 2.0 1.7 - 2.4 mg/dL  Phosphorus     Status: Abnormal   Collection Time: 03/21/15  4:00 AM  Result Value Ref Range   Phosphorus 7.3 (H) 2.5 - 4.6 mg/dL      Component Value Date/Time   SDES BRONCHIAL ALVEOLAR LAVAGE 03/20/2015 1843   SDES BRONCHIAL ALVEOLAR LAVAGE 03/20/2015 1843   SPECREQUEST LLL 03/20/2015 1843   SPECREQUEST LLL 03/20/2015 1843   CULT PENDING 03/20/2015 1843   CULT  03/20/2015 1843    CULTURE IN PROGRESS FOR FOUR WEEKS Performed at [facility] PENDING 03/20/2015 1843   REPTSTATUS PENDING 03/20/2015 1843   Dg Chest 2 View  03/20/2015  CLINICAL DATA:  Shortness of breath with exertion. EXAM: CHEST  2 VIEW COMPARISON:  03/18/2015 and 03/15/2015 FINDINGS: Lungs are adequately inflated with multifocal airspace process over the mid to lower lungs bilaterally left worse than right. Findings demonstrate overall slight worsening. Possible small amount left pleural fluid. Findings likely due to multifocal infection. Borderline stable cardiomegaly. Remainder of the exam is unchanged. IMPRESSION: Multifocal airspace process over the mid to lower lungs left greater than right with slight interval worsening likely representing multifocal infection. Possible small amount left pleural fluid. Stable borderline cardiomegaly. Electronically Signed   By: Marin Olp M.D.   On: 03/20/2015 08:07   Portable Chest Xray  03/21/2015  CLINICAL DATA:  Acute on chronic respiratory failure, community-acquired pneumonia, acute CHF, known aortic and  mitral stenosis EXAM: PORTABLE CHEST 1 VIEW COMPARISON:  Portable chest x-ray of March 20, 2015 FINDINGS: The lungs are well-expanded. The left hemidiaphragm is slightly better demonstrated today. Patchy confluent interstitial and alveolar opacities persist bilaterally. The cardiac silhouette is enlarged. The pulmonary vascularity is mildly prominent centrally but stable. The endotracheal tube tip lies 3 cm above the carina. The esophagogastric tube tip projects below  the inferior margin of the image. The left internal jugular venous catheter tip projects over the midportion of the SVC. IMPRESSION: Persistent bilateral pneumonia. Superimposed low-grade CHF. Slight interval improvement since yesterday's study. The support tubes are in reasonable position. Electronically Signed   By: David  [place] M.D.   On: 03/21/2015 07:44   Dg Chest Port 1 View  03/20/2015  CLINICAL DATA:  Encounter for intubation, bronchoscopy and central line placement. EXAM: PORTABLE CHEST 1 VIEW COMPARISON:  03/20/2015 at 7:20 a.m. FINDINGS: Endotracheal tube tip projects 3.4 cm about the carina. Nasal/ orogastric tube passes below the diaphragm well into the stomach below the included field of view. Left internal jugular central venous line tip projects in the lower superior vena cava. No pneumothorax. Bilateral lung opacities are without significant change from the earlier study allowing differences in positioning and technique. No new lung abnormalities. IMPRESSION: 1. Endotracheal tube, nasal/orogastric tube and left internal jugular central venous line are well positioned. 2. No pneumothorax. Electronically Signed   By: Amie Portland M.D.   On: 03/20/2015 19:32   Recent Results (from the past 240 hour(s))  Culture, blood (routine x 2) Call MD if unable to obtain prior to antibiotics being given     Status: None   Collection Time: 03/15/15  8:29 PM  Result Value Ref Range Status   Specimen Description BLOOD LEFT ANTECUBITAL   Final   Special Requests IN PEDIATRIC BOTTLE 4CC  Final   Culture NO GROWTH 5 DAYS  Final   Report Status 03/20/2015 FINAL  Final  Culture, blood (routine x 2) Call MD if unable to obtain prior to antibiotics being given     Status: None   Collection Time: 03/15/15  8:34 PM  Result Value Ref Range Status   Specimen Description BLOOD RIGHT HAND  Final   Special Requests BOTTLES DRAWN AEROBIC ONLY 5CC  Final   Culture NO GROWTH 5 DAYS  Final   Report Status 03/20/2015 FINAL  Final  Respiratory virus panel     Status: None   Collection Time: 03/16/15 12:30 PM  Result Value Ref Range Status   Respiratory Syncytial Virus A Negative Negative Final   Respiratory Syncytial Virus B Negative Negative Final   Influenza A Negative Negative Final   Influenza B Negative Negative Final   Parainfluenza 1 Negative Negative Final   Parainfluenza 2 Negative Negative Final   Parainfluenza 3 Negative Negative Final   Metapneumovirus Negative Negative Final   Rhinovirus Negative Negative Final   Adenovirus Negative Negative Final    Comment: (NOTE) Performed At: [facility] [address] [place], [place] [id number] Mila Homer MD GT:[id number]   Culture, bal-quantitative     Status: None (Preliminary result)   Collection Time: 03/20/15  6:43 PM  Result Value Ref Range Status   Specimen Description BRONCHIAL ALVEOLAR LAVAGE  Final   Special Requests LLL  Final   Gram Stain   Final    RARE WBC PRESENT, PREDOMINANTLY PMN NO SQUAMOUS EPITHELIAL CELLS SEEN NO ORGANISMS SEEN Performed at [facility]    Culture PENDING  Incomplete   Report Status PENDING  Incomplete  Fungus Culture with Smear     Status: None (Preliminary result)   Collection Time: 03/20/15  6:43 PM  Result Value Ref Range Status   Specimen Description BRONCHIAL ALVEOLAR LAVAGE  Final   Special Requests LLL  Final   Fungal Smear   Final    NO YEAST OR FUNGAL ELEMENTS SEEN Performed at  [facility]  [facility]   Final    CULTURE IN PROGRESS FOR FOUR WEEKS Performed at [facility]    Report Status PENDING  Incomplete  MRSA PCR Screening     Status: None   Collection Time: 03/20/15  6:43 PM  Result Value Ref Range Status   MRSA by PCR NEGATIVE NEGATIVE Final    Comment:        The GeneXpert MRSA Assay (FDA approved for NASAL specimens only), is one component of a comprehensive MRSA colonization surveillance program. It is not intended to diagnose MRSA infection nor to guide or monitor treatment for MRSA infections.     03/21/2015, 10:53 AM   LOS: 6 days    Records and images were personally reviewed where available.

## 2015-03-21 NOTE — H&P (Signed)
    INTERVAL PROCEDURE H&P  History and Physical Interval Note:  03/21/2015 12:12 PM  Rachel Baker has presented today for their planned procedure. The various methods of treatment have been discussed with the patient and family. After consideration of risks, benefits and other options for treatment, the patient has consented to the procedure.  The patients' outpatient history has been reviewed, patient examined, and no change in status from most recent office note within the past 30 days. I have reviewed the patients' chart and labs and will proceed as planned. Questions were answered to the patient's satisfaction.   Rachel Nose, MD, [facility] Attending Cardiologist [facility]  Rachel Baker 03/21/2015, 12:12 PM

## 2015-03-21 NOTE — Care Management Note (Signed)
Case Management Note  Patient Details  Name: Rachel Baker MRN: [id number] Date of Birth: 1950-03-21  Subjective/Objective:   Pt admitted with SOB that worsened -  Lead to intubation                 Action/Plan:  Pt is independent from home with sister and brother-in-law   Expected Discharge Date:                  Expected Discharge Plan:  Home/Self Care  In-House Referral:     Discharge planning Services  CM Consult  Post Acute Care Choice:    Choice offered to:     DME Arranged:    DME Agency:     HH Arranged:    HH Agency:     Status of Service:  In process, will continue to follow  Medicare Important Message Given:  Yes Date Medicare IM Given:    Medicare IM give by:    Date Additional Medicare IM Given:    Additional Medicare Important Message give by:     If discussed at Long Length of Stay Meetings, dates discussed:    Additional Comments:  Cherylann Parr, RN 03/21/2015, 2:18 PM

## 2015-03-22 ENCOUNTER — Inpatient Hospital Stay (HOSPITAL_COMMUNITY): Payer: Medicare HMO

## 2015-03-22 DIAGNOSIS — I058 Other rheumatic mitral valve diseases: Secondary | ICD-10-CM

## 2015-03-22 DIAGNOSIS — I5189 Other ill-defined heart diseases: Secondary | ICD-10-CM | POA: Insufficient documentation

## 2015-03-22 DIAGNOSIS — J180 Bronchopneumonia, unspecified organism: Secondary | ICD-10-CM

## 2015-03-22 DIAGNOSIS — I421 Obstructive hypertrophic cardiomyopathy: Secondary | ICD-10-CM

## 2015-03-22 LAB — POCT I-STAT 3, ART BLOOD GAS (G3+)
ACID-BASE EXCESS: 2 mmol/L (ref 0.0–2.0)
BICARBONATE: 27.4 meq/L — AB (ref 20.0–24.0)
O2 Saturation: 92 %
TCO2: 29 mmol/L (ref 0–100)
pCO2 arterial: 49.6 mmHg — ABNORMAL HIGH (ref 35.0–45.0)
pH, Arterial: 7.356 (ref 7.350–7.450)
pO2, Arterial: 73 mmHg — ABNORMAL LOW (ref 80.0–100.0)

## 2015-03-22 LAB — GLUCOSE, CAPILLARY
GLUCOSE-CAPILLARY: 105 mg/dL — AB (ref 65–99)
GLUCOSE-CAPILLARY: 90 mg/dL (ref 65–99)
Glucose-Capillary: 91 mg/dL (ref 65–99)
Glucose-Capillary: 105 mg/dL — ABNORMAL HIGH (ref 65–99)
Glucose-Capillary: 115 mg/dL — ABNORMAL HIGH (ref 65–99)
Glucose-Capillary: 101 mg/dL — ABNORMAL HIGH (ref 65–99)

## 2015-03-22 LAB — CBC
HCT: 37.7 % (ref 36.0–46.0)
Hemoglobin: 11.9 g/dL — ABNORMAL LOW (ref 12.0–15.0)
MCH: 29 pg (ref 26.0–34.0)
MCHC: 31.6 g/dL (ref 30.0–36.0)
MCV: 91.7 fL (ref 78.0–100.0)
PLATELETS: 492 10*3/uL — AB (ref 150–400)
RBC: 4.11 MIL/uL (ref 3.87–5.11)
RDW: 13.5 % (ref 11.5–15.5)
WBC: 14.5 10*3/uL — AB (ref 4.0–10.5)

## 2015-03-22 LAB — BASIC METABOLIC PANEL
Anion gap: 10 (ref 5–15)
BUN: 25 mg/dL — AB (ref 6–20)
CALCIUM: 9 mg/dL (ref 8.9–10.3)
CHLORIDE: 101 mmol/L (ref 101–111)
CO2: 27 mmol/L (ref 22–32)
CREATININE: 0.71 mg/dL (ref 0.44–1.00)
GFR calc non Af Amer: >60 mL/min (ref >60)
Glucose, Bld: 113 mg/dL — ABNORMAL HIGH (ref 65–99)
Potassium: 4.1 mmol/L (ref 3.5–5.1)
SODIUM: 138 mmol/L (ref 135–145)

## 2015-03-22 LAB — HISTOPLASMA ANTIGEN, URINE: Histoplasma Antigen, urine: 0 ng/mL (ref 0.00–0.49)

## 2015-03-22 LAB — TOXOPLASMA ANTIBODIES- IGG AND  IGM
Toxoplasma Antibody- IgM: <3 AU/mL (ref 0.0–7.9)
Toxoplasma IgG Ratio: <3 IU/mL (ref 0.0–7.1)

## 2015-03-22 LAB — PHOSPHORUS: PHOSPHORUS: 4.1 mg/dL (ref 2.5–4.6)

## 2015-03-22 LAB — IGE: IgE (Immunoglobulin E), Serum: 7 IU/mL (ref 0–100)

## 2015-03-22 LAB — MAGNESIUM: MAGNESIUM: 2.2 mg/dL (ref 1.7–2.4)

## 2015-03-22 MED ORDER — AZITHROMYCIN 500 MG PO TABS
500.0000 mg | ORAL_TABLET | Freq: Every day | ORAL | Status: AC
  Administered 2015-03-22: 500 mg via ORAL
  Filled 2015-03-22: qty 1

## 2015-03-22 MED ORDER — DEXTROSE 5 % IV SOLN
2.0000 g | Freq: Three times a day (TID) | INTRAVENOUS | Status: DC
  Administered 2015-03-22 – 2015-03-24 (×6): 2 g via INTRAVENOUS
  Filled 2015-03-22 (×8): qty 2

## 2015-03-22 MED ORDER — AZITHROMYCIN 250 MG PO TABS
250.0000 mg | ORAL_TABLET | Freq: Every day | ORAL | Status: DC
  Administered 2015-03-23: 250 mg via ORAL
  Filled 2015-03-22 (×2): qty 1

## 2015-03-22 NOTE — Progress Notes (Addendum)
PULMONARY / CRITICAL CARE MEDICINE   Name: Rachel Baker MRN: [id number] DOB: 11-05-1950    ADMISSION DATE:  03/15/2015  CONSULTATION DATE:  03/20/2015  REFERRING MD:  Jonetta Osgood, MD  CHIEF COMPLAINT:  Shortness of breath.   HISTORY OF PRESENT ILLNESS:  Pt is encephelopathic; therefore, this HPI is obtained from chart review. Rachel Baker is a 65 y.o. female with PMH as outlined below. She initially presented with 1-2 days of a flu-like illness with progressive shortness of breath, generalized weakness, fatigue, associated with diarrhea. No fever, chills, LE edema, chest pain, nausea or vomiting. She had progressive SOB, was evaluated in the ED with CXR concerning for multifocal pneumonia, and troponin elevation. She was admitted to the hospital on 03/14/2014 and further workup was concerning for heart failure. She was found to have Mitral stenosis and Aortic stenosis. BNP was very low. She underwent diuresis with little improvement in her symptoms, and her respiratory status actually has become worse over time. Was also treated for pneumonia, but CXR continued to demonstrate no resolution. She developed a purple lesion on the tip of her right long finger with concern for embolic source. Additionally, with worsening CXR and respiratory status in spite of treatment, autoimmune workup was also performed which has been negative except for mildly elevated RF.   On 2/15 - she began to develop worsening respiratory status with increasing O2 requirement. She required transfer to the ICU, and intubation. Bronchoscopy performed as well.   SUBJECTIVE:  Intubated Febrile overnight.  TEE yesterday with no endocarditis, but nodules found Vent Rate 16, FIO2 60%, PEEP 10 Mild AKI resolved  VITAL SIGNS: BP 110/52 mmHg  Pulse 88  Temp(Src) 100.8 F (38.2 C) (Core (Comment))  Resp 25  Ht 5' 2"  (1.575 m)  Wt 234 lb 9.1 oz (106.4 kg)  BMI 42.89 kg/m2  SpO2 96%  HEMODYNAMICS:     VENTILATOR SETTINGS: Vent Mode:  [-] PRVC FiO2 (%):  [40 %-50 %] 40 % Set Rate:  [16 bmp] 16 bmp Vt Set:  [400 mL] 400 mL PEEP:  [10 cmH20] 10 cmH20 Plateau Pressure:  [15 cmH20-19 cmH20] 16 cmH20  INTAKE / OUTPUT: I/O last 3 completed shifts: In: 2259.8 [I.V.:1778.1; Other:20; NG/GT:461.7] Out: 1157 [WIOMB:5597]   PHYSICAL EXAMINATION: General: intubated, following commands Neuro: RASS -1, No focal findings.    HEENT: [place]/AT. PERRL, sclerae anicteric. Cardiovascular: RRR, harsh systolic murmur remains, No gallops or rubs, S1/S2.  Lungs: Respirations labored, even, Coarse sounds bilaterally Abdomen: BS x 4, soft, NT/ND.  Musculoskeletal: No gross deformities, no edema.  Skin: Intact, warm, no rashes. Noted Blue discoloration of right long finger. Splinter hemorrhages > improving.    LABS:  BMET  Recent Labs Lab 03/20/15 0440 03/21/15 0400 03/22/15 0330  NA 134* 136 138  K 4.2 4.0 4.1  CL 98* 98* 101  CO2 25 24 27   BUN 16 27* 25*  CREATININE 0.90 1.22* 0.71  GLUCOSE 110* 110* 113*    Electrolytes  Recent Labs Lab 03/20/15 0440 03/21/15 0400 03/22/15 0330  CALCIUM 9.2 8.9 9.0  MG  --  2.0 2.2  PHOS  --  7.3* 4.1    CBC  Recent Labs Lab 03/20/15 2000 03/21/15 0400 03/22/15 0330  WBC 20.1* 15.8* 14.5*  HGB 12.9 12.2 11.9*  HCT 39.7 38.3 37.7  PLT 586* 515* 492*    Coag's No results for input(s): APTT, INR in the last 168 hours.  Sepsis Markers  Recent Labs Lab 03/15/15 1227  03/15/15 1539 03/16/15 1408 03/18/15 0440 03/20/15 0440  LATICACIDVEN 1.65 0.79  --   --   --   PROCALCITON  --   --  <0.10 <0.10 0.11    ABG  Recent Labs Lab 03/21/15 0013 03/22/15 0245  PHART 7.341* 7.356  PCO2ART 49.4* 49.6*  PO2ART 159.0* 73.0*    Liver Enzymes  Recent Labs Lab 03/16/15 1158 03/17/15 0309  AST 27 24  ALT 21 22  ALKPHOS 59 59  BILITOT 0.2* 0.4  ALBUMIN 2.3* 2.3*    Cardiac Enzymes  Recent Labs Lab 03/15/15 2029  03/16/15 0232 03/16/15 1158  TROPONINI 0.97* 0.91* 0.61*    Glucose  Recent Labs Lab 03/20/15 1947 03/21/15 2041 03/22/15 0004 03/22/15 0403  GLUCAP 145* 89 90 91    Imaging Dg Chest Port 1 View  03/22/2015  CLINICAL DATA:  Intubated EXAM: PORTABLE CHEST 1 VIEW COMPARISON:  03/21/2015 FINDINGS: Cardiomegaly again noted. Stable endotracheal and NG tube position. Persistent patchy airspace disease/pneumonia bilaterally. Slight improvement in aeration right midlung. Question small left pleural effusion. Central mild vascular congestion without convincing pulmonary edema. Stable left IJ central line with tip in SVC. IMPRESSION: Stable support apparatus. Persistent patchy airspace disease/pneumonia bilaterally. Slight improvement in aeration right midlung. Question small left pleural effusion. Central mild vascular congestion without convincing pulmonary edema. Electronically Signed   By: Lahoma Crocker M.D.   On: 03/22/2015 08:13     STUDIES:  CXR 2/15 > ?Multifocal pneumonia worsening  Echo > LV EF 65-70%, G1DD, Moderate AS, Calcified Mitral Annulus mild stenosis / mild regurg, LA mod dialation, trivial pericardial effusion.  RUE U/S > Negative CXR 2/16 > No change, continued multifocal consolidation / L sided effusion.  TEE 2/16 > Nodules, HOCM, No endocarditis visible.  CXR 2/17 > infiltrates remain. Little improvement.   CULTURES: BAL Culture 2/15 >  BAL CMV 2/16 > Pneumocystis Smear DFA 2/15 > Legionella Culture 2/15 >  Fungus Culture with Smear 2/15 > no yeast or fungus seen.  Blood Culture 2/10 > NG x 5 days RVP 2/11 > Negative  ANTIBIOTICS: Levaquin 2/10 - 2/14  SIGNIFICANT EVENTS: 2/10 > Admitted concern multifocal pneumonia vs. CHF exacerbation 2/13- 14 > little response to diuresis 2/15 > Progressive respiratory failure requiring intubation, bronchoscopy performed 2/16 > TEE not suggestive of endocarditis, has nodules on Mitral and aortic valves with possible  embnolization  LINES/TUBES: CVL 2/15 > Foley 2/15 > ETT 2/15 >   DISCUSSION: 64 y/o F with PMH above, initially admitted with SOB thought to be due to multifocal pneumonia vs. CHF, but no response to initial treatment and actually worsening respiratory status now requiring intubation.   ASSESSMENT / PLAN:  PULMONARY A: Acute Hypoxic Respiratory Failure  Multifocal Pneumonia vs. Pulmonary Edema vs. Autoimmune Process.  Atypical Pneumonia - ? Mycobacterium, ? Fungal  ? PE - at risk (unlikely with infiltrates)  P:   Full vent support. Peep 10 remains for weaning cpap 10 ps 10  VAP prevention measures. High res CT chest without contrast > STILL WAITING- will be done today   IgE negative CMV pcr BAL sent CRP, ESR - markedly elevated. Infection vs. Autoimmune process.  Hold on steroids for now, unless clinically declining  CXR daily.  WBC trending down PCT negative.   CARDIOVASCULAR A:  Hypertension - severe range pressures post - CVL Resolved.  Mitral Stenosis Acute diastolic HF - not responding to diuresis.  Aortic Stenosis - HOCM on TEE  P:  Lopressor Lasix holding for  now, with hold cr improved TEE > Mitral and Aortic valve nodules Cards following RHC to further assess   RENAL A:   Diuresis for HF as above, over diruesis likely Mild AKI  Hyperphosphatemia - resolved P:   Trending fluid balance, - 7L currently.  Hold on lasix for now.  BMP trend daily.  saline to 50 > kvo as crt resolved  GASTROINTESTINAL A:   Stable no issues.  GI prophylaxis. Nutrition.  P:   SUP: Pantoprazole. NPO > tube feeds 2/16  HEMATOLOGIC A:   Stable no issues Leukocytosis - resolving.  VTE Prophylaxis. P:  SCD's / lovenox CBC in am.   INFECTIOUS A:   Multifocal pneumonia, await CT R/o aseptic endocarditis, autoimmune related, limpman sachs?, cancer? Febrile overnight.  P:   Abx per ID ID on board > Toxo immunoglobulin negative, cryptococcal ag negative.  CMV  culture pending Histo ag pending CMV pcr pending Fungal Ab pending Mycoplasma ab pending Chlamydia panel pending Legionella cx pending Pneumocystis smear pending Follow cultures as above. Follow Micro Pending BAL cultures PCT negative so far.  Leukocytosis likely demargination.  Unclear etiology at this point as workup has been mostly negative.   AUtoimmune A: r/o autoimmune process Dsdna neg, nodules on valves, send smith ab  ENDOCRINE A:   Stable, No issues P:   SSI as needed Check TSH > normal.   NEUROLOGIC A:   Stable, no issues Sedated on Vent P:   Sedation:  Propofol fent Daily wua RASS goal: 0 to -1. Daily WUA.   Family updated: No family today.   Interdisciplinary Family Meeting v Palliative Care Meeting:  Due by: 03/27/2015  Paula Compton, MD Family Medicine - PGY 2  STAFF NOTE: I, Merrie Roof, MD FACP have personally reviewed patient's available data, including medical history, events of note, physical examination and test results as part of my evaluation. I have discussed with resident/NP and other care providers such as pharmacist, RN and RRT. In addition, I personally evaluated patient and elicited key findings of: awake, alert, coarse BS, nodules on valves, r/o autoimmune noninfectious, r/o malignancy, r/o aseptic, assess CT chest, assess blood for cytology , send smith ab plus already known dsdna, wean cpap 10, ps 10, ambulate on vent if able, pt consult for this, no lasix, crt better with pos balance, d/w ID how to find HACEK organisms, no role empiric steroids at this stage, even balance goal, WUA, if reembolize to tissue, would BX this The patient is critically ill with multiple organ systems failure and requires high complexity decision making for assessment and support, frequent evaluation and titration of therapies, application of advanced monitoring technologies and extensive interpretation of multiple databases.   Critical Care Time  devoted to patient care services described in this note is 30 Minutes. This time reflects time of care of this signee: Merrie Roof, MD FACP. This critical care time does not reflect procedure time, or teaching time or supervisory time of PA/NP/Med student/Med Resident etc but could involve care discussion time. Rest per NP/medical resident whose note is outlined above and that I agree with   Lavon Paganini. Titus Mould, MD, [place] Pgr: [facility] 03/22/2015 9:25 AM

## 2015-03-22 NOTE — Progress Notes (Signed)
Pt. Was transported to CT & back to room 2M13 without any complications.

## 2015-03-22 NOTE — Progress Notes (Signed)
PT Cancellation/Discharge Note  Patient Details Name: Rachel Baker MRN: [id number] DOB: 11-01-1950   Cancelled Treatment:    Reason Eval/Treat Not Completed: Medical issues which prohibited therapy.  Patient has had increasing respiratory failure.  To ICU and intubated on 03/20/15.  Will d/c PT at this time. **MD:  Please re-consult Physical Therapy when appropriate for patient.  Thank you.   Vena Austria 03/22/2015, 10:21 AM Durenda Hurt. Renaldo Fiddler, [facility] Acute Rehab Services Pager [phone]

## 2015-03-22 NOTE — Progress Notes (Signed)
Patient Name: Rachel Baker Date of Encounter: 03/22/2015  Active Problems:   CAP (community acquired pneumonia)   Elevated troponin   Acute diastolic heart failure (HCC)   Lobar pneumonia (HCC)   Morbid obesity with BMI of 40.0-44.9, adult (HCC)   Acute respiratory failure with hypoxia (HCC)   SOB (shortness of breath)   HOCM (hypertrophic obstructive cardiomyopathy) (HCC)   Length of Stay: 7  SUBJECTIVE  Intubated, sedated. Oxygenating fairly well. Calm. Beta blockers and diuretics stopped. Relatively low BP. Antibiotics broadened. Microbiological diagnosis is still elusive.  CURRENT MEDS . antiseptic oral rinse  7 mL Mouth Rinse QID  . aspirin  81 mg Per Tube Daily  . azithromycin  500 mg Oral Daily   Followed by  . [START ON 03/23/2015] azithromycin  250 mg Oral Daily  . ceFEPime (MAXIPIME) IV  2 g Intravenous Q8H  . chlorhexidine gluconate  15 mL Mouth Rinse BID  . enoxaparin (LOVENOX) injection  40 mg Subcutaneous Q24H  . feeding supplement (PRO-STAT SUGAR FREE 64)  30 mL Per Tube BID  . fentaNYL (SUBLIMAZE) injection  50 mcg Intravenous Once  . pantoprazole (PROTONIX) IV  40 mg Intravenous Daily    OBJECTIVE   Intake/Output Summary (Last 24 hours) at 03/22/15 1322 Last data filed at 03/22/15 0700  Gross per 24 hour  Intake 1805.99 ml  Output    775 ml  Net 1030.99 ml   Filed Weights   03/20/15 1822 03/21/15 0453 03/22/15 0300  Weight: 104.8 kg (231 lb 0.7 oz) 106.3 kg (234 lb 5.6 oz) 106.4 kg (234 lb 9.1 oz)    PHYSICAL EXAM Filed Vitals:   03/22/15 0600 03/22/15 0700 03/22/15 0731 03/22/15 1110  BP: 117/51 110/52 110/52 108/51  Pulse: 86 85 88 84  Temp: 100.9 F (38.3 C) 100.8 F (38.2 C)    TempSrc:      Resp: 19 21 25 31   Height:      Weight:      SpO2: 95% 95% 96% 93%   General: Intubated, sedated Head: no evidence of trauma, PERRL, EOMI, no exophtalmos or lid lag, no myxedema, no xanthelasma; normal ears, nose and oropharynx Neck:  normal jugular venous pulsations and no hepatojugular reflux; brisk carotid pulses without delay and no carotid bruits Chest: clear to auscultation, no signs of consolidation by percussion or palpation, normal fremitus, symmetrical and full respiratory excursions Cardiovascular: normal position and quality of the apical impulse, regular rhythm, normal first and second heart sounds, no rubs or gallops, 2/6 aortic ejection murmur and 2/6 apical holosystolic murmur murmur Abdomen: no tenderness or distention, no masses by palpation, no abnormal pulsatility or arterial bruits, normal bowel sounds, no hepatosplenomegaly Extremities: no clubbing, cyanosis or edema; 2+ radial, ulnar and brachial pulses bilaterally; 2+ right femoral, posterior tibial and dorsalis pedis pulses; 2+ left femoral, posterior tibial and dorsalis pedis pulses; no subclavian or femoral bruits Neurological: grossly nonfocal  LABS  CBC  Recent Labs  03/20/15 2000 03/21/15 0400 03/22/15 0330  WBC 20.1* 15.8* 14.5*  NEUTROABS 17.5*  --   --   HGB 12.9 12.2 11.9*  HCT 39.7 38.3 37.7  MCV 90.2 90.5 91.7  PLT 586* 515* 492*   Basic Metabolic Panel  Recent Labs  03/21/15 0400 03/22/15 0330  NA 136 138  K 4.0 4.1  CL 98* 101  CO2 24 27  GLUCOSE 110* 113*  BUN 27* 25*  CREATININE 1.22* 0.71  CALCIUM 8.9 9.0  MG 2.0 2.2  PHOS  7.3* 4.1   Fasting Lipid Panel  Recent Labs  03/20/15 2000  TRIG 148   Thyroid Function Tests  Recent Labs  03/20/15 2030  TSH 2.691    Radiology Studies Imaging results have been reviewed and Dg Chest Port 1 View  03/22/2015  CLINICAL DATA:  Intubated EXAM: PORTABLE CHEST 1 VIEW COMPARISON:  03/21/2015 FINDINGS: Cardiomegaly again noted. Stable endotracheal and NG tube position. Persistent patchy airspace disease/pneumonia bilaterally. Slight improvement in aeration right midlung. Question small left pleural effusion. Central mild vascular congestion without convincing pulmonary  edema. Stable left IJ central line with tip in SVC. IMPRESSION: Stable support apparatus. Persistent patchy airspace disease/pneumonia bilaterally. Slight improvement in aeration right midlung. Question small left pleural effusion. Central mild vascular congestion without convincing pulmonary edema. Electronically Signed   By: Natasha Mead M.D.   On: 03/22/2015 08:13   Portable Chest Xray  03/21/2015  CLINICAL DATA:  Acute on chronic respiratory failure, community-acquired pneumonia, acute CHF, known aortic and mitral stenosis EXAM: PORTABLE CHEST 1 VIEW COMPARISON:  Portable chest x-ray of March 20, 2015 FINDINGS: The lungs are well-expanded. The left hemidiaphragm is slightly better demonstrated today. Patchy confluent interstitial and alveolar opacities persist bilaterally. The cardiac silhouette is enlarged. The pulmonary vascularity is mildly prominent centrally but stable. The endotracheal tube tip lies 3 cm above the carina. The esophagogastric tube tip projects below the inferior margin of the image. The left internal jugular venous catheter tip projects over the midportion of the SVC. IMPRESSION: Persistent bilateral pneumonia. Superimposed low-grade CHF. Slight interval improvement since yesterday's study. The support tubes are in reasonable position. Electronically Signed   By: David  [place] M.D.   On: 03/21/2015 07:44   Dg Chest Port 1 View  03/20/2015  CLINICAL DATA:  Encounter for intubation, bronchoscopy and central line placement. EXAM: PORTABLE CHEST 1 VIEW COMPARISON:  03/20/2015 at 7:20 a.m. FINDINGS: Endotracheal tube tip projects 3.4 cm about the carina. Nasal/ orogastric tube passes below the diaphragm well into the stomach below the included field of view. Left internal jugular central venous line tip projects in the lower superior vena cava. No pneumothorax. Bilateral lung opacities are without significant change from the earlier study allowing differences in positioning and technique.  No new lung abnormalities. IMPRESSION: 1. Endotracheal tube, nasal/orogastric tube and left internal jugular central venous line are well positioned. 2. No pneumothorax. Electronically Signed   By: Amie Portland M.D.   On: 03/20/2015 19:32    TELE NSR   ASSESSMENT AND PLAN   1. Acute diastolic heart failure: Despite good diuresis, CXR remains severely abnormal. Exam for volume status is impeded by obesity. May need right heart cath at some point.  2. HOCM - while there are clear degenerative valvular changes, there is clearly mostly subvalvular obstruction by TEE. She has HOCM. If she develops hypotension she should be treated with a pure vasoconstrictor (phenylephrine). She should not be given positive inotropes (dopamine, norepinephrine) as these will worsen the situation. I would restart the beta blocker as soon as possible.  3. MS - mild, but also should have better hemodynamics with beta blocker  4. Multilobar pneumonia versus pulmonary edema due to CHF. I am still worried that there is a component of CHF responsible for her lung problems. It may be very challenging to distinguish its relative importance without invasive measurements.  5. Cyanotic 3rd right finger - embolic lesion? Suspicion for endocarditis appeared low in the absence of fever and other constitutional symptoms, but now sicker  and WBC up.  - Could she have had paroxysmal atrial fibrillation or endocarditis? She has HOCM and moderate left atrial dilatation, but we have not seen atrial fibrillation at all during this hospitalization.  6. Possible infectious endocarditis versus intracardiac mass - TEE shows a very unusual hyperechoic nodule attached to the posterior mitral valve apparatus on the ventricular side. Could this be the cardioembolic source? It looks very unusual for a vegetation. It could be an atypical myxoma or an unusually large papillary fibroelastoma. Assessment by echo (especially transthoracic echo) is very  difficult due to heavy acoustic shadowing from a severely calcified mitral annulus. MRI may offer some diagnostic hints, but she is not able to have such a study now. - HACEK serologies submitted. Cultures negative.   Thurmon Fair, MD, [facility] [phone] office [phone] pager 03/22/2015 1:22 PM

## 2015-03-22 NOTE — Progress Notes (Signed)
INFECTIOUS DISEASE PROGRESS NOTE  ID: Rachel Baker is a 65 y.o. female with  Active Problems:   CAP (community acquired pneumonia)   Elevated troponin   Acute diastolic heart failure (HCC)   Lobar pneumonia (HCC)   Morbid obesity with BMI of 40.0-44.9, adult (HCC)   Acute respiratory failure with hypoxia (HCC)   SOB (shortness of breath)   HOCM (hypertrophic obstructive cardiomyopathy) (HCC)  Subjective: Fever today  Abtx:  Anti-infectives    Start     Dose/Rate Route Frequency Ordered Stop   03/18/15 1400  levofloxacin (LEVAQUIN) tablet 750 mg     750 mg Oral Daily 03/18/15 1131 03/19/15 1500   03/16/15 1500  levofloxacin (LEVAQUIN) IVPB 750 mg  Status:  Discontinued     750 mg 100 mL/hr over 90 Minutes Intravenous Every 24 hours 03/15/15 1657 03/18/15 1131   03/15/15 1345  levofloxacin (LEVAQUIN) IVPB 750 mg     750 mg 100 mL/hr over 90 Minutes Intravenous  Once 03/15/15 1330 03/15/15 1527      Medications:  Scheduled: . antiseptic oral rinse  7 mL Mouth Rinse QID  . aspirin  81 mg Per Tube Daily  . chlorhexidine gluconate  15 mL Mouth Rinse BID  . enoxaparin (LOVENOX) injection  40 mg Subcutaneous Q24H  . feeding supplement (PRO-STAT SUGAR FREE 64)  30 mL Per Tube BID  . fentaNYL (SUBLIMAZE) injection  50 mcg Intravenous Once  . pantoprazole (PROTONIX) IV  40 mg Intravenous Daily    Objective: Vital signs in last 24 hours: Temp:  [98.8 F (37.1 C)-101.1 F (38.4 C)] 100.8 F (38.2 C) (02/17 0700) Pulse Rate:  [71-88] 88 (02/17 0731) Resp:  [16-28] 25 (02/17 0731) BP: (98-140)/(44-79) 110/52 mmHg (02/17 0731) SpO2:  [94 %-100 %] 96 % (02/17 0731) FiO2 (%):  [40 %-50 %] 40 % (02/17 0731) Weight:  [106.4 kg (234 lb 9.1 oz)] 106.4 kg (234 lb 9.1 oz) (02/17 0300)   General appearance: alert, cooperative and no distress Resp: rhonchi bilaterally Cardio: regular rate and rhythm GI: normal findings: bowel sounds normal and soft, non-tender Extremities:  edema none LE  Lab Results  Recent Labs  03/21/15 0400 03/22/15 0330  WBC 15.8* 14.5*  HGB 12.2 11.9*  HCT 38.3 37.7  NA 136 138  K 4.0 4.1  CL 98* 101  CO2 24 27  BUN 27* 25*  CREATININE 1.22* 0.71   Liver Panel No results for input(s): PROT, ALBUMIN, AST, ALT, ALKPHOS, BILITOT, BILIDIR, IBILI in the last 72 hours. Sedimentation Rate  Recent Labs  03/20/15 2027  ESRSEDRATE 96*   C-Reactive Protein  Recent Labs  03/20/15 2027  CRP 24.1*    Microbiology: Recent Results (from the past 240 hour(s))  Culture, blood (routine x 2) Call MD if unable to obtain prior to antibiotics being given     Status: None   Collection Time: 03/15/15  8:29 PM  Result Value Ref Range Status   Specimen Description BLOOD LEFT ANTECUBITAL  Final   Special Requests IN PEDIATRIC BOTTLE 4CC  Final   Culture NO GROWTH 5 DAYS  Final   Report Status 03/20/2015 FINAL  Final  Culture, blood (routine x 2) Call MD if unable to obtain prior to antibiotics being given     Status: None   Collection Time: 03/15/15  8:34 PM  Result Value Ref Range Status   Specimen Description BLOOD RIGHT HAND  Final   Special Requests BOTTLES DRAWN AEROBIC ONLY 5CC  Final  Culture NO GROWTH 5 DAYS  Final   Report Status 03/20/2015 FINAL  Final  Respiratory virus panel     Status: None   Collection Time: 03/16/15 12:30 PM  Result Value Ref Range Status   Respiratory Syncytial Virus A Negative Negative Final   Respiratory Syncytial Virus B Negative Negative Final   Influenza A Negative Negative Final   Influenza B Negative Negative Final   Parainfluenza 1 Negative Negative Final   Parainfluenza 2 Negative Negative Final   Parainfluenza 3 Negative Negative Final   Metapneumovirus Negative Negative Final   Rhinovirus Negative Negative Final   Adenovirus Negative Negative Final    Comment: (NOTE) Performed At: [facility] [address] [place], [place] [id number] Mila Homer MD PF:[id number]     Culture, bal-quantitative     Status: None (Preliminary result)   Collection Time: 03/20/15  6:43 PM  Result Value Ref Range Status   Specimen Description BRONCHIAL ALVEOLAR LAVAGE  Final   Special Requests LLL  Final   Gram Stain   Final    RARE WBC PRESENT, PREDOMINANTLY PMN NO SQUAMOUS EPITHELIAL CELLS SEEN NO ORGANISMS SEEN Performed at [facility]    Culture PENDING  Incomplete   Report Status PENDING  Incomplete  Fungus Culture with Smear     Status: None (Preliminary result)   Collection Time: 03/20/15  6:43 PM  Result Value Ref Range Status   Specimen Description BRONCHIAL ALVEOLAR LAVAGE  Final   Special Requests LLL  Final   Fungal Smear   Final    NO YEAST OR FUNGAL ELEMENTS SEEN Performed at [facility]    Culture   Final    CULTURE IN PROGRESS FOR FOUR WEEKS Performed at [facility]    Report Status PENDING  Incomplete  MRSA PCR Screening     Status: None   Collection Time: 03/20/15  6:43 PM  Result Value Ref Range Status   MRSA by PCR NEGATIVE NEGATIVE Final    Comment:        The GeneXpert MRSA Assay (FDA approved for NASAL specimens only), is one component of a comprehensive MRSA colonization surveillance program. It is not intended to diagnose MRSA infection nor to guide or monitor treatment for MRSA infections.     Studies/Results: Dg Chest Port 1 View  03/22/2015  CLINICAL DATA:  Intubated EXAM: PORTABLE CHEST 1 VIEW COMPARISON:  03/21/2015 FINDINGS: Cardiomegaly again noted. Stable endotracheal and NG tube position. Persistent patchy airspace disease/pneumonia bilaterally. Slight improvement in aeration right midlung. Question small left pleural effusion. Central mild vascular congestion without convincing pulmonary edema. Stable left IJ central line with tip in SVC. IMPRESSION: Stable support apparatus. Persistent patchy airspace disease/pneumonia bilaterally. Slight improvement in aeration right midlung. Question small  left pleural effusion. Central mild vascular congestion without convincing pulmonary edema. Electronically Signed   By: Natasha Mead M.D.   On: 03/22/2015 08:13   Portable Chest Xray  03/21/2015  CLINICAL DATA:  Acute on chronic respiratory failure, community-acquired pneumonia, acute CHF, known aortic and mitral stenosis EXAM: PORTABLE CHEST 1 VIEW COMPARISON:  Portable chest x-ray of March 20, 2015 FINDINGS: The lungs are well-expanded. The left hemidiaphragm is slightly better demonstrated today. Patchy confluent interstitial and alveolar opacities persist bilaterally. The cardiac silhouette is enlarged. The pulmonary vascularity is mildly prominent centrally but stable. The endotracheal tube tip lies 3 cm above the carina. The esophagogastric tube tip projects below the inferior margin of the image. The left internal jugular  venous catheter tip projects over the midportion of the SVC. IMPRESSION: Persistent bilateral pneumonia. Superimposed low-grade CHF. Slight interval improvement since yesterday's study. The support tubes are in reasonable position. Electronically Signed   By: David  [place] M.D.   On: 03/21/2015 07:44   Dg Chest Port 1 View  03/20/2015  CLINICAL DATA:  Encounter for intubation, bronchoscopy and central line placement. EXAM: PORTABLE CHEST 1 VIEW COMPARISON:  03/20/2015 at 7:20 a.m. FINDINGS: Endotracheal tube tip projects 3.4 cm about the carina. Nasal/ orogastric tube passes below the diaphragm well into the stomach below the included field of view. Left internal jugular central venous line tip projects in the lower superior vena cava. No pneumothorax. Bilateral lung opacities are without significant change from the earlier study allowing differences in positioning and technique. No new lung abnormalities. IMPRESSION: 1. Endotracheal tube, nasal/orogastric tube and left internal jugular central venous line are well positioned. 2. No pneumothorax. Electronically Signed   By: Amie Portland M.D.   On: 03/20/2015 19:32     Assessment/Plan: Diffuse pneumonia HOCM TEE shows no IE but did show Mitral annulus calcification and a nodule.  Will check Hacek serologeis,  send new bcx ask lab to hold BCx for 2 weeks To get high res CT today Await fungal studies, CMV agree with steroids, add fungal coverage if clinically worse Would consider starting cefepime/azithro  Negative tests to date: HIV/HCV Ab/Hep B S Ag/ TSH/ ANA / ANCA/ SCL70/ resp virus panel/ legionella urine Ag/ toxo/ Crypto/ Influenza  Pending- PCP, BAL studies, mycoplasma, chlamydia Fungal serologies, CMV PCR on BAL  Total days of antibiotics: off         Johny Sax Infectious Diseases (pager) [phone] [email or web address] 03/22/2015, 10:27 AM  LOS: 7 days

## 2015-03-22 NOTE — Progress Notes (Signed)
Pharmacy Antibiotic Note  Rachel Baker is a 65 y.o. female admitted on 03/15/2015 with CAP and concern of aseptic endocarditis.  Pharmacy has been consulted for cefepime dosing. ID is also following.  -WBC= 14.5, tmax= 101.1, PCT< 0.1, CrCl ~ 80 -PCP, mycoplasma and chlamydia tests pending  Plan: -Cefepime 2gm IV q8h -Will follow renal function, cultures and clinical progress  Harland German, Pharm D 03/22/2015 1:18 PM   Height: 5\' 2"  (157.5 cm) Weight: 234 lb 9.1 oz (106.4 kg) IBW/kg (Calculated) : 50.1  Temp (24hrs), Avg:100.3 F (37.9 C), Min:99 F (37.2 C), Max:101.1 F (38.4 C)   Recent Labs Lab 03/15/15 1539  03/16/15 1158 03/17/15 0309 03/18/15 0440 03/19/15 0804 03/20/15 0440 03/20/15 2000 03/21/15 0400 03/22/15 0330  WBC  --   --  9.7 10.2  --   --   --  20.1* 15.8* 14.5*  CREATININE  --   < > 0.73 0.78 0.71 0.71 0.90  --  1.22* 0.71  LATICACIDVEN 0.79  --   --   --   --   --   --   --   --   --   < > = values in this interval not displayed.  Estimated Creatinine Clearance: 80.4 mL/min (by C-G formula based on Cr of 0.71).    Allergies  Allergen Reactions  . Penicillins Hives and Itching    Has patient had a PCN reaction causing immediate rash, facial/tongue/throat swelling, SOB or lightheadedness with hypotension: Yes Has patient had a PCN reaction causing severe rash involving mucus membranes or skin necrosis: No Has patient had a PCN reaction that required hospitalization No Has patient had a PCN reaction occurring within the last 10 years: No If all of the above answers are "NO", then may proceed with Cephalosporin use.   . Sulfa Antibiotics Hives and Itching  . Ceclor [Cefaclor] Itching and Rash    Antimicrobials this admission: Levaquin 2/11 >> 2/16 Azith 2/17>> Cefepime 2/17>>  Dose adjustments this admission: none  Microbiology results: 2/11 RSV: neg 2/15 BAL: ngtd 2/15 Fungal cx: 2/15 MRSA PCR: neg 2/16 CMV cx/DNA: 2/16 toxo:  neg 2/16 histo: neg 2/16 Chlamydia: 2/16 Mycoplasma 2/16 Cryptococcal ag: neg 2/17: BCx x2  Thank you for allowing pharmacy to be a part of this patient's care.  3/17 03/22/2015 1:15 PM

## 2015-03-23 ENCOUNTER — Inpatient Hospital Stay (HOSPITAL_COMMUNITY): Payer: Medicare HMO

## 2015-03-23 DIAGNOSIS — R509 Fever, unspecified: Secondary | ICD-10-CM

## 2015-03-23 DIAGNOSIS — J181 Lobar pneumonia, unspecified organism: Principal | ICD-10-CM

## 2015-03-23 DIAGNOSIS — M069 Rheumatoid arthritis, unspecified: Secondary | ICD-10-CM

## 2015-03-23 LAB — BLOOD GAS, ARTERIAL
ACID-BASE EXCESS: 2.7 mmol/L — AB (ref 0.0–2.0)
Bicarbonate: 27.3 mEq/L — ABNORMAL HIGH (ref 20.0–24.0)
FIO2: 0.4
MECHVT: 400 mL
O2 Saturation: 93.5 %
PEEP/CPAP: 5 cmH2O
PO2 ART: 72.7 mmHg — AB (ref 80.0–100.0)
Patient temperature: 98.6
RATE: 10 resp/min
TCO2: 28.7 mmol/L (ref 0–100)
pCO2 arterial: 46.8 mmHg — ABNORMAL HIGH (ref 35.0–45.0)
pH, Arterial: 7.383 (ref 7.350–7.450)

## 2015-03-23 LAB — BASIC METABOLIC PANEL
ANION GAP: 7 (ref 5–15)
BUN: 34 mg/dL — ABNORMAL HIGH (ref 6–20)
CHLORIDE: 105 mmol/L (ref 101–111)
CO2: 28 mmol/L (ref 22–32)
Calcium: 8.8 mg/dL — ABNORMAL LOW (ref 8.9–10.3)
Creatinine, Ser: 0.75 mg/dL (ref 0.44–1.00)
GFR calc non Af Amer: >60 mL/min (ref >60)
Glucose, Bld: 103 mg/dL — ABNORMAL HIGH (ref 65–99)
POTASSIUM: 4.3 mmol/L (ref 3.5–5.1)
SODIUM: 140 mmol/L (ref 135–145)

## 2015-03-23 LAB — CULTURE, BAL-QUANTITATIVE: CULTURE: NO GROWTH

## 2015-03-23 LAB — GLUCOSE, CAPILLARY
GLUCOSE-CAPILLARY: 103 mg/dL — AB (ref 65–99)
GLUCOSE-CAPILLARY: 103 mg/dL — AB (ref 65–99)
GLUCOSE-CAPILLARY: 121 mg/dL — AB (ref 65–99)
GLUCOSE-CAPILLARY: 97 mg/dL (ref 65–99)
GLUCOSE-CAPILLARY: 97 mg/dL (ref 65–99)
Glucose-Capillary: 141 mg/dL — ABNORMAL HIGH (ref 65–99)

## 2015-03-23 LAB — CBC
HCT: 36 % (ref 36.0–46.0)
Hemoglobin: 11.1 g/dL — ABNORMAL LOW (ref 12.0–15.0)
MCH: 28.7 pg (ref 26.0–34.0)
MCHC: 30.8 g/dL (ref 30.0–36.0)
MCV: 93 fL (ref 78.0–100.0)
PLATELETS: 468 10*3/uL — AB (ref 150–400)
RBC: 3.87 MIL/uL (ref 3.87–5.11)
RDW: 13.8 % (ref 11.5–15.5)
WBC: 11.1 10*3/uL — AB (ref 4.0–10.5)

## 2015-03-23 LAB — ANTI-SMITH ANTIBODY: ENA SM Ab Ser-aCnc: <0.2 AI (ref 0.0–0.9)

## 2015-03-23 LAB — TRIGLYCERIDES: TRIGLYCERIDES: 176 mg/dL — AB (ref <150)

## 2015-03-23 LAB — CULTURE, BAL-QUANTITATIVE W GRAM STAIN: Colony Count: NO GROWTH

## 2015-03-23 LAB — MYCOPLASMA PNEUMONIAE ANTIBODY, IGM

## 2015-03-23 MED ORDER — METHYLPREDNISOLONE SODIUM SUCC 40 MG IJ SOLR
40.0000 mg | Freq: Three times a day (TID) | INTRAMUSCULAR | Status: DC
  Administered 2015-03-23 – 2015-03-24 (×3): 40 mg via INTRAVENOUS
  Filled 2015-03-23 (×6): qty 1

## 2015-03-23 MED ORDER — PANTOPRAZOLE SODIUM 40 MG PO PACK
40.0000 mg | PACK | Freq: Every day | ORAL | Status: DC
  Filled 2015-03-23 (×2): qty 20

## 2015-03-23 NOTE — Progress Notes (Signed)
Spoke with Dr Vassie Loll  Pt stable from a cardiology standpoint  Cardiology to see as needed over the weekend. Please call with questions.  Hillis Range MD, [facility] 03/23/2015 11:08 AM

## 2015-03-23 NOTE — Progress Notes (Addendum)
PULMONARY / CRITICAL CARE MEDICINE   Name: Rachel Baker MRN: [id number] DOB: 12/29/1950    ADMISSION DATE:  03/15/2015  CONSULTATION DATE:  03/20/2015  REFERRING MD:  Jonetta Osgood, MD  CHIEF COMPLAINT:  Shortness of breath.   HISTORY OF PRESENT ILLNESS:  Rachel Baker is a 65 y.o. female with PMH as outlined below. She initially presented with 1-2 days of a flu-like illness with progressive shortness of breath, generalized weakness, fatigue, associated with diarrhea. No fever, chills, LE edema, chest pain, nausea or vomiting. She had progressive SOB, was evaluated in the ED with CXR concerning for multifocal pneumonia, and troponin elevation. She was admitted to the hospital on 03/14/2014 and further workup was concerning for heart failure. She was found to have Mitral stenosis and Aortic stenosis. BNP was very low. She underwent diuresis with little improvement in her symptoms, and her respiratory status actually has become worse over time. Was also treated for pneumonia, but CXR continued to demonstrate no resolution. She developed a purple lesion on the tip of her right long finger with concern for embolic source. Additionally, with worsening CXR and respiratory status in spite of treatment, autoimmune workup was also performed which has been negative except for mildly elevated RF.   On 2/15 - she began to develop worsening respiratory status with increasing O2 requirement. She required transfer to the ICU, and intubation. Bronchoscopy performed as well.   SUBJECTIVE:  Febrile overnight.      VITAL SIGNS: BP 111/49 mmHg  Pulse 74  Temp(Src) 99.1 F (37.3 C) (Core (Comment))  Resp 19  Ht 5' 2"  (1.575 m)  Wt 106.3 kg (234 lb 5.6 oz)  BMI 42.85 kg/m2  SpO2 95%  HEMODYNAMICS:    VENTILATOR SETTINGS: Vent Mode:  [-] CPAP;PSV FiO2 (%):  [40 %] 40 % Set Rate:  [16 bmp] 16 bmp Vt Set:  [400 mL] 400 mL PEEP:  [5 cmH20-10 cmH20] 5 cmH20 Pressure Support:  [5 cmH20] 5  cmH20 Plateau Pressure:  [23 cmH20] 23 cmH20  INTAKE / OUTPUT: I/O last 3 completed shifts: In: 3380.2 [I.V.:1720.2; Other:20; NG/GT:1490; IV [id number]:545] Out: 6256 [LSLHT:3428]   PHYSICAL EXAMINATION: General: intubated, following commands Neuro: RASS -1, No focal findings.    HEENT: [place]/AT. PERRL, sclerae anicteric. Cardiovascular: RRR, harsh systolic murmur remains, No gallops or rubs, S1/S2.  Lungs: Respirations labored, even, Coarse sounds bilaterally Abdomen: BS x 4, soft, NT/ND.  Musculoskeletal: No gross deformities, no edema.  Skin: Intact, warm, no rashes. Noted Blue discoloration of right long finger. Splinter hemorrhages > improving.    LABS:  BMET  Recent Labs Lab 03/21/15 0400 03/22/15 0330 03/23/15 0359  NA 136 138 140  K 4.0 4.1 4.3  CL 98* 101 105  CO2 24 27 28   BUN 27* 25* 34*  CREATININE 1.22* 0.71 0.75  GLUCOSE 110* 113* 103*    Electrolytes  Recent Labs Lab 03/21/15 0400 03/22/15 0330 03/23/15 0359  CALCIUM 8.9 9.0 8.8*  MG 2.0 2.2  --   PHOS 7.3* 4.1  --     CBC  Recent Labs Lab 03/21/15 0400 03/22/15 0330 03/23/15 0359  WBC 15.8* 14.5* 11.1*  HGB 12.2 11.9* 11.1*  HCT 38.3 37.7 36.0  PLT 515* 492* 468*    Coag's No results for input(s): APTT, INR in the last 168 hours.  Sepsis Markers  Recent Labs Lab 03/16/15 1408 03/18/15 0440 03/20/15 0440  PROCALCITON <0.10 <0.10 0.11    ABG  Recent Labs Lab 03/21/15 0013  03/22/15 0245 03/23/15 0353  PHART 7.341* 7.356 7.383  PCO2ART 49.4* 49.6* 46.8*  PO2ART 159.0* 73.0* 72.7*    Liver Enzymes  Recent Labs Lab 03/16/15 1158 03/17/15 0309  AST 27 24  ALT 21 22  ALKPHOS 59 59  BILITOT 0.2* 0.4  ALBUMIN 2.3* 2.3*    Cardiac Enzymes  Recent Labs Lab 03/16/15 1158  TROPONINI 0.61*    Glucose  Recent Labs Lab 03/22/15 0004 03/22/15 0403 03/22/15 0846 03/22/15 1142 03/22/15 1611 03/22/15 1953  GLUCAP 90 91 105* 115* 101* 105*    Imaging Ct  Chest Wo Contrast  03/22/2015  CLINICAL DATA:  65 year old female with extensive history of malignancy. Evaluate for underlying neoplasm. EXAM: CT CHEST WITHOUT CONTRAST TECHNIQUE: Multidetector CT imaging of the chest was performed following the standard protocol without IV contrast. COMPARISON:  Multiple recent prior chest x-rays most recently earlier today 5:28 a.m. FINDINGS: Mediastinum: Unremarkable CT appearance of the thyroid gland. No suspicious mediastinal or hilar adenopathy. No soft tissue mediastinal mass. The thoracic esophagus is unremarkable and conveys a nasogastric tube into the stomach. Heart/Vascular: Borderline cardiomegaly. Small pericardial effusion. Caseous calcification of the mitral valve annulus. Scattered atherosclerotic calcifications along the course of the coronary arteries. Lungs/Pleura: Multifocal regions of airspace consolidation and ground-glass attenuation opacity involving the dependent portions of the right upper and bilateral lower lobes as well as the periphery of these left greater than right upper lobes and the right middle lobe. Respiratory motion artifact is present. No definite pleural effusion. Bones/Soft Tissues: No acute fracture or aggressive appearing lytic or blastic osseous lesion. Multilevel degenerative disc disease throughout the thoracic spine. Upper Abdomen: Nasogastric tube coiled in the stomach. Otherwise, the imaged abdomen is unremarkable. IMPRESSION: 1. Extensive ground-glass attenuation airspace opacity and airspace consolidation involving all lobes of both lungs but predominantly the dependent portions of the lower lobes. Differential considerations include multifocal pneumonia, aspiration, and less likely asymmetric or noncardiogenic pulmonary edema. Atypical processes including eosinophilic pneumonitis and organizing pneumonia are also considerations. 2. Borderline cardiomegaly. 3. Small pericardial effusion. 4. Coronary artery calcifications.  Electronically Signed   By: Jacqulynn Cadet M.D.   On: 03/22/2015 16:14   Dg Chest Port 1 View  03/23/2015  CLINICAL DATA:  65 year old female currently intubated EXAM: PORTABLE CHEST 1 VIEW COMPARISON:  Prior chest x-ray and CT scan of the chest 03/22/2015 FINDINGS: The endotracheal tube is 2.4 cm above the carina. A nasogastric tube is present and coiled within the stomach. Left IJ central venous catheter the tip of which overlies the mid SVC. Stable cardiac and mediastinal contours. Bilateral patchy interstitial and airspace opacities most prominent in the lower lobes. No pneumothorax. No large pleural effusion. No acute osseous abnormality. IMPRESSION: 1. The tip the endotracheal tube is 2.4 cm above the carina. 2. Left IJ approach central venous catheter with the tip overlying the mid SVC. 3. Persistent diffuse bilateral interstitial and airspace opacities most confluent in the lower lobes concerning for multifocal pneumonia versus aspiration. Electronically Signed   By: Jacqulynn Cadet M.D.   On: 03/23/2015 07:37     STUDIES:  CXR 2/15 > ?Multifocal pneumonia worsening  Echo > LV EF 65-70%, G1DD, Moderate AS, Calcified Mitral Annulus mild stenosis / mild regurg, LA mod dialation, trivial pericardial effusion.  RUE U/S > Negative TEE 2/16 > Nodules, HOCM, No endocarditis visible.  Ct chest 2/17 >> Extensive ground-glass attenuation airspace opacity and airspace consolidation involving all lobes of both lungs but predominantly the dependent portions of the lower  lobes    CULTURES: BAL Culture 2/15 >  BAL CMV 2/16 > Pneumocystis Smear DFA 2/15 > Legionella Culture 2/15 >  Fungus Culture with Smear 2/15 > no yeast or fungus seen.  Blood Culture 2/10 > NG x 5 days RVP 2/11 > Negative  ANTIBIOTICS: Levaquin 2/10 - 2/14  SIGNIFICANT EVENTS: 2/10 > Admitted concern multifocal pneumonia vs. CHF exacerbation 2/13- 14 > little response to diuresis 2/15 > Progressive respiratory failure  requiring intubation, bronchoscopy performed 2/16 > TEE not suggestive of endocarditis, has nodules on Mitral and aortic valves with possible embolization  LINES/TUBES: CVL 2/15 > Foley 2/15 > ETT 2/15 >   DISCUSSION: 66 y/o F with PMH above, initially admitted with SOB thought to be due to multifocal pneumonia vs. CHF, but no response to initial treatment and actually worsening respiratory status now requiring intubation.   ASSESSMENT / PLAN:  PULMONARY A: Acute Hypoxic Respiratory Failure  Multifocal Pneumonia vs. Pulmonary Edema vs. Autoimmune Process.  Atypical Pneumonia - ? Mycobacterium, ? Fungal  ? PE - at risk (unlikely with infiltrates) ESR 96 P:   SBTs with goal extubation STart IV solumedrol   CARDIOVASCULAR A:  Hypertension - severe range pressures post - CVL Resolved.  Mitral Stenosis Acute diastolic HF - not responding to diuresis.  Aortic Stenosis - HOCM on TEE TEE > Mitral and Aortic valve nodules P:  Lopressor Lasix holding for now, with hold cr improved Cards following ? Cardiac MRI in future   RENAL A:   Diuresis for HF as above, over diruesis likely Mild AKI  Hyperphosphatemia - resolved P:   Trending fluid balance, - 7L currently.  Lasix on hold   GASTROINTESTINAL A:   GI prophylaxis. Nutrition.  P:   SUP: Pantoprazole. NPO > tube feeds 2/16  HEMATOLOGIC A:   Stable no issues Leukocytosis - resolving.  VTE Prophylaxis. P:  SCD's / lovenox CBC in am.   INFECTIOUS A:   Multifocal pneumonia ? Infectious vs inflammatory R/o aseptic endocarditis, autoimmune related, limpman sachs?, cancer? Febrile overnight.  P:   Abx per ID ID on board > Toxo immunoglobulin negative, cryptococcal ag negative.  CMV culture pending Histo ag pending CMV pcr pending Fungal Ab pending Mycoplasma ab pending Chlamydia panel pending Legionella cx pending Pneumocystis smear pending Follow cultures as above. Follow Micro Pending BAL  cultures PCT negative so far.  Leukocytosis likely demargination.  - infectious workup has been mostly negative.   AUtoimmune A: r/o autoimmune process, doubt eosinophilc pna Dsdna neg, RA pos, CCP pos - favor RA  Start solumedrol 40 q 12h  ENDOCRINE A:   Stable, No issues  TSH > normal.  P:   SSI as needed   NEUROLOGIC A:   Stable, no issues Sedated on Vent P:   Sedation:  Propofol fent Daily wua RASS goal: 0 Daily WUA.   Family updated: No family today.   Interdisciplinary Family Meeting v Palliative Care Meeting: NA  Summary - Favor rheumatoid arthritis as a cause of BL infx & possibly vasculiltis Will start steroids & hope toe xtubate soon  The patient is critically ill with multiple organ systems failure and requires high complexity decision making for assessment and support, frequent evaluation and titration of therapies, application of advanced monitoring technologies and extensive interpretation of multiple databases. Critical Care Time devoted to patient care services described in this note independent of APP time is 35 minutes.    Kara Mead MD. [facility] Flood. [facility] Pager [phone] If no response call [phone]     03/23/2015 9:49 AM

## 2015-03-23 NOTE — Progress Notes (Signed)
INFECTIOUS DISEASE PROGRESS NOTE  ID: Rachel Baker is a 65 y.o. female with  Active Problems:   CAP (community acquired pneumonia)   Elevated troponin   Acute diastolic heart failure (HCC)   Lobar pneumonia (HCC)   Morbid obesity with BMI of 40.0-44.9, adult (HCC)   Acute respiratory failure with hypoxia (HCC)   SOB (shortness of breath)   HOCM (hypertrophic obstructive cardiomyopathy) (HCC)   Mitral valve mass   Cardiac mass  Subjective: Resting quietly, awakens easily.   Abtx:  Anti-infectives    Start     Dose/Rate Route Frequency Ordered Stop   03/23/15 1000  azithromycin (ZITHROMAX) tablet 250 mg     250 mg Oral Daily 03/22/15 1057 03/27/15 0959   03/22/15 1200  ceFEPIme (MAXIPIME) 2 g in dextrose 5 % 50 mL IVPB     2 g 100 mL/hr over 30 Minutes Intravenous Every 8 hours 03/22/15 1111     03/22/15 1100  azithromycin (ZITHROMAX) tablet 500 mg     500 mg Oral Daily 03/22/15 1057 03/22/15 1353   03/18/15 1400  levofloxacin (LEVAQUIN) tablet 750 mg     750 mg Oral Daily 03/18/15 1131 03/19/15 1500   03/16/15 1500  levofloxacin (LEVAQUIN) IVPB 750 mg  Status:  Discontinued     750 mg 100 mL/hr over 90 Minutes Intravenous Every 24 hours 03/15/15 1657 03/18/15 1131   03/15/15 1345  levofloxacin (LEVAQUIN) IVPB 750 mg     750 mg 100 mL/hr over 90 Minutes Intravenous  Once 03/15/15 1330 03/15/15 1527      Medications:  Scheduled: . antiseptic oral rinse  7 mL Mouth Rinse QID  . aspirin  81 mg Per Tube Daily  . azithromycin  250 mg Oral Daily  . ceFEPime (MAXIPIME) IV  2 g Intravenous Q8H  . chlorhexidine gluconate  15 mL Mouth Rinse BID  . enoxaparin (LOVENOX) injection  40 mg Subcutaneous Q24H  . feeding supplement (PRO-STAT SUGAR FREE 64)  30 mL Per Tube BID  . fentaNYL (SUBLIMAZE) injection  50 mcg Intravenous Once  . methylPREDNISolone (SOLU-MEDROL) injection  40 mg Intravenous 3 times per day  . pantoprazole sodium  40 mg Per Tube Q1200     Objective: Vital signs in last 24 hours: Temp:  [98.8 F (37.1 C)-101.5 F (38.6 C)] 99 F (37.2 C) (02/18 1100) Pulse Rate:  [74-89] 77 (02/18 1100) Resp:  [16-30] 23 (02/18 1100) BP: (83-125)/(45-64) 105/49 mmHg (02/18 1100) SpO2:  [92 %-100 %] 95 % (02/18 1100) FiO2 (%):  [40 %] 40 % (02/18 1033) Weight:  [106.3 kg (234 lb 5.6 oz)] 106.3 kg (234 lb 5.6 oz) (02/18 0500)   General appearance: alert and no distress Resp: rhonchi bilaterally Cardio: regular rate and rhythm GI: normal findings: bowel sounds normal and soft, non-tender Extremities: edema none LE  Lab Results  Recent Labs  03/22/15 0330 03/23/15 0359  WBC 14.5* 11.1*  HGB 11.9* 11.1*  HCT 37.7 36.0  NA 138 140  K 4.1 4.3  CL 101 105  CO2 27 28  BUN 25* 34*  CREATININE 0.71 0.75   Liver Panel No results for input(s): PROT, ALBUMIN, AST, ALT, ALKPHOS, BILITOT, BILIDIR, IBILI in the last 72 hours. Sedimentation Rate  Recent Labs  03/20/15 2027  ESRSEDRATE 96*   C-Reactive Protein  Recent Labs  03/20/15 2027  CRP 24.1*    Microbiology: Recent Results (from the past 240 hour(s))  Culture, blood (routine x 2) Call MD if unable  to obtain prior to antibiotics being given     Status: None   Collection Time: 03/15/15  8:29 PM  Result Value Ref Range Status   Specimen Description BLOOD LEFT ANTECUBITAL  Final   Special Requests IN PEDIATRIC BOTTLE 4CC  Final   Culture NO GROWTH 5 DAYS  Final   Report Status 03/20/2015 FINAL  Final  Culture, blood (routine x 2) Call MD if unable to obtain prior to antibiotics being given     Status: None   Collection Time: 03/15/15  8:34 PM  Result Value Ref Range Status   Specimen Description BLOOD RIGHT HAND  Final   Special Requests BOTTLES DRAWN AEROBIC ONLY 5CC  Final   Culture NO GROWTH 5 DAYS  Final   Report Status 03/20/2015 FINAL  Final  Respiratory virus panel     Status: None   Collection Time: 03/16/15 12:30 PM  Result Value Ref Range Status    Respiratory Syncytial Virus A Negative Negative Final   Respiratory Syncytial Virus B Negative Negative Final   Influenza A Negative Negative Final   Influenza B Negative Negative Final   Parainfluenza 1 Negative Negative Final   Parainfluenza 2 Negative Negative Final   Parainfluenza 3 Negative Negative Final   Metapneumovirus Negative Negative Final   Rhinovirus Negative Negative Final   Adenovirus Negative Negative Final    Comment: (NOTE) Performed At: [facility] [address] [place], [place] [id number] Mila Homer MD NL:[id number]   Culture, bal-quantitative     Status: None (Preliminary result)   Collection Time: 03/20/15  6:43 PM  Result Value Ref Range Status   Specimen Description BRONCHIAL ALVEOLAR LAVAGE  Final   Special Requests LLL  Final   Gram Stain   Final    RARE WBC PRESENT, PREDOMINANTLY PMN NO SQUAMOUS EPITHELIAL CELLS SEEN NO ORGANISMS SEEN Performed at [facility]    Colony Count NO GROWTH Performed at [facility]   Final   Culture   Final    NO GROWTH 1 DAY Performed at [facility]    Report Status PENDING  Incomplete  Fungus Culture with Smear     Status: None (Preliminary result)   Collection Time: 03/20/15  6:43 PM  Result Value Ref Range Status   Specimen Description BRONCHIAL ALVEOLAR LAVAGE  Final   Special Requests LLL  Final   Fungal Smear   Final    NO YEAST OR FUNGAL ELEMENTS SEEN Performed at [facility]    Culture   Final    CULTURE IN PROGRESS FOR FOUR WEEKS Performed at [facility]    Report Status PENDING  Incomplete  MRSA PCR Screening     Status: None   Collection Time: 03/20/15  6:43 PM  Result Value Ref Range Status   MRSA by PCR NEGATIVE NEGATIVE Final    Comment:        The GeneXpert MRSA Assay (FDA approved for NASAL specimens only), is one component of a comprehensive MRSA colonization surveillance program. It is not intended to diagnose MRSA infection  nor to guide or monitor treatment for MRSA infections.   Culture, blood (Routine X 2) w Reflex to ID Panel     Status: None (Preliminary result)   Collection Time: 03/22/15 12:44 PM  Result Value Ref Range Status   Specimen Description BLOOD RIGHT HAND  Final   Special Requests BOTTLES DRAWN AEROBIC AND ANAEROBIC 10CC  Final   Culture PENDING  Incomplete  Report Status PENDING  Incomplete    Studies/Results: Ct Chest Wo Contrast  03/22/2015  CLINICAL DATA:  65 year old female with extensive history of malignancy. Evaluate for underlying neoplasm. EXAM: CT CHEST WITHOUT CONTRAST TECHNIQUE: Multidetector CT imaging of the chest was performed following the standard protocol without IV contrast. COMPARISON:  Multiple recent prior chest x-rays most recently earlier today 5:28 a.m. FINDINGS: Mediastinum: Unremarkable CT appearance of the thyroid gland. No suspicious mediastinal or hilar adenopathy. No soft tissue mediastinal mass. The thoracic esophagus is unremarkable and conveys a nasogastric tube into the stomach. Heart/Vascular: Borderline cardiomegaly. Small pericardial effusion. Caseous calcification of the mitral valve annulus. Scattered atherosclerotic calcifications along the course of the coronary arteries. Lungs/Pleura: Multifocal regions of airspace consolidation and ground-glass attenuation opacity involving the dependent portions of the right upper and bilateral lower lobes as well as the periphery of these left greater than right upper lobes and the right middle lobe. Respiratory motion artifact is present. No definite pleural effusion. Bones/Soft Tissues: No acute fracture or aggressive appearing lytic or blastic osseous lesion. Multilevel degenerative disc disease throughout the thoracic spine. Upper Abdomen: Nasogastric tube coiled in the stomach. Otherwise, the imaged abdomen is unremarkable. IMPRESSION: 1. Extensive ground-glass attenuation airspace opacity and airspace consolidation  involving all lobes of both lungs but predominantly the dependent portions of the lower lobes. Differential considerations include multifocal pneumonia, aspiration, and less likely asymmetric or noncardiogenic pulmonary edema. Atypical processes including eosinophilic pneumonitis and organizing pneumonia are also considerations. 2. Borderline cardiomegaly. 3. Small pericardial effusion. 4. Coronary artery calcifications. Electronically Signed   By: Malachy Moan M.D.   On: 03/22/2015 16:14   Dg Chest Port 1 View  03/23/2015  CLINICAL DATA:  65 year old female currently intubated EXAM: PORTABLE CHEST 1 VIEW COMPARISON:  Prior chest x-ray and CT scan of the chest 03/22/2015 FINDINGS: The endotracheal tube is 2.4 cm above the carina. A nasogastric tube is present and coiled within the stomach. Left IJ central venous catheter the tip of which overlies the mid SVC. Stable cardiac and mediastinal contours. Bilateral patchy interstitial and airspace opacities most prominent in the lower lobes. No pneumothorax. No large pleural effusion. No acute osseous abnormality. IMPRESSION: 1. The tip the endotracheal tube is 2.4 cm above the carina. 2. Left IJ approach central venous catheter with the tip overlying the mid SVC. 3. Persistent diffuse bilateral interstitial and airspace opacities most confluent in the lower lobes concerning for multifocal pneumonia versus aspiration. Electronically Signed   By: Malachy Moan M.D.   On: 03/23/2015 07:37   Dg Chest Port 1 View  03/22/2015  CLINICAL DATA:  Intubated EXAM: PORTABLE CHEST 1 VIEW COMPARISON:  03/21/2015 FINDINGS: Cardiomegaly again noted. Stable endotracheal and NG tube position. Persistent patchy airspace disease/pneumonia bilaterally. Slight improvement in aeration right midlung. Question small left pleural effusion. Central mild vascular congestion without convincing pulmonary edema. Stable left IJ central line with tip in SVC. IMPRESSION: Stable support  apparatus. Persistent patchy airspace disease/pneumonia bilaterally. Slight improvement in aeration right midlung. Question small left pleural effusion. Central mild vascular congestion without convincing pulmonary edema. Electronically Signed   By: Natasha Mead M.D.   On: 03/22/2015 08:13     Assessment/Plan: Diffuse pneumonia HOCM TEE shows no IE but did show Mitral annulus calcification and a nodule.  Will check Hacek serologeis,  send new bcx ask lab to hold BCx for 2 weeks Await fungal studies Temps better (steroids, back on anbx?)  RA Her RF and CCP are positive Discussed with  CCM that this could be underlying issue for her lung findings and CV lesion.  Now on steroids  Negative tests to date: HIV/HCV Ab/Hep B S Ag/ TSH/ ANA / ANCA/ SCL70/ resp virus panel/ legionella urine Ag/ toxo/ Crypto/ Influenza  Pending- PCP, BAL studies, mycoplasma, chlamydia Fungal serologies, CMV PCR on BAL  Total days of antibiotics: 1 cefepime/azithro         Johny Sax Infectious Diseases (pager) [phone] [email or web address] 03/23/2015, 12:03 PM  LOS: 8 days

## 2015-03-24 ENCOUNTER — Inpatient Hospital Stay (HOSPITAL_COMMUNITY): Payer: Medicare HMO

## 2015-03-24 DIAGNOSIS — M79644 Pain in right finger(s): Secondary | ICD-10-CM

## 2015-03-24 LAB — CBC
HCT: 37.1 % (ref 36.0–46.0)
Hemoglobin: 11.3 g/dL — ABNORMAL LOW (ref 12.0–15.0)
MCH: 28.4 pg (ref 26.0–34.0)
MCHC: 30.5 g/dL (ref 30.0–36.0)
MCV: 93.2 fL (ref 78.0–100.0)
PLATELETS: 483 10*3/uL — AB (ref 150–400)
RBC: 3.98 MIL/uL (ref 3.87–5.11)
RDW: 13.7 % (ref 11.5–15.5)
WBC: 8.5 10*3/uL (ref 4.0–10.5)

## 2015-03-24 LAB — BASIC METABOLIC PANEL
Anion gap: 8 (ref 5–15)
BUN: 39 mg/dL — AB (ref 6–20)
CALCIUM: 8.9 mg/dL (ref 8.9–10.3)
CHLORIDE: 108 mmol/L (ref 101–111)
CO2: 26 mmol/L (ref 22–32)
CREATININE: 0.64 mg/dL (ref 0.44–1.00)
GFR calc non Af Amer: >60 mL/min (ref >60)
Glucose, Bld: 152 mg/dL — ABNORMAL HIGH (ref 65–99)
Potassium: 4.9 mmol/L (ref 3.5–5.1)
SODIUM: 142 mmol/L (ref 135–145)

## 2015-03-24 LAB — GLUCOSE, CAPILLARY
GLUCOSE-CAPILLARY: 149 mg/dL — AB (ref 65–99)
Glucose-Capillary: 131 mg/dL — ABNORMAL HIGH (ref 65–99)
Glucose-Capillary: 135 mg/dL — ABNORMAL HIGH (ref 65–99)
Glucose-Capillary: 120 mg/dL — ABNORMAL HIGH (ref 65–99)
Glucose-Capillary: 107 mg/dL — ABNORMAL HIGH (ref 65–99)
Glucose-Capillary: 117 mg/dL — ABNORMAL HIGH (ref 65–99)

## 2015-03-24 MED ORDER — PANTOPRAZOLE SODIUM 40 MG IV SOLR
40.0000 mg | INTRAVENOUS | Status: DC
  Administered 2015-03-24 – 2015-03-27 (×4): 40 mg via INTRAVENOUS
  Filled 2015-03-24 (×6): qty 40

## 2015-03-24 MED ORDER — DEXTROSE 5 % IV SOLN
250.0000 mg | INTRAVENOUS | Status: DC
  Filled 2015-03-24: qty 250

## 2015-03-24 MED ORDER — METHYLPREDNISOLONE SODIUM SUCC 125 MG IJ SOLR
80.0000 mg | Freq: Three times a day (TID) | INTRAMUSCULAR | Status: DC
  Administered 2015-03-24 – 2015-03-26 (×6): 80 mg via INTRAVENOUS
  Filled 2015-03-24: qty 2
  Filled 2015-03-24 (×3): qty 1.28
  Filled 2015-03-24: qty 2
  Filled 2015-03-24: qty 1.28
  Filled 2015-03-24: qty 2

## 2015-03-24 NOTE — Progress Notes (Signed)
OT Cancellation Note and Discharge  Patient Details Name: Rachel Baker MRN: [id number] DOB: 12-14-1950   Cancelled Treatment:    Reason Eval/Treat Not Completed: Patient not medically ready.Patient has had increasing respiratory failure. To ICU, intubated and sedated on 03/20/15. Will d/c OT at this time. **MD: Please re-consult Occupational Therapy when appropriate for patient. Thank you.  Evette Georges [phone] 03/24/2015, 7:59 AM

## 2015-03-24 NOTE — Progress Notes (Signed)
PULMONARY / CRITICAL CARE MEDICINE   Name: Rachel Baker MRN: [id number] DOB: 08/26/1950    ADMISSION DATE:  03/15/2015  CONSULTATION DATE:  03/20/2015  REFERRING MD:  Shanker M Ghimire, MD  CHIEF COMPLAINT:  Shortness of breath.   HISTORY OF PRESENT ILLNESS:  Rachel Baker is a 65 y.o. female with PMH as outlined below. She initially presented with 1-2 days of a flu-like illness with progressive shortness of breath, generalized weakness, fatigue, associated with diarrhea. No fever, chills, LE edema, chest pain, nausea or vomiting. She had progressive SOB, was evaluated in the ED with CXR concerning for multifocal pneumonia, and troponin elevation. She was admitted to the hospital on 03/14/2014 and further workup was concerning for heart failure. She was found to have Mitral stenosis and Aortic stenosis. BNP was very low. She underwent diuresis with little improvement in her symptoms, and her respiratory status actually has become worse over time. Was also treated for pneumonia, but CXR continued to demonstrate no resolution. She developed a purple lesion on the tip of her right long finger with concern for embolic source. Additionally, with worsening CXR and respiratory status in spite of treatment, autoimmune workup was also performed which has been negative except for mildly elevated RF.   On 2/15 - she began to develop worsening respiratory status with increasing O2 requirement. She required transfer to the ICU, and intubation. Bronchoscopy performed as well.   SUBJECTIVE:  aFebrile  Very anxious desatn during PS trial Able to write & communicate     VITAL SIGNS: BP 142/74 mmHg  Pulse 78  Temp(Src) 97 F (36.1 C) (Oral)  Resp 26  Ht 5' 2" (1.575 m)  Wt 106.8 kg (235 lb 7.2 oz)  BMI 43.05 kg/m2  SpO2 87%  HEMODYNAMICS:    VENTILATOR SETTINGS: Vent Mode:  [-] PRVC FiO2 (%):  [40 %-50 %] 50 % Set Rate:  [16 bmp] 16 bmp Vt Set:  [400 mL] 400 mL PEEP:  [5 cmH20] 5  cmH20 Pressure Support:  [5 cmH20] 5 cmH20 Plateau Pressure:  [12 cmH20-25 cmH20] 25 cmH20  INTAKE / OUTPUT: I/O last 3 completed shifts: In: 3552.1 [I.V.:1042.1; NG/GT:2210; IV Piggyback:300] Out: 2125 [Urine:2125]   PHYSICAL EXAMINATION: General: intubated, following commands Neuro: RASS +1, No focal findings.    HEENT: [place]/AT. PERRL, sclerae anicteric. Cardiovascular: RRR, harsh systolic murmur remains, No gallops or rubs, S1/S2.  Lungs: Respirations labored, even, Coarse sounds bilaterally Abdomen: BS x 4, soft, NT/ND.  Musculoskeletal: No gross deformities, no edema.  Skin: Intact, warm, no rashes. Noted Blue discoloration of right long finger. Splinter hemorrhages > improving.    LABS:  BMET  Recent Labs Lab 03/22/15 0330 03/23/15 0359 03/24/15 0500  NA 138 140 142  K 4.1 4.3 4.9  CL 101 105 108  CO2 27 28 26  BUN 25* 34* 39*  CREATININE 0.71 0.75 0.64  GLUCOSE 113* 103* 152*    Electrolytes  Recent Labs Lab 03/21/15 0400 03/22/15 0330 03/23/15 0359 03/24/15 0500  CALCIUM 8.9 9.0 8.8* 8.9  MG 2.0 2.2  --   --   PHOS 7.3* 4.1  --   --     CBC  Recent Labs Lab 03/22/15 0330 03/23/15 0359 03/24/15 0500  WBC 14.5* 11.1* 8.5  HGB 11.9* 11.1* 11.3*  HCT 37.7 36.0 37.1  PLT 492* 468* 483*    Coag's No results for input(s): APTT, INR in the last 168 hours.  Sepsis Markers  Recent Labs Lab 03/18/15 0440 03/20/15   0440  PROCALCITON <0.10 0.11    ABG  Recent Labs Lab 03/21/15 0013 03/22/15 0245 03/23/15 0353  PHART 7.341* 7.356 7.383  PCO2ART 49.4* 49.6* 46.8*  PO2ART 159.0* 73.0* 72.7*    Liver Enzymes No results for input(s): AST, ALT, ALKPHOS, BILITOT, ALBUMIN in the last 168 hours.  Cardiac Enzymes No results for input(s): TROPONINI, PROBNP in the last 168 hours.  Glucose  Recent Labs Lab 03/23/15 0832 03/23/15 1239 03/23/15 1633 03/23/15 1942 03/23/15 2328 03/24/15 0349  GLUCAP 121* 97 103* 141* 149* 131*     Imaging Dg Chest Port 1 View  03/24/2015  CLINICAL DATA:  Acute respiratory failure. EXAM: PORTABLE CHEST 1 VIEW COMPARISON:  03/23/2015. FINDINGS: Endotracheal tube in satisfactory position. Nasogastric tube extending into the stomach. Left jugular catheter tip in the superior vena cava. Stable enlarged cardiac silhouette and prominent pulmonary vasculature. Mildly decreased airspace opacity in both lower lung zones. No visible pleural fluid. Thoracic spine degenerative changes. IMPRESSION: 1. Mildly improved bibasilar pneumonia, aspiration pneumonitis or alveolar edema. 2. Stable cardiomegaly and pulmonary vascular congestion. Electronically Signed   By: Claudie Revering M.D.   On: 03/24/2015 07:51     STUDIES:  CXR 2/15 > ?Multifocal pneumonia worsening  Echo > LV EF 65-70%, G1DD, Moderate AS, Calcified Mitral Annulus mild stenosis / mild regurg, LA mod dialation, trivial pericardial effusion.  RUE U/S > Negative TEE 2/16 > Nodules, HOCM, No endocarditis visible.  Ct chest 2/17 >> Extensive ground-glass attenuation airspace opacity and airspace consolidation involving all lobes of both lungs but predominantly the dependent portions of the lower lobes    CULTURES: BAL Culture 2/15 > ng BAL CMV 2/16 > Pneumocystis Smear DFA 2/15 > Legionella Culture 2/15 >  Fungus Culture with Smear 2/15 > no yeast or fungus seen.  Blood Culture 2/10 > NG x 5 days RVP 2/11 > Negative  ANTIBIOTICS: Levaquin 2/10 - 2/14  SIGNIFICANT EVENTS: 2/10 > Admitted concern multifocal pneumonia vs. CHF exacerbation 2/13- 14 > little response to diuresis 2/15 > Progressive respiratory failure requiring intubation, bronchoscopy performed 2/16 > TEE not suggestive of endocarditis, has nodules on Mitral and aortic valves with possible embolization  LINES/TUBES: CVL 2/15 > Foley 2/15 > ETT 2/15 >   DISCUSSION: 65 y/o F with PMH above, initially admitted with SOB thought to be due to multifocal pneumonia vs.  CHF, but no response to initial treatment and actually worsening respiratory status now requiring intubation.   ASSESSMENT / PLAN:  PULMONARY A: Acute Hypoxic Respiratory Failure  Multifocal Pneumonia vs. Pulmonary Edema vs. Autoimmune Process.  Atypical Pneumonia - ? Mycobacterium, ? Fungal  ? PE - at risk (unlikely with infiltrates) ESR 96 P:   SBTs with goal extubation Started  IV solumedrol 2/19, increase to 80 q 8h   CARDIOVASCULAR A:  Hypertension - severe range pressures post - CVL Resolved.  Mitral Stenosis Acute diastolic HF - not responding to diuresis.  Aortic Stenosis - HOCM on TEE TEE > Mitral and Aortic valve nodules P:  Lopressor  Cards following ? Cardiac MRI in future   RENAL A:   Diuresis for HF as above, over diruesis likely Mild AKI  Hyperphosphatemia - resolved P:   Trending fluid balance, - 6L currently.  Lasix with hold cr improved   GASTROINTESTINAL A:   GI prophylaxis. Nutrition.  P:   SUP: Pantoprazole. NPO > tube feeds 2/16  HEMATOLOGIC A:   Stable no issues Leukocytosis - resolving.  VTE Prophylaxis. P:  SCD's /  lovenox CBC in am.   INFECTIOUS A:   Multifocal pneumonia ? Infectious vs inflammatory R/o aseptic endocarditis, autoimmune related, limpman sachs?, cancer? Febrile 2/17 P:   Abx per ID ID on board > Toxo immunoglobulin negative, cryptococcal ag negative.  CMV culture pending Histo ag pending CMV pcr pending Fungal Ab pending Mycoplasma ab neg Chlamydia panel pending Legionella cx pending Pneumocystis smear pending PCT negative so far.   - infectious workup has been mostly negative.   AUtoimmune A: r/o autoimmune process, doubt eosinophilc pna Dsdna neg, RA pos, CCP pos - favor RA  Increase solumedrol 80 q 8h  ENDOCRINE A:   Stable, No issues  TSH > normal.  P:   SSI as needed   NEUROLOGIC A:   Stable, no issues Sedated on Vent P:   Sedation:  Propofol fent Daily wua RASS goal:  0 Daily WUA.   Family updated: No family today.   Interdisciplinary Family Meeting v Palliative Care Meeting: NA  Summary - Favor rheumatoid arthritis as a cause of BL infx & possibly vasculiltis Will start steroids & hope to  extubate soon once passes SBT  The patient is critically ill with multiple organ systems failure and requires high complexity decision making for assessment and support, frequent evaluation and titration of therapies, application of advanced monitoring technologies and extensive interpretation of multiple databases. Critical Care Time devoted to patient care services described in this note independent of APP time is 35 minutes.    Kara Mead MD. Shade Flood. [place] [facility] Pager [phone] If no response call [phone]     03/24/2015 8:14 AM

## 2015-03-24 NOTE — Procedures (Signed)
Extubation Procedure Note  Patient Details:   Name: Rachel Baker DOB: [birth year] MRN: [id number]   Airway Documentation:     Evaluation  O2 sats: stable throughout Complications: No apparent complications Patient did tolerate procedure well. Bilateral Breath Sounds: Clear, Diminished Suctioning: Airway Yes   Positive cuff leak noted prior to extubation. Pt placed on [place] 5 Lpm with humidity, no stridor noted. Pt comfortable.  Rayburn Felt 03/24/2015, 9:50 AM

## 2015-03-24 NOTE — Progress Notes (Signed)
INFECTIOUS DISEASE PROGRESS NOTE  ID: Rachel Baker is a 65 y.o. female with  Active Problems:   CAP (community acquired pneumonia)   Elevated troponin   Acute diastolic heart failure (HCC)   Lobar pneumonia (HCC)   Morbid obesity with BMI of 40.0-44.9, adult (HCC)   Acute respiratory failure with hypoxia (HCC)   SOB (shortness of breath)   HOCM (hypertrophic obstructive cardiomyopathy) (HCC)   Mitral valve mass   Cardiac mass  Subjective: Feels better, off vent.  R 3rd finger still painful.   Abtx:  Anti-infectives    Start     Dose/Rate Route Frequency Ordered Stop   03/24/15 1000  azithromycin (ZITHROMAX) 250 mg in dextrose 5 % 125 mL IVPB     250 mg 125 mL/hr over 60 Minutes Intravenous Every 24 hours 03/24/15 0955     03/23/15 1000  azithromycin (ZITHROMAX) tablet 250 mg  Status:  Discontinued     250 mg Oral Daily 03/22/15 1057 03/24/15 0955   03/22/15 1200  ceFEPIme (MAXIPIME) 2 g in dextrose 5 % 50 mL IVPB     2 g 100 mL/hr over 30 Minutes Intravenous Every 8 hours 03/22/15 1111     03/22/15 1100  azithromycin (ZITHROMAX) tablet 500 mg     500 mg Oral Daily 03/22/15 1057 03/22/15 1353   03/18/15 1400  levofloxacin (LEVAQUIN) tablet 750 mg     750 mg Oral Daily 03/18/15 1131 03/19/15 1500   03/16/15 1500  levofloxacin (LEVAQUIN) IVPB 750 mg  Status:  Discontinued     750 mg 100 mL/hr over 90 Minutes Intravenous Every 24 hours 03/15/15 1657 03/18/15 1131   03/15/15 1345  levofloxacin (LEVAQUIN) IVPB 750 mg     750 mg 100 mL/hr over 90 Minutes Intravenous  Once 03/15/15 1330 03/15/15 1527      Medications:  Scheduled: . antiseptic oral rinse  7 mL Mouth Rinse QID  . aspirin  81 mg Per Tube Daily  . azithromycin  250 mg Intravenous Q24H  . ceFEPime (MAXIPIME) IV  2 g Intravenous Q8H  . chlorhexidine gluconate  15 mL Mouth Rinse BID  . enoxaparin (LOVENOX) injection  40 mg Subcutaneous Q24H  . feeding supplement (PRO-STAT SUGAR FREE 64)  30 mL Per Tube  BID  . fentaNYL (SUBLIMAZE) injection  50 mcg Intravenous Once  . methylPREDNISolone (SOLU-MEDROL) injection  80 mg Intravenous 3 times per day  . pantoprazole (PROTONIX) IV  40 mg Intravenous Q24H    Objective: Vital signs in last 24 hours: Temp:  [96.4 F (35.8 C)-99.3 F (37.4 C)] 97 F (36.1 C) (02/19 0815) Pulse Rate:  [61-82] 81 (02/19 0902) Resp:  [17-32] 18 (02/19 0902) BP: (85-157)/(44-80) 157/80 mmHg (02/19 0902) SpO2:  [87 %-97 %] 96 % (02/19 0902) FiO2 (%):  [40 %-50 %] 50 % (02/19 0729) Weight:  [106.8 kg (235 lb 7.2 oz)] 106.8 kg (235 lb 7.2 oz) (02/19 0500)   General appearance: alert, cooperative and no distress Neck: R IJ is clean.  Resp: rhonchi bilaterally Cardio: regular rate and rhythm GI: normal findings: bowel sounds normal and soft, non-tender Extremities: edema none and R 3rd digit unchanged, tender.   Lab Results  Recent Labs  03/23/15 0359 03/24/15 0500  WBC 11.1* 8.5  HGB 11.1* 11.3*  HCT 36.0 37.1  NA 140 142  K 4.3 4.9  CL 105 108  CO2 28 26  BUN 34* 39*  CREATININE 0.75 0.64   Liver Panel No results for  input(s): PROT, ALBUMIN, AST, ALT, ALKPHOS, BILITOT, BILIDIR, IBILI in the last 72 hours. Sedimentation Rate No results for input(s): ESRSEDRATE in the last 72 hours. C-Reactive Protein No results for input(s): CRP in the last 72 hours.  Microbiology: Recent Results (from the past 240 hour(s))  Culture, blood (routine x 2) Call MD if unable to obtain prior to antibiotics being given     Status: None   Collection Time: 03/15/15  8:29 PM  Result Value Ref Range Status   Specimen Description BLOOD LEFT ANTECUBITAL  Final   Special Requests IN PEDIATRIC BOTTLE 4CC  Final   Culture NO GROWTH 5 DAYS  Final   Report Status 03/20/2015 FINAL  Final  Culture, blood (routine x 2) Call MD if unable to obtain prior to antibiotics being given     Status: None   Collection Time: 03/15/15  8:34 PM  Result Value Ref Range Status   Specimen  Description BLOOD RIGHT HAND  Final   Special Requests BOTTLES DRAWN AEROBIC ONLY 5CC  Final   Culture NO GROWTH 5 DAYS  Final   Report Status 03/20/2015 FINAL  Final  Respiratory virus panel     Status: None   Collection Time: 03/16/15 12:30 PM  Result Value Ref Range Status   Respiratory Syncytial Virus A Negative Negative Final   Respiratory Syncytial Virus B Negative Negative Final   Influenza A Negative Negative Final   Influenza B Negative Negative Final   Parainfluenza 1 Negative Negative Final   Parainfluenza 2 Negative Negative Final   Parainfluenza 3 Negative Negative Final   Metapneumovirus Negative Negative Final   Rhinovirus Negative Negative Final   Adenovirus Negative Negative Final    Comment: (NOTE) Performed At: [facility] [address] [place], [place] [id number] Mila Homer MD WU:[id number]   Culture, bal-quantitative     Status: None   Collection Time: 03/20/15  6:43 PM  Result Value Ref Range Status   Specimen Description BRONCHIAL ALVEOLAR LAVAGE  Final   Special Requests LLL  Final   Gram Stain   Final    RARE WBC PRESENT, PREDOMINANTLY PMN NO SQUAMOUS EPITHELIAL CELLS SEEN NO ORGANISMS SEEN Performed at [facility]    Colony Count NO GROWTH Performed at [facility]   Final   Culture   Final    NO GROWTH 2 DAYS Performed at [facility]    Report Status 03/23/2015 FINAL  Final  Fungus Culture with Smear     Status: None (Preliminary result)   Collection Time: 03/20/15  6:43 PM  Result Value Ref Range Status   Specimen Description BRONCHIAL ALVEOLAR LAVAGE  Final   Special Requests LLL  Final   Fungal Smear   Final    NO YEAST OR FUNGAL ELEMENTS SEEN Performed at [facility]    Culture   Final    CULTURE IN PROGRESS FOR FOUR WEEKS Performed at [facility]    Report Status PENDING  Incomplete  MRSA PCR Screening     Status: None   Collection Time: 03/20/15  6:43 PM  Result Value  Ref Range Status   MRSA by PCR NEGATIVE NEGATIVE Final    Comment:        The GeneXpert MRSA Assay (FDA approved for NASAL specimens only), is one component of a comprehensive MRSA colonization surveillance program. It is not intended to diagnose MRSA infection nor to guide or monitor treatment for MRSA infections.   Culture, blood (single)  Status: None (Preliminary result)   Collection Time: 03/22/15 10:33 AM  Result Value Ref Range Status   Specimen Description BLOOD RIGHT ANTECUBITAL  Final   Special Requests IN PEDIATRIC BOTTLE 4CC  Final   Culture NO GROWTH 1 DAY  Final   Report Status PENDING  Incomplete  Culture, blood (Routine X 2) w Reflex to ID Panel     Status: None (Preliminary result)   Collection Time: 03/22/15 12:44 PM  Result Value Ref Range Status   Specimen Description BLOOD RIGHT HAND  Final   Special Requests BOTTLES DRAWN AEROBIC AND ANAEROBIC 10CC  Final   Culture NO GROWTH < 24 HOURS  Final   Report Status PENDING  Incomplete    Studies/Results: Ct Chest Wo Contrast  03/22/2015  CLINICAL DATA:  65 year old female with extensive history of malignancy. Evaluate for underlying neoplasm. EXAM: CT CHEST WITHOUT CONTRAST TECHNIQUE: Multidetector CT imaging of the chest was performed following the standard protocol without IV contrast. COMPARISON:  Multiple recent prior chest x-rays most recently earlier today 5:28 a.m. FINDINGS: Mediastinum: Unremarkable CT appearance of the thyroid gland. No suspicious mediastinal or hilar adenopathy. No soft tissue mediastinal mass. The thoracic esophagus is unremarkable and conveys a nasogastric tube into the stomach. Heart/Vascular: Borderline cardiomegaly. Small pericardial effusion. Caseous calcification of the mitral valve annulus. Scattered atherosclerotic calcifications along the course of the coronary arteries. Lungs/Pleura: Multifocal regions of airspace consolidation and ground-glass attenuation opacity involving the  dependent portions of the right upper and bilateral lower lobes as well as the periphery of these left greater than right upper lobes and the right middle lobe. Respiratory motion artifact is present. No definite pleural effusion. Bones/Soft Tissues: No acute fracture or aggressive appearing lytic or blastic osseous lesion. Multilevel degenerative disc disease throughout the thoracic spine. Upper Abdomen: Nasogastric tube coiled in the stomach. Otherwise, the imaged abdomen is unremarkable. IMPRESSION: 1. Extensive ground-glass attenuation airspace opacity and airspace consolidation involving all lobes of both lungs but predominantly the dependent portions of the lower lobes. Differential considerations include multifocal pneumonia, aspiration, and less likely asymmetric or noncardiogenic pulmonary edema. Atypical processes including eosinophilic pneumonitis and organizing pneumonia are also considerations. 2. Borderline cardiomegaly. 3. Small pericardial effusion. 4. Coronary artery calcifications. Electronically Signed   By: Malachy Moan M.D.   On: 03/22/2015 16:14   Dg Chest Port 1 View  03/24/2015  CLINICAL DATA:  Acute respiratory failure. EXAM: PORTABLE CHEST 1 VIEW COMPARISON:  03/23/2015. FINDINGS: Endotracheal tube in satisfactory position. Nasogastric tube extending into the stomach. Left jugular catheter tip in the superior vena cava. Stable enlarged cardiac silhouette and prominent pulmonary vasculature. Mildly decreased airspace opacity in both lower lung zones. No visible pleural fluid. Thoracic spine degenerative changes. IMPRESSION: 1. Mildly improved bibasilar pneumonia, aspiration pneumonitis or alveolar edema. 2. Stable cardiomegaly and pulmonary vascular congestion. Electronically Signed   By: Beckie Salts M.D.   On: 03/24/2015 07:51   Dg Chest Port 1 View  03/23/2015  CLINICAL DATA:  65 year old female currently intubated EXAM: PORTABLE CHEST 1 VIEW COMPARISON:  Prior chest x-ray and  CT scan of the chest 03/22/2015 FINDINGS: The endotracheal tube is 2.4 cm above the carina. A nasogastric tube is present and coiled within the stomach. Left IJ central venous catheter the tip of which overlies the mid SVC. Stable cardiac and mediastinal contours. Bilateral patchy interstitial and airspace opacities most prominent in the lower lobes. No pneumothorax. No large pleural effusion. No acute osseous abnormality. IMPRESSION: 1. The tip the endotracheal  tube is 2.4 cm above the carina. 2. Left IJ approach central venous catheter with the tip overlying the mid SVC. 3. Persistent diffuse bilateral interstitial and airspace opacities most confluent in the lower lobes concerning for multifocal pneumonia versus aspiration. Electronically Signed   By: Malachy Moan M.D.   On: 03/23/2015 07:37     Assessment/Plan: Diffuse pneumonia HOCM TEE shows no IE but did show Mitral annulus calcification and a nodule.  Will check Hacek serologeis,  send new bcx ask lab to hold BCx for 2 weeks await CMV Will stop anbx She is afebrile, hard to know if from anbx or steroids  Negative tests to date: HIV/HCV Ab/Hep B S Ag/ TSH/ ANA / ANCA/ SCL70/ resp virus panel/ legionella urine Ag/ toxo/ Crypto/ Influenza/toxo/mycoplasma/histo/BAL Cx  Pending- PCP,  chlamydia, Fungal serologies, CMV PCR on BAL  Total days of antibiotics: 2 cefepime/azithro  Will sign off, discussed with Dr Vassie Loll and family         Johny Sax Infectious Diseases (pager) [phone] [email or web address] 03/24/2015, 10:33 AM  LOS: 9 days

## 2015-03-25 ENCOUNTER — Inpatient Hospital Stay (HOSPITAL_COMMUNITY): Payer: Medicare HMO

## 2015-03-25 DIAGNOSIS — I059 Rheumatic mitral valve disease, unspecified: Secondary | ICD-10-CM

## 2015-03-25 LAB — CBC
HEMATOCRIT: 41.9 % (ref 36.0–46.0)
HEMOGLOBIN: 13.5 g/dL (ref 12.0–15.0)
MCH: 29.9 pg (ref 26.0–34.0)
MCHC: 32.2 g/dL (ref 30.0–36.0)
MCV: 92.7 fL (ref 78.0–100.0)
Platelets: 544 10*3/uL — ABNORMAL HIGH (ref 150–400)
RBC: 4.52 MIL/uL (ref 3.87–5.11)
RDW: 13.5 % (ref 11.5–15.5)
WBC: 15 10*3/uL — AB (ref 4.0–10.5)

## 2015-03-25 LAB — GLUCOSE, CAPILLARY
GLUCOSE-CAPILLARY: 101 mg/dL — AB (ref 65–99)
GLUCOSE-CAPILLARY: 124 mg/dL — AB (ref 65–99)
GLUCOSE-CAPILLARY: 127 mg/dL — AB (ref 65–99)
Glucose-Capillary: 114 mg/dL — ABNORMAL HIGH (ref 65–99)

## 2015-03-25 LAB — BRAIN NATRIURETIC PEPTIDE: B NATRIURETIC PEPTIDE 5: 230.9 pg/mL — AB (ref 0.0–100.0)

## 2015-03-25 LAB — BASIC METABOLIC PANEL
ANION GAP: 9 (ref 5–15)
BUN: 44 mg/dL — ABNORMAL HIGH (ref 6–20)
CALCIUM: 9 mg/dL (ref 8.9–10.3)
CHLORIDE: 108 mmol/L (ref 101–111)
CO2: 26 mmol/L (ref 22–32)
Creatinine, Ser: 0.75 mg/dL (ref 0.44–1.00)
GFR calc Af Amer: >60 mL/min (ref >60)
Glucose, Bld: 136 mg/dL — ABNORMAL HIGH (ref 65–99)
POTASSIUM: 4.6 mmol/L (ref 3.5–5.1)
Sodium: 143 mmol/L (ref 135–145)

## 2015-03-25 LAB — PNEUMOCYSTIS JIROVECI SMEAR BY DFA: Pneumocystis jiroveci Ag: NEGATIVE

## 2015-03-25 LAB — CMV DNA BY PCR, QUALITATIVE: CMV DNA, Qual PCR: NEGATIVE

## 2015-03-25 MED ORDER — FUROSEMIDE 10 MG/ML IJ SOLN
40.0000 mg | Freq: Once | INTRAMUSCULAR | Status: AC
  Administered 2015-03-25: 40 mg via INTRAVENOUS
  Filled 2015-03-25: qty 4

## 2015-03-25 MED ORDER — METOPROLOL TARTRATE 1 MG/ML IV SOLN
5.0000 mg | Freq: Four times a day (QID) | INTRAVENOUS | Status: DC
  Administered 2015-03-25 – 2015-03-27 (×8): 5 mg via INTRAVENOUS
  Filled 2015-03-25 (×9): qty 5

## 2015-03-25 NOTE — Progress Notes (Signed)
PULMONARY / CRITICAL CARE MEDICINE   Name: Rachel Baker MRN: [id number] DOB: 1950/08/18    ADMISSION DATE:  03/15/2015  CONSULTATION DATE:  03/20/2015  REFERRING MD:  Jonetta Osgood, MD  CHIEF COMPLAINT:  Shortness of breath.   HISTORY OF PRESENT ILLNESS:  Rachel Baker is a 65 y.o. female with PMH as outlined below. She initially presented with 1-2 days of a flu-like illness with progressive shortness of breath, generalized weakness, fatigue, associated with diarrhea. No fever, chills, LE edema, chest pain, nausea or vomiting. She had progressive SOB, was evaluated in the ED with CXR concerning for multifocal pneumonia, and troponin elevation. She was admitted to the hospital on 03/14/2014 and further workup was concerning for heart failure. She was found to have Mitral stenosis and Aortic stenosis. BNP was very low. She underwent diuresis with little improvement in her symptoms, and her respiratory status actually has become worse over time. Was also treated for pneumonia, but CXR continued to demonstrate no resolution. She developed a purple lesion on the tip of her right long finger with concern for embolic source. Additionally, with worsening CXR and respiratory status in spite of treatment, autoimmune workup was also performed which has been negative except for mildly elevated RF.   On 2/15 - she began to develop worsening respiratory status with increasing O2 requirement. She required transfer to the ICU, and intubation. Bronchoscopy performed as well.   SUBJECTIVE:  Afebrile Remains anxious Requiring 100% nonrebreather, desats with sitting up.  Mild HTN   VITAL SIGNS: BP 174/74 mmHg  Pulse 79  Temp(Src) 97.5 F (36.4 C) (Oral)  Resp 36  Ht 5' 2"  (1.575 m)  Wt 230 lb 13.2 oz (104.7 kg)  BMI 42.21 kg/m2  SpO2 93%  HEMODYNAMICS:    VENTILATOR SETTINGS: Vent Mode:  [-]  FiO2 (%):  [55 %-100 %] 100 %  INTAKE / OUTPUT: I/O last 3 completed shifts: In: 1503.1  [I.V.:633.1; NG/GT:770; IV Piggyback:100] Out: 7989 [Urine:1810]   PHYSICAL EXAMINATION: General: extubated, nonrebreather in place Neuro: RASS 0, No focal findings.    HEENT: [place]/AT. PERRL Cardiovascular: RRR, harsh systolic murmur remains, No gallops or rubs, S1/S2.  Lungs: Respirations labored, even, Coarse sounds bilaterally Abdomen: BS x 4, soft, NT/ND.  Musculoskeletal: No gross deformities, no edema.  Skin: Intact, warm, no rashes. Noted Blue discoloration of right long finger. Splinter hemorrhages.   LABS:  BMET  Recent Labs Lab 03/23/15 0359 03/24/15 0500 03/25/15 0457  NA 140 142 143  K 4.3 4.9 4.6  CL 105 108 108  CO2 28 26 26   BUN 34* 39* 44*  CREATININE 0.75 0.64 0.75  GLUCOSE 103* 152* 136*    Electrolytes  Recent Labs Lab 03/21/15 0400 03/22/15 0330 03/23/15 0359 03/24/15 0500 03/25/15 0457  CALCIUM 8.9 9.0 8.8* 8.9 9.0  MG 2.0 2.2  --   --   --   PHOS 7.3* 4.1  --   --   --     CBC  Recent Labs Lab 03/23/15 0359 03/24/15 0500 03/25/15 0457  WBC 11.1* 8.5 15.0*  HGB 11.1* 11.3* 13.5  HCT 36.0 37.1 41.9  PLT 468* 483* 544*    Coag's No results for input(s): APTT, INR in the last 168 hours.  Sepsis Markers  Recent Labs Lab 03/20/15 0440  PROCALCITON 0.11    ABG  Recent Labs Lab 03/21/15 0013 03/22/15 0245 03/23/15 0353  PHART 7.341* 7.356 7.383  PCO2ART 49.4* 49.6* 46.8*  PO2ART 159.0* 73.0* 72.7*  Liver Enzymes No results for input(s): AST, ALT, ALKPHOS, BILITOT, ALBUMIN in the last 168 hours.  Cardiac Enzymes No results for input(s): TROPONINI, PROBNP in the last 168 hours.  Glucose  Recent Labs Lab 03/24/15 1252 03/24/15 1740 03/24/15 1921 03/24/15 2343 03/25/15 0335 03/25/15 0829  GLUCAP 120* 107* 117* 127* 101* 124*    Imaging Dg Chest Port 1 View  03/25/2015  CLINICAL DATA:  65 year old female with acute respiratory failure. EXAM: PORTABLE CHEST 1 VIEW COMPARISON:  Chest radiograph dated  03/24/2015 FINDINGS: There has been interval removal of the endotracheal and enteric tube. A left IJ central venous line with tip overlying the central SVC again noted. There is stable cardiac silhouette. Bilateral lower lung field airspace opacities appear grossly stable or slightly improved compared to prior study. A small left pleural effusion may be present. No acute osseous pathology. IMPRESSION: Interval removal of the endotracheal and enteric tube. Grossly stable or minimally improved bilateral lower lung field airspace opacities. Electronically Signed   By: Anner Crete M.D.   On: 03/25/2015 07:22     STUDIES:  CXR 2/15 > ?Multifocal pneumonia worsening  Echo > LV EF 65-70%, G1DD, Moderate AS, Calcified Mitral Annulus mild stenosis / mild regurg, LA mod dialation, trivial pericardial effusion.  RUE U/S > Negative TEE 2/16 > Nodules, HOCM, No endocarditis visible.  Ct chest 2/17 >> Extensive ground-glass attenuation airspace opacity and airspace consolidation involving all lobes of both lungs but predominantly the dependent portions of the lower lobes CXR 2/20 > unchanged  CULTURES: BAL Culture 2/15 > ng BAL CMV 2/16 > Pneumocystis Smear DFA 2/15 > Legionella Culture 2/15 >  Fungus Culture with Smear 2/15 > no yeast or fungus seen.  Blood Culture 2/10 > NG x 5 days RVP 2/11 > Negative  ANTIBIOTICS: Levaquin 2/10 - 2/14   SIGNIFICANT EVENTS: 2/10 > Admitted concern multifocal pneumonia vs. CHF exacerbation 2/13- 14 > little response to diuresis 2/15 > Progressive respiratory failure requiring intubation, bronchoscopy performed 2/16 > TEE not suggestive of endocarditis, has nodules on Mitral and aortic valves with possible embolization  LINES/TUBES: CVL 2/15 > Foley 2/15 > ETT 2/15 > 2/19  DISCUSSION: 65 y/o F with PMH above, initially admitted with SOB thought to be due to multifocal pneumonia vs. CHF, but no response to initial treatment and actually worsening  respiratory status now requiring intubation.   ASSESSMENT / PLAN:  PULMONARY A: Acute Hypoxic Respiratory Failure  Multifocal Pneumonia vs. Pulmonary Edema vs. Autoimmune Process.  Atypical Pneumonia - ? Mycobacterium, ? Fungal  ? PE - at risk (unlikely with infiltrates) ESR 96 P:   Extubated Nonrebreather as needed Reintubate if needed.  Started  IV solumedrol 2/19, 80 q 8h  CARDIOVASCULAR A:  Hypertension - severe range pressures post - CVL Resolved.  Mitral Stenosis Acute diastolic HF - not responding to diuresis.  Aortic Stenosis - HOCM on TEE TEE > Mitral and Aortic valve nodules P:  Lopressor Labetalol prn HTN.  Cards following ? Cardiac MRI in future   RENAL A:   Diuresis for HF as above, over diruesis likely Mild AKI  Hyperphosphatemia - resolved P:   Trending fluid balance, - 6.5L currently.  Holding lasix. Not overloaded to exam.    GASTROINTESTINAL A:   GI prophylaxis. Nutrition.  P:   SUP: Pantoprazole. NPO > tube feeds 2/16 Keep NPO for now When resp status improves > diet.   HEMATOLOGIC A:   Stable no issues Leukocytosis - steroids VTE Prophylaxis. P:  SCD's / lovenox CBC q am.   INFECTIOUS A:   Multifocal pneumonia ? Infectious vs inflammatory R/o aseptic endocarditis, autoimmune related, limpman sachs?, cancer? Febrile 2/17 P:   Abx per ID ID on board > Toxo immunoglobulin negative, cryptococcal ag negative.  CMV culture pending Histo ag neg.  CMV pcr pending Fungal Ab pending Mycoplasma ab neg Chlamydia panel pending Legionella cx pending Pneumocystis smear pending PCT negative so far.   - infectious workup has been mostly negative.   AUtoimmune A: r/o autoimmune process, doubt eosinophilc pna Dsdna neg, RA pos, CCP pos - favor RA  Increase solumedrol 80 q 8h  ENDOCRINE A:   Stable, No issues  TSH > normal.  P:   SSI as needed   NEUROLOGIC A:   Stable, no issues Sedated on Vent P:   Sedation:   none fent Daily wua RASS goal: 0 Daily WUA.   Family updated: No family today.   Interdisciplinary Family Meeting v Palliative Care Meeting: NA  Summary - Favor rheumatoid arthritis as a cause of BL infx & possibly vasculiltis. Tenuous respiratory status. Continuing steroids for now.   Paula Compton, MD Resident - PGY 2    03/25/2015 11:24 AM

## 2015-03-25 NOTE — Progress Notes (Signed)
SUBJECTIVE: No pain. Still with SOB.   Tele: sinus  BP 175/78 mmHg  Pulse 80  Temp(Src) 97.9 F (36.6 C) (Oral)  Resp 34  Ht 5\' 2"  (1.575 m)  Wt 230 lb 13.2 oz (104.7 kg)  BMI 42.21 kg/m2  SpO2 95%  Intake/Output Summary (Last 24 hours) at 03/25/15 1243 Last data filed at 03/25/15 1100  Gross per 24 hour  Intake    220 ml  Output   1245 ml  Net  -1025 ml    PHYSICAL EXAM General: Well developed, well nourished, in no acute distress. Alert and oriented x 3.  Psych:  Good affect, responds appropriately Neck: No JVD. No masses noted.  Lungs: Course rhonci bilaterally.   Heart: RRR with systolic murmur noted. Abdomen: Bowel sounds are present. Soft, non-tender.  Extremities: No lower extremity edema.   LABS: Basic Metabolic Panel:  Recent Labs  03/27/15 0500 03/25/15 0457  NA 142 143  K 4.9 4.6  CL 108 108  CO2 26 26  GLUCOSE 152* 136*  BUN 39* 44*  CREATININE 0.64 0.75  CALCIUM 8.9 9.0   CBC:  Recent Labs  03/24/15 0500 03/25/15 0457  WBC 8.5 15.0*  HGB 11.3* 13.5  HCT 37.1 41.9  MCV 93.2 92.7  PLT 483* 544*   Fasting Lipid Panel:  Recent Labs  03/23/15 1940  TRIG 176*    Current Meds: . antiseptic oral rinse  7 mL Mouth Rinse QID  . aspirin  81 mg Per Tube Daily  . chlorhexidine gluconate  15 mL Mouth Rinse BID  . enoxaparin (LOVENOX) injection  40 mg Subcutaneous Q24H  . fentaNYL (SUBLIMAZE) injection  50 mcg Intravenous Once  . methylPREDNISolone (SOLU-MEDROL) injection  80 mg Intravenous 3 times per day  . pantoprazole (PROTONIX) IV  40 mg Intravenous Q24H   Echo 03/16/15: Left ventricle: The cavity size was normal. Wall thickness was increased in a pattern of severe LVH. Systolic function was vigorous. The estimated ejection fraction was in the range of 65% to 70%. Wall motion was normal; there were no regional wall motion abnormalities. Doppler parameters are consistent with abnormal left ventricular relaxation  (grade 1 diastolic dysfunction). Doppler parameters are consistent with high ventricular filling pressure. - Aortic valve: Valve mobility was restricted. 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 - Mitral valve: Severely calcified annulus. The findings are consistent with mild stenosis. There was mild regurgitation. Valve area by pressure half-time: 1.96 cm^2. - Left atrium: The atrium was moderately dilated. - Pericardium, extracardiac: A trivial pericardial effusion was identified. Impressions: - Vigorous LV function; grade 1 diastolic dysfunction with elevated LV filling pressure; severe LVH; intracavitary gradient of 3.3 m/s; moderate LAE; severe MAC with mild MR and mild MS; moderate AS (mean gradient 20 mmHg).  TEE: 03/21/15: Left ventricle: Severe LVH with more significant focal basal septal hypertrophy suggestive of HOCM. There is subvalvular acceleration and outflow tract obstruction. The estimated ejection fraction was 75%. Wall motion was normal; there were no regional wall motion abnormalities. - Aortic valve: Trileaflet. There is a calcified nodule on the NCC. - Mitral valve: SAM, mild posteriorly directed regurgitation. Calcified mass on the subvalvular apparatus of the posterior leaflet. - Left atrium: The atrium was dilated. No evidence of thrombus in the atrial cavity or appendage. - Right atrium: No evidence of thrombus in the atrial cavity or appendage. - Atrial septum: No defect or patent foramen ovale was identified. - Tricuspid valve: No evidence of vegetation. - Pulmonic valve: No evidence  of vegetation. Impressions: - No endocarditis. Calcified nodules on the NCC of the aortic valve and mitral valve apparatus, with MAC. There is SAM of the anterior leaflet with mild, posteriorly directed MR. There is significant LVOT obstruction (see surface echo) with severe basal septal hypertrophy consistent with HOCM. No aortic stenosis  is identified. EF 75%.   ASSESSMENT AND PLAN:  1. Pulmonary Infiltrates, possible component of acute diastolic heart failure: Despite good diuresis, CXR remains overall unchanged, perhaps slightly better. Exam for volume status is impeded by obesity. May need right heart cath at some point to clarify but working diagnoses by pulmonary team is that this is an infectious process vs auto-immunue process. She is now on antibiotics and steroids.   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 I have personally reviewed her echo images. There are clear degenerative valvular changes but there is subvalvular obstruction by TEE. Would restart beta blocker now. Would avoid vasodilating drugs. If she develops hypotension she should be treated with a pure vasoconstrictor (phenylephrine). She should not be given positive inotropes (dopamine, norepinephrine) as these could worsen her hemodynamics.    3. Possible infectious endocarditis versus intracardiac mass: TEE shows an unusual hyperechoic nodule attached to the posterior mitral valve apparatus on the ventricular side. Possible that this could be the cardioembolic source. It looks very unusual for a vegetation. It could be an atypical myxoma or an unusually large papillary fibroelastoma. Assessment by echo is difficult due to heavy acoustic shadowing from the severely calcified mitral annulus. Would plan cardiac MRI when she is more stable.    MCALHANY,CHRISTOPHER  2/20/201712:43 PM

## 2015-03-25 NOTE — Care Management Note (Signed)
Case Management Note  Patient Details  Name: Emaline Karnes MRN: [id number] Date of Birth: [birth year]  Subjective/Objective:   Pt admitted with SOB that worsened -  Lead to intubation                 Action/Plan:  Pt is independent from home with sister and brother-in-law   Expected Discharge Date:                  Expected Discharge Plan:  Home/Self Care  In-House Referral:     Discharge planning Services  CM Consult  Post Acute Care Choice:    Choice offered to:     DME Arranged:    DME Agency:     HH Arranged:    HH Agency:     Status of Service:  In process, will continue to follow  Medicare Important Message Given:  Yes Date Medicare IM Given:    Medicare IM give by:    Date Additional Medicare IM Given:    Additional Medicare Important Message give by:     If discussed at Long Length of Stay Meetings, dates discussed:    Additional Comments: 03/25/2015 Pt is extubated -  Provided list of HH agencies in [place], pt would like to discuss it with her sister.  CM will follow up with pt Cherylann Parr, RN 03/25/2015, 2:05 PM

## 2015-03-25 NOTE — Care Management Important Message (Signed)
Important Message  Patient Details  Name: Rachel Baker MRN: [id number] Date of Birth: 07-Oct-1950   Medicare Important Message Given:  Yes    Bernadette Hoit 03/25/2015, 10:57 AM

## 2015-03-25 NOTE — Evaluation (Signed)
Physical Therapy Evaluation Patient Details Name: Rachel Baker MRN: [id number] DOB: 03/23/1950 Today's Date: 03/25/2015   History of Present Illness  pt presents with CAP and recent flu.  pt with hx of Carpal Tunnel Release and Cataract Extraction as only PMH.  On 2/15, pt began to develop worsening respiratory status with increasing O2 requirement, required transfer to the ICU, and intubation2/15-2/19. Bronchoscopy performed as well  Clinical Impression  Patient presents with functional limitations due to deficits listed in PT problem list (see below). Mobility assessment limited due to impaired respiratory status with drop in Sp02 to 82% on NRB mask at 100% sitting EOB. Pt very anxious at times requiring cues for relaxation techniques and to slow down breathing. Encouraged pt to perform LE ther ex and to get up to chair with RN once respiratory status improves. Goals updated. Will follow acutely to maximize independence and mobility prior to return home.    Follow Up Recommendations Home health PT;Supervision for mobility/OOB    Equipment Recommendations  Other (comment) (TBD)    Recommendations for Other Services OT consult     Precautions / Restrictions Precautions Precautions: Fall Precaution Comments: monitor 02; 100% nonrebreather mask Restrictions Weight Bearing Restrictions: No      Mobility  Bed Mobility   Bed Mobility: Supine to Sit;Sit to Supine     Supine to sit: Supervision Sit to supine: Supervision   General bed mobility comments: No physical assist needed but pt very anxious sitting EOB. Pt on NRB mask 100%. Anxious  Transfers                 General transfer comment: Deferred secondary to 02 sats dropping to low 80s at rest sitting EOB.  Ambulation/Gait                Stairs            Wheelchair Mobility    Modified Rankin (Stroke Patients Only)       Balance Overall balance assessment: Needs assistance Sitting-balance  support: Feet supported;No upper extremity supported Sitting balance-Leahy Scale: Good                                       Pertinent Vitals/Pain Pain Assessment: No/denies pain    Home Living Family/patient expects to be discharged to:: Private residence Living Arrangements: Other relatives (sister and brother in law) Available Help at Discharge: Family;Available PRN/intermittently Type of Home: House Home Access: Stairs to enter   [employer] of Steps: 3 or 4 Home Layout: One level Home Equipment: None      Prior Function Level of Independence: Independent               Hand Dominance        Extremity/Trunk Assessment   Upper Extremity Assessment: Defer to OT evaluation;Generalized weakness           Lower Extremity Assessment: Generalized weakness      Cervical / Trunk Assessment: Normal  Communication   Communication: No difficulties  Cognition Arousal/Alertness: Awake/alert Behavior During Therapy: Anxious Overall Cognitive Status: Within Functional Limits for tasks assessed                      General Comments General comments (skin integrity, edema, etc.): Sister present in room during session.    Exercises General Exercises - Lower Extremity Ankle Circles/Pumps: Both;20 reps;Seated Long Arc Quad:  Both;10 reps;Seated      Assessment/Plan    PT Assessment Patient needs continued PT services  PT Diagnosis Generalized weakness   PT Problem List Decreased strength;Cardiopulmonary status limiting activity;Decreased activity tolerance;Decreased balance;Decreased mobility;Decreased cognition  PT Treatment Interventions Balance training;Stair training;Functional mobility training;Therapeutic activities;Therapeutic exercise;Patient/family education;Gait training   PT Goals (Current goals can be found in the Care Plan section) Acute Rehab PT Goals Patient Stated Goal: Be able to take care of myself. PT Goal  Formulation: With patient Time For Goal Achievement: 04/08/15 Potential to Achieve Goals: Good    Frequency Min 3X/week   Barriers to discharge Decreased caregiver support;Inaccessible home environment Steps to enter home and does not have 24.7 S    Co-evaluation               End of Session Equipment Utilized During Treatment: Oxygen (NRB mask) Activity Tolerance: Treatment limited secondary to medical complications (Comment) (drop in Sp02 to 82% on NRB mask 100%) Patient left: in bed;with call bell/phone within reach;with bed alarm set;with family/visitor present Nurse Communication: Mobility status         Time: 2353-6144 PT Time Calculation (min) (ACUTE ONLY): 17 min   Charges:   PT Evaluation $PT Eval Moderate Complexity: 1 Procedure     PT G Codes:        Rachel Baker A Sherral Dirocco 03/25/2015, 12:32 PM Mylo Red, PT, DPT [phone]

## 2015-03-26 ENCOUNTER — Inpatient Hospital Stay (HOSPITAL_COMMUNITY): Payer: Medicare HMO

## 2015-03-26 DIAGNOSIS — J9601 Acute respiratory failure with hypoxia: Secondary | ICD-10-CM | POA: Insufficient documentation

## 2015-03-26 LAB — BASIC METABOLIC PANEL
ANION GAP: 8 (ref 5–15)
BUN: 54 mg/dL — ABNORMAL HIGH (ref 6–20)
CALCIUM: 9.3 mg/dL (ref 8.9–10.3)
CO2: 28 mmol/L (ref 22–32)
CREATININE: 0.84 mg/dL (ref 0.44–1.00)
Chloride: 108 mmol/L (ref 101–111)
Glucose, Bld: 119 mg/dL — ABNORMAL HIGH (ref 65–99)
Potassium: 4.6 mmol/L (ref 3.5–5.1)
Sodium: 144 mmol/L (ref 135–145)

## 2015-03-26 LAB — CBC
HCT: 43.7 % (ref 36.0–46.0)
HEMOGLOBIN: 13.6 g/dL (ref 12.0–15.0)
MCH: 28.8 pg (ref 26.0–34.0)
MCHC: 31.1 g/dL (ref 30.0–36.0)
MCV: 92.4 fL (ref 78.0–100.0)
PLATELETS: 586 10*3/uL — AB (ref 150–400)
RBC: 4.73 MIL/uL (ref 3.87–5.11)
RDW: 13.6 % (ref 11.5–15.5)
WBC: 18.5 10*3/uL — ABNORMAL HIGH (ref 4.0–10.5)

## 2015-03-26 LAB — CHLAMYDIA PANEL SERUM
C. Psittaci IgG Serum: <1:64 {titer}
C. Psittaci IgM Serum: <1:20 {titer}

## 2015-03-26 MED ORDER — FUROSEMIDE 10 MG/ML IJ SOLN
40.0000 mg | Freq: Every day | INTRAMUSCULAR | Status: DC
  Administered 2015-03-27 – 2015-03-28 (×2): 40 mg via INTRAVENOUS
  Filled 2015-03-26 (×3): qty 4

## 2015-03-26 MED ORDER — METOPROLOL TARTRATE 1 MG/ML IV SOLN: 2.5000 mg | INTRAVENOUS | Status: DC | PRN

## 2015-03-26 MED ORDER — FUROSEMIDE 10 MG/ML IJ SOLN
40.0000 mg | Freq: Two times a day (BID) | INTRAMUSCULAR | Status: DC
  Administered 2015-03-26: 40 mg via INTRAVENOUS
  Filled 2015-03-26: qty 4

## 2015-03-26 MED ORDER — ENSURE ENLIVE PO LIQD
237.0000 mL | Freq: Two times a day (BID) | ORAL | Status: DC
  Administered 2015-03-26 – 2015-04-01 (×6): 237 mL via ORAL

## 2015-03-26 MED ORDER — CETYLPYRIDINIUM CHLORIDE 0.05 % MT LIQD
7.0000 mL | Freq: Two times a day (BID) | OROMUCOSAL | Status: DC
  Administered 2015-03-26 – 2015-04-04 (×13): 7 mL via OROMUCOSAL

## 2015-03-26 MED ORDER — METHYLPREDNISOLONE SODIUM SUCC 125 MG IJ SOLR
125.0000 mg | Freq: Three times a day (TID) | INTRAMUSCULAR | Status: DC
  Administered 2015-03-26 – 2015-03-27 (×4): 125 mg via INTRAVENOUS
  Filled 2015-03-26 (×3): qty 2

## 2015-03-26 NOTE — Progress Notes (Signed)
UR Completed. [place], RN, BSN.  [phone] 

## 2015-03-26 NOTE — Progress Notes (Signed)
SUBJECTIVE: "Slept well last night". Still on facemask O2.  Tele: sinus  BP 150/65 mmHg  Pulse 64  Temp(Src) 97.7 F (36.5 C) (Oral)  Resp 20  Ht 5\' 2"  (1.575 m)  Wt 234 lb 9.1 oz (106.4 kg)  BMI 42.89 kg/m2  SpO2 94%  Intake/Output Summary (Last 24 hours) at 03/26/15 1104 Last data filed at 03/26/15 1100  Gross per 24 hour  Intake    270 ml  Output   3050 ml  Net  -2780 ml    PHYSICAL EXAM General: Well developed, well nourished, in no acute distress. Alert and oriented x 3.  Psych:  Good affect, responds appropriately Neck: No JVD. No masses noted.  Lungs: decreased breath sounds Heart: RRR with systolic murmur noted. Abdomen: Bowel sounds are present. Soft, non-tender.  Extremities: No lower extremity edema.   LABS: Basic Metabolic Panel:  Recent Labs  03/28/15 0457 03/26/15 0420  NA 143 144  K 4.6 4.6  CL 108 108  CO2 26 28  GLUCOSE 136* 119*  BUN 44* 54*  CREATININE 0.75 0.84  CALCIUM 9.0 9.3   CBC:  Recent Labs  03/25/15 0457 03/26/15 0420  WBC 15.0* 18.5*  HGB 13.5 13.6  HCT 41.9 43.7  MCV 92.7 92.4  PLT 544* 586*   Fasting Lipid Panel:  Recent Labs  03/23/15 1940  TRIG 176*    Current Meds: . antiseptic oral rinse  7 mL Mouth Rinse QID  . aspirin  81 mg Per Tube Daily  . chlorhexidine gluconate  15 mL Mouth Rinse BID  . enoxaparin (LOVENOX) injection  40 mg Subcutaneous Q24H  . fentaNYL (SUBLIMAZE) injection  50 mcg Intravenous Once  . [START ON 03/27/2015] furosemide  40 mg Intravenous Daily  . methylPREDNISolone (SOLU-MEDROL) injection  125 mg Intravenous 3 times per day  . metoprolol  5 mg Intravenous 4 times per day  . pantoprazole (PROTONIX) IV  40 mg Intravenous Q24H   Echo 03/16/15: Left ventricle: The cavity size was normal. Wall thickness was increased in a pattern of severe LVH. Systolic function was vigorous. The estimated ejection fraction was in the range of 65% to 70%. Wall motion was normal; there  were no regional wall motion abnormalities. Doppler parameters are consistent with abnormal left ventricular relaxation (grade 1 diastolic dysfunction). Doppler parameters are consistent with high ventricular filling pressure. - Aortic valve: Valve mobility was restricted. 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 - Mitral valve: Severely calcified annulus. The findings are consistent with mild stenosis. There was mild regurgitation. Valve area by pressure half-time: 1.96 cm^2. - Left atrium: The atrium was moderately dilated. - Pericardium, extracardiac: A trivial pericardial effusion was identified. Impressions: - Vigorous LV function; grade 1 diastolic dysfunction with elevated LV filling pressure; severe LVH; intracavitary gradient of 3.3 m/s; moderate LAE; severe MAC with mild MR and mild MS; moderate AS (mean gradient 20 mmHg).  TEE: 03/21/15: Left ventricle: Severe LVH with more significant focal basal septal hypertrophy suggestive of HOCM. There is subvalvular acceleration and outflow tract obstruction. The estimated ejection fraction was 75%. Wall motion was normal; there were no regional wall motion abnormalities. - Aortic valve: Trileaflet. There is a calcified nodule on the NCC. - Mitral valve: SAM, mild posteriorly directed regurgitation. Calcified mass on the subvalvular apparatus of the posterior leaflet. - Left atrium: The atrium was dilated. No evidence of thrombus in the atrial cavity or appendage. - Right atrium: No evidence of thrombus in the atrial cavity or appendage. -  Atrial septum: No defect or patent foramen ovale was identified. - Tricuspid valve: No evidence of vegetation. - Pulmonic valve: No evidence of vegetation. Impressions: - No endocarditis. Calcified nodules on the NCC of the aortic valve and mitral valve apparatus, with MAC. There is SAM of the anterior leaflet with mild, posteriorly directed MR. There  is significant LVOT obstruction (see surface echo) with severe basal septal hypertrophy consistent with HOCM. No aortic stenosis is identified. EF 75%.   ASSESSMENT AND PLAN:  1. Pulmonary Infiltrates, possible component of acute diastolic heart failure: Despite good diuresis, CXR remains overall unchanged, perhaps slightly better. Exam for volume status is impeded by obesity. May need right heart cath at some point to clarify but working diagnoses by pulmonary team is that this is an infectious process vs auto-immunue process. She is now on antibiotics and steroids. She diuresed 900 cc after Lasix 40 mg IV this am.   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 There are degenerative valvular changes but there is subvalvular obstruction by TEE. Would restart beta blocker now. Would avoid vasodilating drugs. If she develops hypotension she should be treated with a pure vasoconstrictor (phenylephrine). She should not be given positive inotropes (dopamine, norepinephrine) as these could worsen her hemodynamics.    3. Possible infectious endocarditis versus intracardiac mass: TEE shows an unusual hyperechoic nodule attached to the posterior mitral valve apparatus on the ventricular side. Possible that this could be the cardioembolic source. It looks very unusual for a vegetation. It could be an atypical myxoma or an unusually large papillary fibroelastoma. Assessment by echo is difficult due to heavy acoustic shadowing from the severely calcified mitral annulus. Would plan cardiac MRI when she is more stable.   Plan:  Family expressed frustration -"she is just not getting better". MD to see this AM but doubt she is near stable enough for cardiac MRI or Rt heart cath. See Dr Weldon Picking note from 2/20 re beta blocker therapy.   Rachel Shelter PA-C 03/26/2015 11:06 AM   I have personally seen and examined this patient with Rachel Shelter, PA-C I agree with the assessment and plan as outlined above. She  is slowly improving. No new recs today. Agree with addition of beta blocker. Cardiac MRI and right heart cath when more stable from resp standpoint. Will follow along.   Jessice Madill 03/26/2015 12:09 PM

## 2015-03-26 NOTE — Care Management Note (Addendum)
Case Management Note  Patient Details  Name: Phyllistine Domingos MRN: [id number] Date of Birth: 01-26-1951  Subjective/Objective:   Pt admitted with SOB that worsened -  Lead to intubation                 Action/Plan:  Pt is independent from home with sister Okey Regal and brother-in-law   Expected Discharge Date:                  Expected Discharge Plan:  Home/Self Care  In-House Referral:     Discharge planning Services  CM Consult  Post Acute Care Choice:    Choice offered to:     DME Arranged:    DME Agency:     HH Arranged:    HH Agency:     Status of Service:  In process, will continue to follow  Medicare Important Message Given:  Yes Date Medicare IM Given:    Medicare IM give by:    Date Additional Medicare IM Given:    Additional Medicare Important Message give by:     If discussed at Long Length of Stay Meetings, dates discussed:    Additional Comments: 03/26/2015  Pt and sister Okey Regal chose [facility] - HH/DME will be arranged once orders are written  03/25/15 Pt is extubated -  Provided list of HH agencies in [place], pt would like to discuss it with her sister.  CM will follow up with pt Cherylann Parr, RN 03/26/2015, 11:53 AM

## 2015-03-26 NOTE — Progress Notes (Signed)
PULMONARY / CRITICAL CARE MEDICINE   Name: Rachel Baker MRN: [id number] DOB: 09-06-1950    ADMISSION DATE:  03/15/2015  CONSULTATION DATE:  03/20/2015  REFERRING MD:  Jonetta Osgood, MD  CHIEF COMPLAINT:  Shortness of breath.   HISTORY OF PRESENT ILLNESS:  Rachel Baker is a 65 y.o. female with PMH as outlined below. She initially presented with 1-2 days of a flu-like illness with progressive shortness of breath, generalized weakness, fatigue, associated with diarrhea. No fever, chills, LE edema, chest pain, nausea or vomiting. She had progressive SOB, was evaluated in the ED with CXR concerning for multifocal pneumonia, and troponin elevation. She was admitted to the hospital on 03/14/2014 and further workup was concerning for heart failure. She was found to have Mitral stenosis and Aortic stenosis. BNP was very low. She underwent diuresis with little improvement in her symptoms, and her respiratory status actually has become worse over time. Was also treated for pneumonia, but CXR continued to demonstrate no resolution. She developed a purple lesion on the tip of her right long finger with concern for embolic source. Additionally, with worsening CXR and respiratory status in spite of treatment, autoimmune workup was also performed which has been negative except for mildly elevated RF.   On 2/15 - she began to develop worsening respiratory status with increasing O2 requirement. She required transfer to the ICU, and intubation. Bronchoscopy performed as well.   SUBJECTIVE:  No acute issues remains on 100% nonrebreather, but feeling better this am.     VITAL SIGNS: BP 160/65 mmHg  Pulse 72  Temp(Src) 97.5 F (36.4 C) (Oral)  Resp 26  Ht 5' 2" (1.575 m)  Wt 234 lb 9.1 oz (106.4 kg)  BMI 42.89 kg/m2  SpO2 95%  HEMODYNAMICS:    VENTILATOR SETTINGS: Vent Mode:  [-]  FiO2 (%):  [100 %] 100 %  INTAKE / OUTPUT: I/O last 3 completed shifts: In: 340 [I.V.:340] Out: 2920  [Urine:2920]   PHYSICAL EXAMINATION: General: resting comfortably in bed.  Neuro: AAOx3, conversant.    HEENT: [place]/AT. PERRL Cardiovascular: RRR, harsh systolic murmur remains, No gallops or rubs, S1/S2.  Lungs: Respirations labored, even, Coarse sounds bilaterally Abdomen: BS x 4, soft, NT/ND.  Musculoskeletal: No gross deformities, no edema.  Skin: Intact, warm, no rashes. Noted Blue discoloration of right long finger. Splinter hemorrhages.   LABS:  BMET  Recent Labs Lab 03/24/15 0500 03/25/15 0457 03/26/15 0420  NA 142 143 144  K 4.9 4.6 4.6  CL 108 108 108  CO2 _0 BUN 39* 44* 54*  CREATININE 0.64 0.75 0.84  GLUCOSE 152* 136* 119*    Electrolytes  Recent Labs Lab 03/21/15 0400 03/22/15 0330  03/24/15 0500 03/25/15 0457 03/26/15 0420  CALCIUM 8.9 9.0  < > 8.9 9.0 9.3  MG 2.0 2.2  --   --   --   --   PHOS 7.3* 4.1  --   --   --   --   < > = values in this interval not displayed.  CBC  Recent Labs Lab 03/24/15 0500 03/25/15 0457 03/26/15 0420  WBC 8.5 15.0* 18.5*  HGB 11.3* 13.5 13.6  HCT 37.1 41.9 43.7  PLT 483* 544* 586*    Coag's No results for input(s): APTT, INR in the last 168 hours.  Sepsis Markers  Recent Labs Lab 03/20/15 0440  PROCALCITON 0.11    ABG  Recent Labs Lab 03/21/15 0013 03/22/15 0245 03/23/15 0353  PHART 7.341*  7.356 7.383  PCO2ART 49.4* 49.6* 46.8*  PO2ART 159.0* 73.0* 72.7*    Liver Enzymes No results for input(s): AST, ALT, ALKPHOS, BILITOT, ALBUMIN in the last 168 hours.  Cardiac Enzymes No results for input(s): TROPONINI, PROBNP in the last 168 hours.  Glucose  Recent Labs Lab 03/24/15 1740 03/24/15 1921 03/24/15 2343 03/25/15 0335 03/25/15 0829 03/25/15 1241  GLUCAP 107* 117* 127* 101* 124* 114*    Imaging Dg Chest Port 1 View  03/26/2015  CLINICAL DATA:  Respiratory failure. EXAM: PORTABLE CHEST 1 VIEW COMPARISON:  03/25/2015. FINDINGS: Left IJ line in stable position. Cardiomegaly  with bilateral pulmonary infiltrates suggesting pulmonary edema. Bilateral pneumonia cannot be excluded. Infiltrates have progressed from prior exam. Small left pleural effusion. No pneumothorax. IMPRESSION: 1. Left IJ line in stable position. 2. Cardiomegaly with progressive bilateral pulmonary infiltrates suggesting pulmonary edema. Bilateral pneumonia cannot be excluded. Small left pleural effusion. Electronically Signed   By: Marcello Moores  Register   On: 03/26/2015 07:12     STUDIES:  CXR 2/15 > ?Multifocal pneumonia worsening  Echo > LV EF 65-70%, G1DD, Moderate AS, Calcified Mitral Annulus mild stenosis / mild regurg, LA mod dialation, trivial pericardial effusion.  RUE U/S > Negative TEE 2/16 > Nodules, HOCM, No endocarditis visible.  Ct chest 2/17 >> Extensive ground-glass attenuation airspace opacity and airspace consolidation involving all lobes of both lungs but predominantly the dependent portions of the lower lobes CXR 2/20 > unchanged  CULTURES: BAL Culture 2/15 > ng BAL CMV 2/16 > Pneumocystis Smear DFA 2/15 > Legionella Culture 2/15 >  Fungus Culture with Smear 2/15 > no yeast or fungus seen.  Blood Culture 2/10 > NG x 5 days RVP 2/11 > Negative  ANTIBIOTICS: Levaquin 2/10 - 2/14   SIGNIFICANT EVENTS: 2/10 > Admitted concern multifocal pneumonia vs. CHF exacerbation 2/13- 14 > little response to diuresis 2/15 > Progressive respiratory failure requiring intubation, bronchoscopy performed 2/16 > TEE not suggestive of endocarditis, has nodules on Mitral and aortic valves with possible embolization  LINES/TUBES: CVL 2/15 > Foley 2/15 > ETT 2/15 > 2/19  DISCUSSION: 65 y/o F with PMH above, initially admitted with SOB thought to be due to multifocal pneumonia vs. CHF, but no response to initial treatment and actually worsening respiratory status now requiring intubation.   ASSESSMENT / PLAN:  PULMONARY A: Acute Hypoxic Respiratory Failure  Multifocal Pneumonia vs.  Pulmonary Edema vs. Autoimmune Process.  Atypical Pneumonia - ? Mycobacterium, ? Fungal  ? PE - at risk (unlikely with infiltrates) ESR 96 P:   Extubated Nonrebreather as needed Reintubate if needed.  Started  IV solumedrol 2/19, 80 q 8h continue Concern for pulmonary edema > diuresing with some symptomatic improvement.   CARDIOVASCULAR A:  Hypertension - severe range pressures post - CVL Resolved.  Mitral Stenosis Acute diastolic HF - not responding to diuresis.  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  Cards following ? Cardiac MRI in future  ? RHC Diuresis with some improvement in symptoms.   RENAL A:   Diuresis for HF as above, over diruesis likely Mild AKI - resolved Hyperphosphatemia - resolved P:   Trending fluid balance, - 8L currently.  Holding lasix. Not overloaded to exam.    GASTROINTESTINAL A:   GI prophylaxis. Nutrition.  P:   SUP: Pantoprazole. Sips with chips When resp status improves > diet.   HEMATOLOGIC A:  Stable no issues Leukocytosis - steroids VTE Prophylaxis. P:  SCD's / lovenox CBC q am.   INFECTIOUS A:   Multifocal pneumonia ? Infectious vs inflammatory R/o aseptic endocarditis, autoimmune related, limpman sachs?, cancer? Febrile 2/17 P:   Abx per ID ID on board > Toxo immunoglobulin negative, cryptococcal ag negative.  CMV culture pending Histo ag neg.  CMV pcr pending Fungal Ab pending Mycoplasma ab neg Chlamydia panel pending Legionella cx pending Pneumocystis smear pending PCT negative so far.   - infectious workup has been mostly negative.  - no antibiotics at this time. No infectious etiology identified.   AUtoimmune A: r/o autoimmune process, doubt eosinophilc pna Dsdna neg, RA pos, CCP pos - favor RA  Increase solumedrol 80 q 8h > continue  ENDOCRINE A:   Stable, No issues  TSH > normal.  P:   SSI  as needed   NEUROLOGIC A:   Stable, no issues Sedated on Vent P:   Sedation:  none fent RASS goal: 0   Family updated: family updated. .   Interdisciplinary Family Meeting v Palliative Care Meeting: NA  Summary - Favor rheumatoid arthritis as a cause of BL infx & possibly vasculiltis. Tenuous respiratory status. Continuing steroids for now.   Paula Compton, MD Resident - PGY 2    03/26/2015 8:51 AM

## 2015-03-26 NOTE — Progress Notes (Signed)
Nutrition Follow-up  DOCUMENTATION CODES:   Morbid obesity  INTERVENTION:  -Ensure Enlive po BID, each supplement provides 350 kcal and 20 grams of protein  NUTRITION DIAGNOSIS:   Inadequate oral intake related to inability to eat as evidenced by NPO status.  Pt was just changed to [facility] Diet this AM, will update diagnosis as appropriate per next follow up  GOAL:   Patient will meet greater than or equal to 90% of their needs  Not yet met  MONITOR:   Vent status, Labs, Weight trends, TF tolerance, I & O's  REASON FOR ASSESSMENT:   Consult (Verbal Consult) Enteral/tube feeding initiation and management  ASSESSMENT:   65 y.o. female who initially presented with 1-2 days of a flu-like illness with progressive shortness of breath, generalized weakness, fatigue, associated with diarrhea. CXR in the ED was concerning for multifocal pneumonia. She was admitted to the hospital on 03/14/2014 and further workup was concerning for heart failure. She was found to have Mitral stenosis and Aortic stenosis. She underwent diuresis with little improvement in her symptoms, and her respiratory status actually has become worse over time. Was also treated for pneumonia, but CXR continued to demonstrate no resolution. Required intubation and transfer to the ICU on 2/15.  Pt was extubated 2/19, was given [facility] diet this morning. She has not received any trays yet, though RN ordered one for her during time of visit.  Now on Rebreather mask.  MD/Pulmonology/Cardiac work-up still being done. Pt has multiple pulmonary infiltrates and some possible CHF. Possible hypertrophic cardiomyopathy.  Follow for PO intake, supplement acceptance.  Labs: BUN 54 Medications: Solumedrol, Lopressor, Fentanyl PRN  Diet Order:  Diet Heart Room service appropriate?: Yes; Fluid consistency:: Thin  Skin:  Reviewed, no issues  Last BM:  2/13  Height:   Ht Readings from Last 1 Encounters:  03/20/15 5' 2"  (1.575 m)     Weight:   Wt Readings from Last 1 Encounters:  03/26/15 234 lb 9.1 oz (106.4 kg)    Ideal Body Weight:  50 kg  BMI:  Body mass index is 42.89 kg/(m^2).  Estimated Nutritional Needs:   Kcal:  8403-7543  Protein:  125 gm  Fluid:  1.6-1.8 L  EDUCATION NEEDS:   No education needs identified at this time  Satira Anis. Laasya Peyton, MS, RD LDN After Hours/Weekend Pager [phone]

## 2015-03-27 LAB — BASIC METABOLIC PANEL
ANION GAP: 9 (ref 5–15)
BUN: 45 mg/dL — ABNORMAL HIGH (ref 6–20)
CALCIUM: 8.8 mg/dL — AB (ref 8.9–10.3)
CO2: 27 mmol/L (ref 22–32)
Chloride: 105 mmol/L (ref 101–111)
Creatinine, Ser: 0.8 mg/dL (ref 0.44–1.00)
GFR calc Af Amer: >60 mL/min (ref >60)
GLUCOSE: 134 mg/dL — AB (ref 65–99)
Potassium: 4.5 mmol/L (ref 3.5–5.1)
Sodium: 141 mmol/L (ref 135–145)

## 2015-03-27 LAB — MISC LABCORP TEST (SEND OUT): Labcorp test code: 183805

## 2015-03-27 LAB — CULTURE, BLOOD (ROUTINE X 2): CULTURE: NO GROWTH

## 2015-03-27 LAB — CBC
HCT: 43.2 % (ref 36.0–46.0)
HEMOGLOBIN: 13.7 g/dL (ref 12.0–15.0)
MCH: 28.9 pg (ref 26.0–34.0)
MCHC: 31.7 g/dL (ref 30.0–36.0)
MCV: 91.1 fL (ref 78.0–100.0)
PLATELETS: 555 10*3/uL — AB (ref 150–400)
RBC: 4.74 MIL/uL (ref 3.87–5.11)
RDW: 13.3 % (ref 11.5–15.5)
WBC: 19.1 10*3/uL — ABNORMAL HIGH (ref 4.0–10.5)

## 2015-03-27 LAB — CULTURE, BLOOD (SINGLE): Culture: NO GROWTH

## 2015-03-27 MED ORDER — METHYLPREDNISOLONE SODIUM SUCC 40 MG IJ SOLR
40.0000 mg | Freq: Three times a day (TID) | INTRAMUSCULAR | Status: DC
  Administered 2015-03-27 – 2015-03-31 (×9): 40 mg via INTRAVENOUS
  Filled 2015-03-27 (×11): qty 1

## 2015-03-27 MED ORDER — METOPROLOL TARTRATE 1 MG/ML IV SOLN
5.0000 mg | INTRAVENOUS | Status: DC
  Administered 2015-03-27 – 2015-03-28 (×4): 5 mg via INTRAVENOUS
  Filled 2015-03-27 (×6): qty 5

## 2015-03-27 NOTE — Progress Notes (Signed)
Physical Therapy Treatment Patient Details Name: Rachel Baker MRN: [id number] DOB: [birth year] Today's Date: 03/27/2015    History of Present Illness pt presents with CAP and recent flu.  pt with hx of Carpal Tunnel Release and Cataract Extraction as only PMH.  On 2/15, pt began to develop worsening respiratory status with increasing O2 requirement, required transfer to the ICU, and intubation2/15-2/19. Bronchoscopy performed as well    PT Comments    Patient progressing slowly towards PT goals. Continues to be limited by respiratory status and drop in Sp02 with mobility. Tolerated SPT to/from [facility] with Min A. Needed to change from nasal cannula to NRB mask due to sats in low 80s and not being able to recover despite rest and cues for pursed lip breathing. Encouraged exercise in bed/chair during the day. Will follow acutely.   Follow Up Recommendations  Home health PT;Supervision for mobility/OOB     Equipment Recommendations  Other (comment) (TBD)    Recommendations for Other Services       Precautions / Restrictions Precautions Precautions: Fall Precaution Comments: monitor 02 Restrictions Weight Bearing Restrictions: No    Mobility  Bed Mobility Overal bed mobility: Modified Independent Bed Mobility: Supine to Sit;Sit to Supine     Supine to sit: Supervision;HOB elevated Sit to supine: Supervision   General bed mobility comments: No physical assist needed but pt very anxious sitting EOB. Sp02 decreased to low 80s on 6L/min 02 [place].  Transfers Overall transfer level: Needs assistance Equipment used: None Transfers: Sit to/from [employer] Sit to Stand: Min assist Stand pivot transfers: Min assist       General transfer comment: Min A to boost from EOB with cues for hand placement. SPT bed to/from [facility] with Min A for balance. Changed to NRB mask 100% Fi02 with Sp02 ranging from 80-90% with cues for pursed lip  breathing.  Ambulation/Gait Ambulation/Gait assistance:  (Deferred secondary to unstable respiratory sats)               Stairs            Wheelchair Mobility    Modified Rankin (Stroke Patients Only)       Balance Overall balance assessment: Needs assistance Sitting-balance support: Feet supported;No upper extremity supported Sitting balance-Leahy Scale: Good     Standing balance support: During functional activity Standing balance-Leahy Scale: Fair Standing balance comment: requires BUE support dring standing today for pericare.                     Cognition Arousal/Alertness: Awake/alert Behavior During Therapy: Anxious Overall Cognitive Status: Within Functional Limits for tasks assessed                      Exercises      General Comments General comments (skin integrity, edema, etc.): Changed from [place] to NRB due to low -2 sats during session. RN present in room. SP02 ranged from 80-88% on NRB mask 100%.      Pertinent Vitals/Pain Pain Assessment: No/denies pain    Home Living                      Prior Function            PT Goals (current goals can now be found in the care plan section) Progress towards PT goals: Progressing toward goals    Frequency  Min 3X/week    PT Plan Current plan remains appropriate  Co-evaluation             End of Session Equipment Utilized During Treatment: Oxygen (NRB mask) Activity Tolerance: Treatment limited secondary to medical complications (Comment) (drop in Sp02) Patient left: in bed;with call bell/phone within reach;with bed alarm set     Time: 1400-1431 PT Time Calculation (min) (ACUTE ONLY): 31 min  Charges:  $Therapeutic Activity: 23-37 mins                    G Codes:      Adonica Fukushima A Leba Tibbitts 03/27/2015, 3:11 PM Mylo Red, PT, DPT [phone]

## 2015-03-27 NOTE — Progress Notes (Signed)
SUBJECTIVE: Breathing is better. No pain.   Tele: Sinus  BP 130/85 mmHg  Pulse 86  Temp(Src) 97.8 F (36.6 C) (Oral)  Resp 36  Ht 5\' 2"  (1.575 m)  Wt 236 lb 1.8 oz (107.1 kg)  BMI 43.17 kg/m2  SpO2 93%  Intake/Output Summary (Last 24 hours) at 03/27/15 1240 Last data filed at 03/27/15 1100  Gross per 24 hour  Intake    830 ml  Output   1455 ml  Net   -625 ml    PHYSICAL EXAM General: Well developed, well nourished, in no acute distress. Alert and oriented x 3.  Psych:  Good affect, responds appropriately Neck: No JVD. No masses noted.  Lungs: coarse BS bilaterally  Heart: RRR with slight murmur noted.  Abdomen: Bowel sounds are present. Soft, non-tender.  Extremities: No lower extremity edema.   LABS: Basic Metabolic Panel:  Recent Labs  03/29/15 0420 03/27/15 0515  NA 144 141  K 4.6 4.5  CL 108 105  CO2 28 27  GLUCOSE 119* 134*  BUN 54* 45*  CREATININE 0.84 0.80  CALCIUM 9.3 8.8*   CBC:  Recent Labs  03/26/15 0420 03/27/15 0515  WBC 18.5* 19.1*  HGB 13.6 13.7  HCT 43.7 43.2  MCV 92.4 91.1  PLT 586* 555*   Current Meds: . antiseptic oral rinse  7 mL Mouth Rinse BID  . aspirin  81 mg Per Tube Daily  . enoxaparin (LOVENOX) injection  40 mg Subcutaneous Q24H  . feeding supplement (ENSURE ENLIVE)  237 mL Oral BID BM  . fentaNYL (SUBLIMAZE) injection  50 mcg Intravenous Once  . furosemide  40 mg Intravenous Daily  . methylPREDNISolone (SOLU-MEDROL) injection  125 mg Intravenous 3 times per day  . metoprolol  5 mg Intravenous 4 times per day  . pantoprazole (PROTONIX) IV  40 mg Intravenous Q24H   Echo 03/16/15: Left ventricle: The cavity size was normal. Wall thickness was increased in a pattern of severe LVH. Systolic function was vigorous. The estimated ejection fraction was in the range of 65% to 70%. Wall motion was normal; there were no regional wall motion abnormalities. Doppler parameters are consistent with abnormal left  ventricular relaxation (grade 1 diastolic dysfunction). Doppler parameters are consistent with high ventricular filling pressure. - Aortic valve: Valve mobility was restricted. 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 - Mitral valve: Severely calcified annulus. The findings are consistent with mild stenosis. There was mild regurgitation. Valve area by pressure half-time: 1.96 cm^2. - Left atrium: The atrium was moderately dilated. - Pericardium, extracardiac: A trivial pericardial effusion was identified. Impressions: - Vigorous LV function; grade 1 diastolic dysfunction with elevated LV filling pressure; severe LVH; intracavitary gradient of 3.3 m/s; moderate LAE; severe MAC with mild MR and mild MS; moderate AS (mean gradient 20 mmHg).  TEE: 03/21/15: Left ventricle: Severe LVH with more significant focal basal septal hypertrophy suggestive of HOCM. There is subvalvular acceleration and outflow tract obstruction. The estimated ejection fraction was 75%. Wall motion was normal; there were no regional wall motion abnormalities. - Aortic valve: Trileaflet. There is a calcified nodule on the NCC. - Mitral valve: SAM, mild posteriorly directed regurgitation. Calcified mass on the subvalvular apparatus of the posterior leaflet. - Left atrium: The atrium was dilated. No evidence of thrombus in the atrial cavity or appendage. - Right atrium: No evidence of thrombus in the atrial cavity or appendage. - Atrial septum: No defect or patent foramen ovale was identified. - Tricuspid valve: No evidence  of vegetation. - Pulmonic valve: No evidence of vegetation. Impressions: - No endocarditis. Calcified nodules on the NCC of the aortic valve and mitral valve apparatus, with MAC. There is SAM of the anterior leaflet with mild, posteriorly directed MR. There is significant LVOT obstruction (see surface echo) with severe basal septal hypertrophy consistent with  HOCM. No aortic stenosis is identified. EF 75%.   ASSESSMENT AND PLAN:  1. Acute diastolic heart failure: She continues to diurese well with IV Lasix. Will plan right heart cath to assess pressures when respiratory status is improved. Working diagnoses by pulmonary team is that this is an inflammatory process vs auto-immunue process. With overlying pulmonary edema. She is on steroids. Negative 10 liters since admission.   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 There are degenerative valvular changes but there is subvalvular obstruction by TEE. Continue beta blocker. Would avoid vasodilating drugs. If she develops hypotension she should be treated with a pure vasoconstrictor (phenylephrine). She should not be given positive inotropes (dopamine, norepinephrine) as these could worsen her hemodynamics.   3. Possible infectious endocarditis versus intracardiac mass: TEE shows an unusual hyperechoic nodule attached to the posterior mitral valve apparatus on the ventricular side. Possible that this could be the cardioembolic source for her finger lesion. It looks very unusual for a vegetation. Clinically this does not appear to be endocarditis. It could be an atypical myxoma or an unusually large papillary fibroelastoma.  Would plan cardiac MRI when she is more stable to better define.     Rachel Baker,Rachel Baker  2/22/201712:40 PM

## 2015-03-27 NOTE — Progress Notes (Signed)
PULMONARY / CRITICAL CARE MEDICINE   Name: Rachel Baker MRN: [id number] DOB: 10-30-1950    ADMISSION DATE:  03/15/2015  CONSULTATION DATE:  03/20/2015  REFERRING MD:  Jonetta Osgood, MD  CHIEF COMPLAINT:  Shortness of breath.   HISTORY OF PRESENT ILLNESS:  Rachel Baker is a 65 y.o. female with PMH as outlined below. She initially presented with 1-2 days of a flu-like illness with progressive shortness of breath, generalized weakness, fatigue, associated with diarrhea. No fever, chills, LE edema, chest pain, nausea or vomiting. She had progressive SOB, was evaluated in the ED with CXR concerning for multifocal pneumonia, and troponin elevation. She was admitted to the hospital on 03/14/2014 and further workup was concerning for heart failure. She was found to have Mitral stenosis and Aortic stenosis. BNP was very low. She underwent diuresis with little improvement in her symptoms, and her respiratory status actually has become worse over time. Was also treated for pneumonia, but CXR continued to demonstrate no resolution. She developed a purple lesion on the tip of her right long finger with concern for embolic source. Additionally, with worsening CXR and respiratory status in spite of treatment, autoimmune workup was also performed which has been negative except for mildly elevated RF.   On 2/15 - she began to develop worsening respiratory status with increasing O2 requirement. She required transfer to the ICU, and intubation. Bronchoscopy performed as well.   SUBJECTIVE:  Continued need for respiratory support.  Controlling HR and UOP to maintain cardiac physiology Off NRB overnight, requiring intermittently now. Nasal canula mostly.     VITAL SIGNS: BP 145/79 mmHg  Pulse 74  Temp(Src) 98.4 F (36.9 C) (Oral)  Resp 33  Ht 5' 2" (1.575 m)  Wt 236 lb 1.8 oz (107.1 kg)  BMI 43.17 kg/m2  SpO2 93%   INTAKE / OUTPUT: I/O last 3 completed shifts: In: 1000 [P.O.:400;  I.V.:360; Other:240] Out: 3105 [Urine:3105]   PHYSICAL EXAMINATION: General: resting comfortably in bed.  Neuro: AAOx3, conversant.    HEENT: [place]/AT. PERRL Cardiovascular: RRR, harsh systolic murmur remains, No gallops or rubs, S1/S2.  Lungs: Respirations labored, even, Coarse sounds bilaterally continue Abdomen: BS x 4, soft, NT/ND.  Musculoskeletal: No gross deformities, no edema.  Skin: Intact, warm, no rashes. Noted Blue discoloration of right long finger waxing and waning severity of cyanosis.    LABS:  BMET  Recent Labs Lab 03/25/15 0457 03/26/15 0420 03/27/15 0515  NA 143 144 141  K 4.6 4.6 4.5  CL 108 108 105  CO2 _0 BUN 44* 54* 45*  CREATININE 0.75 0.84 0.80  GLUCOSE 136* 119* 134*    Electrolytes  Recent Labs Lab 03/21/15 0400 03/22/15 0330  03/25/15 0457 03/26/15 0420 03/27/15 0515  CALCIUM 8.9 9.0  < > 9.0 9.3 8.8*  MG 2.0 2.2  --   --   --   --   PHOS 7.3* 4.1  --   --   --   --   < > = values in this interval not displayed.  CBC  Recent Labs Lab 03/25/15 0457 03/26/15 0420 03/27/15 0515  WBC 15.0* 18.5* 19.1*  HGB 13.5 13.6 13.7  HCT 41.9 43.7 43.2  PLT 544* 586* 555*    Coag's No results for input(s): APTT, INR in the last 168 hours.  Sepsis Markers No results for input(s): LATICACIDVEN, PROCALCITON, O2SATVEN in the last 168 hours.  ABG  Recent Labs Lab 03/21/15 0013 03/22/15 0245 03/23/15 0353  PHART 7.341* 7.356 7.383  PCO2ART 49.4* 49.6* 46.8*  PO2ART 159.0* 73.0* 72.7*    Liver Enzymes No results for input(s): AST, ALT, ALKPHOS, BILITOT, ALBUMIN in the last 168 hours.  Cardiac Enzymes No results for input(s): TROPONINI, PROBNP in the last 168 hours.  Glucose  Recent Labs Lab 03/24/15 1740 03/24/15 1921 03/24/15 2343 03/25/15 0335 03/25/15 0829 03/25/15 1241  GLUCAP 107* 117* 127* 101* 124* 114*    Imaging No results found.   STUDIES:  CXR 2/15 > ?Multifocal pneumonia worsening  Echo > LV EF  65-70%, G1DD, Moderate AS, Calcified Mitral Annulus mild stenosis / mild regurg, LA mod dialation, trivial pericardial effusion.  RUE U/S > Negative TEE 2/16 > Nodules, HOCM, No endocarditis visible.  Ct chest 2/17 >> Extensive ground-glass attenuation airspace opacity and airspace consolidation involving all lobes of both lungs but predominantly the dependent portions of the lower lobes CXR 2/20 > unchanged  CULTURES: BAL Culture 2/15 > ng BAL CMV 2/16 > ng Pneumocystis Smear DFA 2/15 > Legionella Culture 2/15 >  Fungus Culture with Smear 2/15 > no yeast or fungus seen.  Blood Culture 2/10 > NG x 5 days RVP 2/11 > Negative  ANTIBIOTICS: Levaquin 2/10 - 2/14   SIGNIFICANT EVENTS: 2/10 > Admitted concern multifocal pneumonia vs. CHF exacerbation 2/13- 14 > little response to diuresis 2/15 > Progressive respiratory failure requiring intubation, bronchoscopy performed 2/16 > TEE not suggestive of endocarditis, has nodules on Mitral and aortic valves with possible embolization  LINES/TUBES: CVL 2/15 > Foley 2/15 > ETT 2/15 > 2/19  DISCUSSION: 65 y/o F with PMH above, initially admitted with SOB thought to be due to multifocal pneumonia vs. CHF, but no response to initial treatment and actually worsening respiratory status now requiring intubation.   ASSESSMENT / PLAN:  PULMONARY A: Acute Hypoxic Respiratory Failure  Multifocal Pneumonia vs. Pulmonary Edema vs. Autoimmune Process.  Atypical Pneumonia - ? Mycobacterium, ? Fungal  ? PE - at risk (unlikely with infiltrates) ESR 96 P:   Extubated Improving respiratory function.  Reintubate if needed.  Started  IV solumedrol increased to 125mg q8.for now Concern for pulmonary edema > watchin fluid balance. Lasix once daily.   CARDIOVASCULAR A:  Hypertension - severe range pressures post - CVL Resolved.  Mitral Stenosis Acute diastolic HF - not responding to diuresis.  Aortic Stenosis - HOCM on TEE TEE > Mitral and Aortic  valve nodules P:  Lopressor to keep HR low for preserved physiology Labetalol prn HTN.  Cards following ? Cardiac MRI in future   ? RHC Diuresis with some improvement in symptoms.   RENAL A:   Diuresis for HF as above, over diruesis likely Mild AKI - resolved Hyperphosphatemia - resolved P:   Trending fluid balance, - 8L currently.  Lasix 40 daily.   GASTROINTESTINAL A:   GI prophylaxis. Nutrition.  P:   SUP: Pantoprazole.  Full diet   HEMATOLOGIC A:   Stable no issues Leukocytosis - steroids VTE Prophylaxis. P:  SCD's / lovenox CBC q am.   INFECTIOUS A:   Multifocal pneumonia ? Infectious vs inflammatory R/o aseptic endocarditis, autoimmune related, limpman sachs?, cancer? Febrile 2/17 P:   Abx per ID ID on board > Toxo immunoglobulin negative, cryptococcal ag negative.  CMV culture pending Histo ag neg.  CMV pcr pending Fungal Ab pending Mycoplasma ab neg Chlamydia panel pending Legionella cx pending Pneumocystis smear pending PCT negative so far.   - infectious workup has been mostly negative.  -   no antibiotics at this time. No infectious etiology identified.   AUtoimmune A: r/o autoimmune process, doubt eosinophilc pna Dsdna neg, RA pos, CCP pos - favor RA  Increase solumedrol 151m q 8h > continue  ENDOCRINE A:   Stable, No issues  TSH > normal.  P:   SSI as needed   NEUROLOGIC A:   Stable, no issues Sedated on Vent P:   Sedation:  none fent RASS goal: 0   Family updated: family updated. .   Interdisciplinary Family Meeting v Palliative Care Meeting: NA  Summary - Favor rheumatoid arthritis as a cause of BL infx & possibly vasculiltis. Tenuous respiratory status. Somewhat improving. Increased steroids. Continue for now.    CPaula Compton MD Resident - PGY 2    03/27/2015 11:18 AM

## 2015-03-28 ENCOUNTER — Inpatient Hospital Stay (HOSPITAL_COMMUNITY): Payer: Medicare HMO

## 2015-03-28 LAB — CBC
HEMATOCRIT: 44 % (ref 36.0–46.0)
HEMOGLOBIN: 13.8 g/dL (ref 12.0–15.0)
MCH: 28.9 pg (ref 26.0–34.0)
MCHC: 31.4 g/dL (ref 30.0–36.0)
MCV: 92.1 fL (ref 78.0–100.0)
Platelets: 530 10*3/uL — ABNORMAL HIGH (ref 150–400)
RBC: 4.78 MIL/uL (ref 3.87–5.11)
RDW: 13.6 % (ref 11.5–15.5)
WBC: 17.4 10*3/uL — ABNORMAL HIGH (ref 4.0–10.5)

## 2015-03-28 LAB — BASIC METABOLIC PANEL
ANION GAP: 11 (ref 5–15)
BUN: 47 mg/dL — ABNORMAL HIGH (ref 6–20)
CHLORIDE: 105 mmol/L (ref 101–111)
CO2: 27 mmol/L (ref 22–32)
Calcium: 8.9 mg/dL (ref 8.9–10.3)
Creatinine, Ser: 0.89 mg/dL (ref 0.44–1.00)
GFR calc non Af Amer: >60 mL/min (ref >60)
GLUCOSE: 125 mg/dL — AB (ref 65–99)
POTASSIUM: 5.2 mmol/L — AB (ref 3.5–5.1)
Sodium: 143 mmol/L (ref 135–145)

## 2015-03-28 LAB — ANTI-DNA ANTIBODY, DOUBLE-STRANDED

## 2015-03-28 LAB — RHEUMATOID FACTOR: Rhuematoid fact SerPl-aCnc: 16.5 IU/mL — ABNORMAL HIGH (ref 0.0–13.9)

## 2015-03-28 LAB — ANTI-SCLERODERMA ANTIBODY: Scleroderma (Scl-70) (ENA) Antibody, IgG: <0.2 AI (ref 0.0–0.9)

## 2015-03-28 LAB — MPO/PR-3 (ANCA) ANTIBODIES: ANCA Proteinase 3: <3.5 U/mL (ref 0.0–3.5)

## 2015-03-28 LAB — CRYOGLOBULIN

## 2015-03-28 MED ORDER — METOPROLOL TARTRATE 50 MG PO TABS
50.0000 mg | ORAL_TABLET | Freq: Two times a day (BID) | ORAL | Status: DC
  Administered 2015-03-28 – 2015-04-04 (×13): 50 mg via ORAL
  Filled 2015-03-28 (×14): qty 1

## 2015-03-28 NOTE — Progress Notes (Addendum)
SUBJECTIVE: No pain. Breathing is improving.   BP 130/70 mmHg  Pulse 62  Temp(Src) 97.8 F (36.6 C) (Oral)  Resp 16  Ht 5\' 2"  (1.575 m)  Wt 235 lb 7.2 oz (106.8 kg)  BMI 43.05 kg/m2  SpO2 99%  Intake/Output Summary (Last 24 hours) at 03/28/15 0725 Last data filed at 03/28/15 0500  Gross per 24 hour  Intake    110 ml  Output   2200 ml  Net  -2090 ml    PHYSICAL EXAM General: Well developed, well nourished, in no acute distress. Alert and oriented x 3.  Psych:  Good affect, responds appropriately Neck: No JVD. No masses noted.  Lungs: Coarse BS bilaterally.   Heart: RRR with systolic murmur noted.  Abdomen: Bowel sounds are present. Soft, non-tender.  Extremities: No lower extremity edema.   LABS: Basic Metabolic Panel:  Recent Labs  2091 0515 03/28/15 0400  NA 141 143  K 4.5 5.2*  CL 105 105  CO2 27 27  GLUCOSE 134* 125*  BUN 45* 47*  CREATININE 0.80 0.89  CALCIUM 8.8* 8.9   CBC:  Recent Labs  03/27/15 0515 03/28/15 0400  WBC 19.1* 17.4*  HGB 13.7 13.8  HCT 43.2 44.0  MCV 91.1 92.1  PLT 555* 530*   Current Meds: . antiseptic oral rinse  7 mL Mouth Rinse BID  . aspirin  81 mg Per Tube Daily  . enoxaparin (LOVENOX) injection  40 mg Subcutaneous Q24H  . feeding supplement (ENSURE ENLIVE)  237 mL Oral BID BM  . fentaNYL (SUBLIMAZE) injection  50 mcg Intravenous Once  . furosemide  40 mg Intravenous Daily  . methylPREDNISolone (SOLU-MEDROL) injection  40 mg Intravenous 3 times per day  . metoprolol  5 mg Intravenous Q4H  . pantoprazole (PROTONIX) IV  40 mg Intravenous Q24H    Echo 03/16/15: Left ventricle: The cavity size was normal. Wall thickness was increased in a pattern of severe LVH. Systolic function was vigorous. The estimated ejection fraction was in the range of 65% to 70%. Wall motion was normal; there were no regional wall motion abnormalities. Doppler parameters are consistent with abnormal left ventricular  relaxation (grade 1 diastolic dysfunction). Doppler parameters are consistent with high ventricular filling pressure. - Aortic valve: Valve mobility was restricted. 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 - Mitral valve: Severely calcified annulus. The findings are consistent with mild stenosis. There was mild regurgitation. Valve area by pressure half-time: 1.96 cm^2. - Left atrium: The atrium was moderately dilated. - Pericardium, extracardiac: A trivial pericardial effusion was identified. Impressions: - Vigorous LV function; grade 1 diastolic dysfunction with elevated LV filling pressure; severe LVH; intracavitary gradient of 3.3 m/s; moderate LAE; severe MAC with mild MR and mild MS; moderate AS (mean gradient 20 mmHg).  TEE: 03/21/15: Left ventricle: Severe LVH with more significant focal basal septal hypertrophy suggestive of HOCM. There is subvalvular acceleration and outflow tract obstruction. The estimated ejection fraction was 75%. Wall motion was normal; there were no regional wall motion abnormalities. - Aortic valve: Trileaflet. There is a calcified nodule on the NCC. - Mitral valve: SAM, mild posteriorly directed regurgitation. Calcified mass on the subvalvular apparatus of the posterior leaflet. - Left atrium: The atrium was dilated. No evidence of thrombus in the atrial cavity or appendage. - Right atrium: No evidence of thrombus in the atrial cavity or appendage. - Atrial septum: No defect or patent foramen ovale was identified. - Tricuspid valve: No evidence of vegetation. - Pulmonic valve:  No evidence of vegetation. Impressions: - No endocarditis. Calcified nodules on the NCC of the aortic valve and mitral valve apparatus, with MAC. There is SAM of the anterior leaflet with mild, posteriorly directed MR. There is significant LVOT obstruction (see surface echo) with severe basal septal hypertrophy consistent with HOCM. No aortic  stenosis is identified. EF 75%.   ASSESSMENT AND PLAN:  1. Acute diastolic heart failure: She continues to diurese well with IV Lasix. Negative 2 liters last 24 hours and 12 liters since admission. Her respiratory status is improving and renal function is stable so would continue IV Lasix today. Will plan right heart cath to assess pressures when respiratory status is improved. Given her elevated troponin during this acute process, will likely perform left heart cath as well to exclude CAD. She may be ready for this tomorrow. I will see early tomorrow to plan. Working diagnoses by pulmonary team is that this is an inflammatory process vs auto-immunue process with overlying pulmonary edema. She is on steroids.   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 There are degenerative valvular changes but there is subvalvular obstruction by TEE. Continue beta blocker. Would avoid vasodilating drugs. If she develops hypotension she should be treated with a pure vasoconstrictor (phenylephrine). She should not be given positive inotropes (dopamine, norepinephrine) as these could worsen her hemodynamics.   3. Possible infectious endocarditis versus intracardiac mass: TEE shows an unusual hyperechoic nodule attached to the posterior mitral valve apparatus on the ventricular side. Possible that this could be the cardioembolic source for her finger lesion. It looks very unusual for a vegetation. Clinically this does not appear to be endocarditis. It could be an atypical myxoma or an unusually large papillary fibroelastoma. Would plan cardiac MRI next week when she is more stable to better define.   Rachel Baker  2/23/20177:25 AM

## 2015-03-28 NOTE — Progress Notes (Signed)
PULMONARY / CRITICAL CARE MEDICINE   Name: Daviana Haymaker MRN: [id number] DOB: December 21, 1950    ADMISSION DATE:  03/15/2015  CONSULTATION DATE:  03/20/2015  REFERRING MD:  Jonetta Osgood, MD  CHIEF COMPLAINT:  Shortness of breath.   HISTORY OF PRESENT ILLNESS:  Rachel Baker is a 65 y.o. female with PMH as outlined below. She initially presented with 1-2 days of a flu-like illness with progressive shortness of breath, generalized weakness, fatigue, associated with diarrhea. No fever, chills, LE edema, chest pain, nausea or vomiting. She had progressive SOB, was evaluated in the ED with CXR concerning for multifocal pneumonia, and troponin elevation. She was admitted to the hospital on 03/14/2014 and further workup was concerning for heart failure. She was found to have Mitral stenosis and Aortic stenosis. BNP was very low. She underwent diuresis with little improvement in her symptoms, and her respiratory status actually has become worse over time. Was also treated for pneumonia, but CXR continued to demonstrate no resolution. She developed a purple lesion on the tip of her right long finger with concern for embolic source. Additionally, with worsening CXR and respiratory status in spite of treatment, autoimmune workup was also performed which has been negative except for mildly elevated RF.   On 2/15 - she began to develop worsening respiratory status with increasing O2 requirement. She required transfer to the ICU, and intubation. Bronchoscopy performed as well.   SUBJECTIVE:  HR better Intermittent NRB requirement with exertion Otherwise able to stay on [place] while resting Feeling better with getting up.   VITAL SIGNS: BP 128/71 mmHg  Pulse 70  Temp(Src) 97.4 F (36.3 C) (Oral)  Resp 21  Ht 5' 2"  (1.575 m)  Wt 235 lb 7.2 oz (106.8 kg)  BMI 43.05 kg/m2  SpO2 88%   INTAKE / OUTPUT: I/O last 3 completed shifts: In: 230 [I.V.:230] Out: 2830 [Urine:2830]   PHYSICAL  EXAMINATION: General: resting comfortably in chair at bedside.   Neuro: AAOx3, conversant.    HEENT: [place]/AT. PERRL Cardiovascular: RRR, harsh systolic murmur remains, No gallops or rubs, S1/S2.  Lungs: Respirations labored, even, Coarse sounds bilaterally remain. Crackles remain.  Abdomen: BS x 4, soft, NT/ND.  Musculoskeletal: No gross deformities, no edema.  Skin: Intact, warm, no rashes. Noted Blue discoloration of right long finger improving this am.   LABS:  BMET  Recent Labs Lab 03/26/15 0420 03/27/15 0515 03/28/15 0400  NA 144 141 143  K 4.6 4.5 5.2*  CL 108 105 105  CO2 28 27 27   BUN 54* 45* 47*  CREATININE 0.84 0.80 0.89  GLUCOSE 119* 134* 125*    Electrolytes  Recent Labs Lab 03/22/15 0330  03/26/15 0420 03/27/15 0515 03/28/15 0400  CALCIUM 9.0  < > 9.3 8.8* 8.9  MG 2.2  --   --   --   --   PHOS 4.1  --   --   --   --   < > = values in this interval not displayed.  CBC  Recent Labs Lab 03/26/15 0420 03/27/15 0515 03/28/15 0400  WBC 18.5* 19.1* 17.4*  HGB 13.6 13.7 13.8  HCT 43.7 43.2 44.0  PLT 586* 555* 530*    Coag's No results for input(s): APTT, INR in the last 168 hours.  Sepsis Markers No results for input(s): LATICACIDVEN, PROCALCITON, O2SATVEN in the last 168 hours.  ABG  Recent Labs Lab 03/22/15 0245 03/23/15 0353  PHART 7.356 7.383  PCO2ART 49.6* 46.8*  PO2ART 73.0* 72.7*  Liver Enzymes No results for input(s): AST, ALT, ALKPHOS, BILITOT, ALBUMIN in the last 168 hours.  Cardiac Enzymes No results for input(s): TROPONINI, PROBNP in the last 168 hours.  Glucose  Recent Labs Lab 03/24/15 1740 03/24/15 1921 03/24/15 2343 03/25/15 0335 03/25/15 0829 03/25/15 1241  GLUCAP 107* 117* 127* 101* 124* 114*    Imaging Dg Chest Port 1 View  03/28/2015  CLINICAL DATA:  Patient with respiratory failure. EXAM: PORTABLE CHEST 1 VIEW COMPARISON:  Chest radiograph 03/26/2015. FINDINGS: Interval removal central venous  catheter. Stable cardiac and mediastinal contours. Low lung volumes. Improving bilateral mid and lower lung airspace opacities. No definite pneumothorax. Possible small bilateral pleural effusions. IMPRESSION: Interval removal central venous catheter. Slight interval improvement bilateral airspace opacities which may represent improving edema or infection. Possible small bilateral pleural effusions. Electronically Signed   By: Lovey Newcomer M.D.   On: 03/28/2015 11:23     STUDIES:  CXR 2/15 > ?Multifocal pneumonia worsening  Echo > LV EF 65-70%, G1DD, Moderate AS, Calcified Mitral Annulus mild stenosis / mild regurg, LA mod dialation, trivial pericardial effusion.  RUE U/S > Negative TEE 2/16 > Nodules, HOCM, No endocarditis visible.  Ct chest 2/17 >> Extensive ground-glass attenuation airspace opacity and airspace consolidation involving all lobes of both lungs but predominantly the dependent portions of the lower lobes CXR 2/20 > unchanged  CXR 2/23 > improving ?edema.   CULTURES: BAL Culture 2/15 > ng BAL CMV 2/16 > ng Pneumocystis Smear DFA 2/15 > Legionella Culture 2/15 >  Fungus Culture with Smear 2/15 > no yeast or fungus seen.  Blood Culture 2/10 > NG x 5 days RVP 2/11 > Negative  ANTIBIOTICS: Levaquin 2/10 - 2/14   SIGNIFICANT EVENTS: 2/10 > Admitted concern multifocal pneumonia vs. CHF exacerbation 2/13- 14 > little response to diuresis 2/15 > Progressive respiratory failure requiring intubation, bronchoscopy performed 2/16 > TEE not suggestive of endocarditis, has nodules on Mitral and aortic valves with possible embolization  LINES/TUBES: CVL 2/15 > Foley 2/15 > ETT 2/15 > 2/19  DISCUSSION: 65 y/o F with PMH above, initially admitted with SOB thought to be due to multifocal pneumonia vs. CHF, but no response to initial treatment and actually worsening respiratory status now requiring intubation.   ASSESSMENT / PLAN:  PULMONARY A: Acute Hypoxic Respiratory  Failure  Multifocal Pneumonia vs. Pulmonary Edema vs. Autoimmune Process.  Atypical Pneumonia - ? Mycobacterium, ? Fungal  ? PE - at risk (unlikely with infiltrates) ESR 96 P:   Extubated Slowly improved respiratory function.  Decreased steroids to 40 TID  Concern for pulmonary edema > watching fluid balance. Lasix once daily.   CARDIOVASCULAR A:  Hypertension - severe range pressures post - CVL Resolved.  Mitral Stenosis Acute diastolic HF - not responding to diuresis.  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  Cards following ? Cardiac MRI in future   Catheterization tomorrow.  Diuresis with some improvement in symptoms.   RENAL A:   Diuresis for HF as above, over diruesis likely Mild AKI - resolved Hyperphosphatemia - resolved P:   Trending fluid balance, - 11L currently.  Lasix 40 daily.   GASTROINTESTINAL A:   GI prophylaxis. Nutrition.  P:   SUP: Pantoprazole.  Full diet   HEMATOLOGIC A:   Stable no issues Leukocytosis - steroids VTE Prophylaxis. P:  SCD's / lovenox CBC q am.  INFECTIOUS A:   Multifocal pneumonia ? Infectious vs inflammatory R/o aseptic endocarditis, autoimmune related, limpman sachs?, cancer? Febrile 2/17 P:   Abx per ID ID on board > Toxo immunoglobulin negative, cryptococcal ag negative.  CMV culture pending Histo ag neg.  CMV pcr negative. Fungal Ab negative Mycoplasma ab neg Chlamydia panel pending Legionella cx pending Pneumocystis smear pending PCT negative so far.   - infectious workup has been negative.  - no antibiotics at this time. No infectious etiology identified.   AUtoimmune A: r/o autoimmune process, doubt eosinophilc pna Dsdna neg, RA pos, CCP pos - favor RA  Solumedrol 40 TID Discussed with Rheum. Little likelihood of being related to rheumatoid.   ENDOCRINE A:   Stable, No issues  TSH >  normal.  P:   SSI as needed   NEUROLOGIC A:   Stable, no issues Sedated on Vent P:   Sedation:  none fent RASS goal: 0   Family updated: family updated. .   Interdisciplinary Family Meeting v Palliative Care Meeting: NA  Summary - Favor autoimmune process vs. HOCM. Tenuous respiratory status. Somewhat improving. Continuing steroids. Will see what HC shows tomorrow.     Paula Compton, MD Resident - PGY 2    03/28/2015 11:29 AM

## 2015-03-29 ENCOUNTER — Encounter (HOSPITAL_COMMUNITY)
Admission: EM | Disposition: A | Discharge status: Skilled Nursing Facility | Payer: Self-pay | Source: Home / Self Care | Attending: Internal Medicine

## 2015-03-29 DIAGNOSIS — I2510 Atherosclerotic heart disease of native coronary artery without angina pectoris: Secondary | ICD-10-CM

## 2015-03-29 HISTORY — PX: CARDIAC CATHETERIZATION: SHX172

## 2015-03-29 LAB — POCT I-STAT 3, ART BLOOD GAS (G3+)
ACID-BASE DEFICIT: 2 mmol/L (ref 0.0–2.0)
Bicarbonate: 21.9 mEq/L (ref 20.0–24.0)
O2 SAT: 95 %
TCO2: 23 mmol/L (ref 0–100)
pCO2 arterial: 31.8 mmHg — ABNORMAL LOW (ref 35.0–45.0)
pH, Arterial: 7.445 (ref 7.350–7.450)
pO2, Arterial: 70 mmHg — ABNORMAL LOW (ref 80.0–100.0)

## 2015-03-29 LAB — BASIC METABOLIC PANEL
ANION GAP: 7 (ref 5–15)
BUN: 43 mg/dL — AB (ref 6–20)
CALCIUM: 8.6 mg/dL — AB (ref 8.9–10.3)
CO2: 28 mmol/L (ref 22–32)
Chloride: 104 mmol/L (ref 101–111)
Creatinine, Ser: 0.75 mg/dL (ref 0.44–1.00)
GFR calc Af Amer: >60 mL/min (ref >60)
GLUCOSE: 110 mg/dL — AB (ref 65–99)
Potassium: 4.4 mmol/L (ref 3.5–5.1)
Sodium: 139 mmol/L (ref 135–145)

## 2015-03-29 LAB — POCT I-STAT 3, VENOUS BLOOD GAS (G3P V)
ACID-BASE EXCESS: 1 mmol/L (ref 0.0–2.0)
BICARBONATE: 25.4 meq/L — AB (ref 20.0–24.0)
O2 Saturation: 71 %
PH VEN: 7.427 — AB (ref 7.250–7.300)
TCO2: 27 mmol/L (ref 0–100)
pCO2, Ven: 38.6 mmHg — ABNORMAL LOW (ref 45.0–50.0)
pO2, Ven: 36 mmHg (ref 30.0–45.0)

## 2015-03-29 LAB — CBC
HEMATOCRIT: 42.6 % (ref 36.0–46.0)
Hemoglobin: 13.4 g/dL (ref 12.0–15.0)
MCH: 29.3 pg (ref 26.0–34.0)
MCHC: 31.5 g/dL (ref 30.0–36.0)
MCV: 93 fL (ref 78.0–100.0)
PLATELETS: 423 10*3/uL — AB (ref 150–400)
RBC: 4.58 MIL/uL (ref 3.87–5.11)
RDW: 13.8 % (ref 11.5–15.5)
WBC: 19.1 10*3/uL — ABNORMAL HIGH (ref 4.0–10.5)

## 2015-03-29 SURGERY — RIGHT/LEFT HEART CATH AND CORONARY ANGIOGRAPHY
Anesthesia: LOCAL

## 2015-03-29 MED ORDER — IOHEXOL 350 MG/ML SOLN
INTRAVENOUS | Status: DC | PRN
  Administered 2015-03-29: 70 mL via INTRACARDIAC

## 2015-03-29 MED ORDER — LIDOCAINE HCL (PF) 1 % IJ SOLN
INTRAMUSCULAR | Status: DC | PRN
  Administered 2015-03-29: 10 mL

## 2015-03-29 MED ORDER — SODIUM CHLORIDE 0.9 % IV SOLN: 250.0000 mL | INTRAVENOUS | Status: DC | PRN

## 2015-03-29 MED ORDER — ASPIRIN 81 MG PO CHEW
81.0000 mg | CHEWABLE_TABLET | ORAL | Status: AC
  Administered 2015-03-29: 81 mg via ORAL
  Filled 2015-03-29: qty 1

## 2015-03-29 MED ORDER — SODIUM CHLORIDE 0.9 % IV SOLN
INTRAVENOUS | Status: DC
  Administered 2015-03-29: 13:00:00 via INTRAVENOUS

## 2015-03-29 MED ORDER — HEPARIN (PORCINE) IN NACL 2-0.9 UNIT/ML-% IJ SOLN
INTRAMUSCULAR | Status: AC
  Filled 2015-03-29: qty 1500

## 2015-03-29 MED ORDER — SODIUM CHLORIDE 0.9 % IV SOLN: INTRAVENOUS | Status: AC

## 2015-03-29 MED ORDER — SODIUM CHLORIDE 0.9% FLUSH: 3.0000 mL | INTRAVENOUS | Status: DC | PRN

## 2015-03-29 MED ORDER — SODIUM CHLORIDE 0.9% FLUSH: 3.0000 mL | Freq: Two times a day (BID) | INTRAVENOUS | Status: DC

## 2015-03-29 MED ORDER — LIDOCAINE HCL (PF) 1 % IJ SOLN
INTRAMUSCULAR | Status: AC
  Filled 2015-03-29: qty 30

## 2015-03-29 MED ORDER — SODIUM CHLORIDE 0.9% FLUSH
3.0000 mL | Freq: Two times a day (BID) | INTRAVENOUS | Status: DC
  Administered 2015-03-30 – 2015-04-04 (×11): 3 mL via INTRAVENOUS

## 2015-03-29 MED ORDER — MIDAZOLAM HCL 2 MG/2ML IJ SOLN
INTRAMUSCULAR | Status: DC | PRN
Start: 2015-03-29 — End: 2015-03-29
  Administered 2015-03-29: 1 mg via INTRAVENOUS

## 2015-03-29 MED ORDER — FUROSEMIDE 40 MG PO TABS: 40.0000 mg | ORAL_TABLET | Freq: Every day | ORAL | Status: DC

## 2015-03-29 MED ORDER — MIDAZOLAM HCL 2 MG/2ML IJ SOLN
INTRAMUSCULAR | Status: AC
  Filled 2015-03-29: qty 2

## 2015-03-29 SURGICAL SUPPLY — 11 items
CATH INFINITI 5FR MULTPACK ANG (CATHETERS) ×2 IMPLANT
CATH SITESEER 5F NTR (CATHETERS) ×2 IMPLANT
CATH SWAN GANZ 7F STRAIGHT (CATHETERS) ×2 IMPLANT
KIT HEART LEFT (KITS) ×2 IMPLANT
KIT HEART RIGHT NAMIC (KITS) ×2 IMPLANT
PACK CARDIAC CATHETERIZATION (CUSTOM PROCEDURE TRAY) ×2 IMPLANT
SHEATH PINNACLE 5F 10CM (SHEATH) ×2 IMPLANT
SHEATH PINNACLE 7F 10CM (SHEATH) ×2 IMPLANT
SYR MEDRAD MARK V 150ML (SYRINGE) ×2 IMPLANT
TRANSDUCER W/STOPCOCK (MISCELLANEOUS) ×4 IMPLANT
WIRE EMERALD 3MM-J .035X150CM (WIRE) ×2 IMPLANT

## 2015-03-29 NOTE — Progress Notes (Signed)
PULMONARY / CRITICAL CARE MEDICINE   Name: Rachel Baker MRN: [id number] DOB: 20-Aug-1950    ADMISSION DATE:  03/15/2015  CONSULTATION DATE:  03/20/2015  REFERRING MD:  Jonetta Osgood, MD  CHIEF COMPLAINT:  Shortness of breath.   HISTORY OF PRESENT ILLNESS:  Rachel Baker is a 65 y.o. female with PMH as outlined below. She initially presented with 1-2 days of a flu-like illness with progressive shortness of breath, generalized weakness, fatigue, associated with diarrhea. No fever, chills, LE edema, chest pain, nausea or vomiting. She had progressive SOB, was evaluated in the ED with CXR concerning for multifocal pneumonia, and troponin elevation. She was admitted to the hospital on 03/14/2014 and further workup was concerning for heart failure. She was found to have Mitral stenosis and Aortic stenosis. BNP was very low. She underwent diuresis with little improvement in her symptoms, and her respiratory status actually has become worse over time. Was also treated for pneumonia, but CXR continued to demonstrate no resolution. She developed a purple lesion on the tip of her right long finger with concern for embolic source. Additionally, with worsening CXR and respiratory status in spite of treatment, autoimmune workup was also performed which has been negative except for mildly elevated RF.   On 2/15 - she began to develop worsening respiratory status with increasing O2 requirement. She required transfer to the ICU, and intubation. Bronchoscopy performed as well. Has since had slow improvement.   SUBJECTIVE:  HR controlled Feeling somewhat better, but still on nonrebreather.   VITAL SIGNS: BP 144/78 mmHg  Pulse 0  Temp(Src) 97.3 F (36.3 C) (Oral)  Resp 0  Ht 5' 2"  (1.575 m)  Wt 236 lb 1.8 oz (107.1 kg)  BMI 43.17 kg/m2  SpO2 0%   INTAKE / OUTPUT: I/O last 3 completed shifts: In: 1570 [P.O.:1560; I.V.:10] Out: 3075 [Urine:3075]   PHYSICAL EXAMINATION: General: resting  comfortably in bed.  Neuro: AAOx3, conversant.    HEENT: [place]/AT. PERRL Cardiovascular: RRR, harsh systolic murmur remains, No gallops or rubs, S1/S2.  Lungs: Respirations labored, even, Coarse sounds bilaterally remain. Crackles remain.  Abdomen: BS x 4, soft, NT/ND.  Musculoskeletal: No gross deformities, no edema.  Skin: Intact, warm, no rashes. Noted Blue discoloration of right long finger improving this am.   LABS:  BMET  Recent Labs Lab 03/27/15 0515 03/28/15 0400 03/29/15 0346  NA 141 143 139  K 4.5 5.2* 4.4  CL 105 105 104  CO2 27 27 28   BUN 45* 47* 43*  CREATININE 0.80 0.89 0.75  GLUCOSE 134* 125* 110*    Electrolytes  Recent Labs Lab 03/27/15 0515 03/28/15 0400 03/29/15 0346  CALCIUM 8.8* 8.9 8.6*    CBC  Recent Labs Lab 03/27/15 0515 03/28/15 0400 03/29/15 0346  WBC 19.1* 17.4* 19.1*  HGB 13.7 13.8 13.4  HCT 43.2 44.0 42.6  PLT 555* 530* 423*    Coag's No results for input(s): APTT, INR in the last 168 hours.  Sepsis Markers No results for input(s): LATICACIDVEN, PROCALCITON, O2SATVEN in the last 168 hours.  ABG  Recent Labs Lab 03/23/15 0353  PHART 7.383  PCO2ART 46.8*  PO2ART 72.7*    Liver Enzymes No results for input(s): AST, ALT, ALKPHOS, BILITOT, ALBUMIN in the last 168 hours.  Cardiac Enzymes No results for input(s): TROPONINI, PROBNP in the last 168 hours.  Glucose  Recent Labs Lab 03/24/15 1740 03/24/15 1921 03/24/15 2343 03/25/15 0335 03/25/15 0829 03/25/15 1241  GLUCAP 107* 117* 127* 101* 124* 114*  Imaging No results found.   STUDIES:  CXR 2/15 > ?Multifocal pneumonia worsening  Echo > LV EF 65-70%, G1DD, Moderate AS, Calcified Mitral Annulus mild stenosis / mild regurg, LA mod dialation, trivial pericardial effusion.  RUE U/S > Negative TEE 2/16 > Nodules, HOCM, No endocarditis visible.  Ct chest 2/17 >> Extensive ground-glass attenuation airspace opacity and airspace consolidation involving all  lobes of both lungs but predominantly the dependent portions of the lower lobes CXR 2/20 > unchanged  CXR 2/23 > improving ?edema.   CULTURES: BAL Culture 2/15 > ng BAL CMV 2/16 > ng Pneumocystis Smear DFA 2/15 > Legionella Culture 2/15 >  Fungus Culture with Smear 2/15 > no yeast or fungus seen.  Blood Culture 2/10 > NG x 5 days RVP 2/11 > Negative  ANTIBIOTICS: Levaquin 2/10 - 2/14   SIGNIFICANT EVENTS: 2/10 > Admitted concern multifocal pneumonia vs. CHF exacerbation 2/13- 14 > little response to diuresis 2/15 > Progressive respiratory failure requiring intubation, bronchoscopy performed 2/16 > TEE not suggestive of endocarditis, has nodules on Mitral and aortic valves with possible embolization  LINES/TUBES: CVL 2/15 > Foley 2/15 > ETT 2/15 > 2/19  DISCUSSION: 65 y/o F with PMH above, initially admitted with SOB thought to be due to multifocal pneumonia vs. CHF, but no response to initial treatment and actually worsening respiratory status now requiring intubation.   ASSESSMENT / PLAN:  PULMONARY A: Acute Hypoxic Respiratory Failure  Multifocal Pneumonia vs. Pulmonary Edema vs. Autoimmune Process.  Atypical Pneumonia - ? Mycobacterium, ? Fungal  ? PE - at risk (unlikely with infiltrates) ESR 96 P:   Extubated Slowly improved respiratory function.  Decreased steroids to 40 TID continue Concern for pulmonary edema > watching fluid balance. Discontinue lasix for now. Negative balance achieved.   CARDIOVASCULAR A:  Hypertension - severe range pressures post - CVL Resolved.  Mitral Stenosis Acute diastolic HF - not responding to diuresis.  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  Cards following ? Cardiac MRI in future   Catheterization today.  Diuresis with some improvement in symptoms. Holding lasix now.   RENAL A:   Diuresis for HF as above, over  diruesis likely Mild AKI - resolved Hyperphosphatemia - resolved P:   Trending fluid balance, - 12L currently.  D/c lasix.   GASTROINTESTINAL A:   GI prophylaxis. Nutrition.  P:   SUP: Pantoprazole.  Full diet   HEMATOLOGIC A:   Stable no issues Leukocytosis - steroids VTE Prophylaxis. P:  SCD's / lovenox CBC q am.   INFECTIOUS A:   Multifocal pneumonia ? Infectious vs inflammatory R/o aseptic endocarditis, autoimmune related, limpman sachs?, cancer? Febrile 2/17 P:   Abx per ID ID on board > Toxo immunoglobulin negative, cryptococcal ag negative.  CMV culture pending Histo ag neg.  CMV pcr negative. Fungal Ab negative Mycoplasma ab neg Chlamydia panel pending Legionella cx pending Pneumocystis smear pending PCT negative so far.   - infectious workup has been negative.  - no antibiotics at this time. No infectious etiology identified.   AUtoimmune A: r/o autoimmune process, doubt eosinophilc pna Dsdna neg, RA pos, CCP pos - favor RA  Solumedrol 40 TID Discussed with Rheum. Little likelihood of being related to rheumatoid.   ENDOCRINE A:   Stable, No issues  TSH > normal.  P:   SSI as needed   NEUROLOGIC A:  Stable, no issues Sedated on Vent P:   Sedation:  none fent RASS goal: 0   Family updated: family updated. .   Interdisciplinary Family Meeting v Palliative Care Meeting: NA  Summary - Favor autoimmune process vs. HOCM. Tenuous respiratory status. Somewhat improving. Continuing steroids. Will see what HC shows.   Paula Compton, MD Resident - PGY 2    03/29/2015 2:25 PM

## 2015-03-29 NOTE — Care Management Important Message (Signed)
Important Message  Patient Details  Name: Rachel Baker MRN: [id number] Date of Birth: Jun 13, 1950   Medicare Important Message Given:  Yes    Kyla Balzarine 03/29/2015, 1:09 PM

## 2015-03-29 NOTE — Progress Notes (Signed)
UR Completed. Clorinda Wyble, RN, BSN.  [phone] 

## 2015-03-29 NOTE — H&P (View-Only) (Signed)
   SUBJECTIVE: Breathing still not improved. No chest pain.   Tele:sinus  BP 153/59 mmHg  Pulse 54  Temp(Src) 97.6 F (36.4 C) (Oral)  Resp 20  Ht 5' 2" (1.575 m)  Wt 236 lb 1.8 oz (107.1 kg)  BMI 43.17 kg/m2  SpO2 96%  Intake/Output Summary (Last 24 hours) at 03/29/15 0938 Last data filed at 03/29/15 0600  Gross per 24 hour  Intake   1260 ml  Output   1675 ml  Net   -415 ml    PHYSICAL EXAM General: Well developed, well nourished, in no acute distress. Alert and oriented x 3.  Psych:  Good affect, responds appropriately Neck: No JVD. No masses noted.  Lungs: Clear bilaterally with no wheezes or rhonci noted.  Heart: RRR with no murmurs noted. Abdomen: Bowel sounds are present. Soft, non-tender.  Extremities: No lower extremity edema.   LABS: Basic Metabolic Panel:  Recent Labs  03/28/15 0400 03/29/15 0346  NA 143 139  K 5.2* 4.4  CL 105 104  CO2 27 28  GLUCOSE 125* 110*  BUN 47* 43*  CREATININE 0.89 0.75  CALCIUM 8.9 8.6*   CBC:  Recent Labs  03/28/15 0400 03/29/15 0346  WBC 17.4* 19.1*  HGB 13.8 13.4  HCT 44.0 42.6  MCV 92.1 93.0  PLT 530* 423*   Current Meds: . antiseptic oral rinse  7 mL Mouth Rinse BID  . aspirin  81 mg Per Tube Daily  . enoxaparin (LOVENOX) injection  40 mg Subcutaneous Q24H  . feeding supplement (ENSURE ENLIVE)  237 mL Oral BID BM  . furosemide  40 mg Intravenous Daily  . methylPREDNISolone (SOLU-MEDROL) injection  40 mg Intravenous 3 times per day  . metoprolol tartrate  50 mg Oral BID   Echo 03/16/15: Left ventricle: The cavity size was normal. Wall thickness was increased in a pattern of severe LVH. Systolic function was vigorous. The estimated ejection fraction was in the range of 65% to 70%. Wall motion was normal; there were no regional wall motion abnormalities. Doppler parameters are consistent with abnormal left ventricular relaxation (grade 1 diastolic dysfunction). Doppler parameters are  consistent with high ventricular filling pressure. - Aortic valve: Valve mobility was restricted. 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 - Mitral valve: Severely calcified annulus. The findings are consistent with mild stenosis. There was mild regurgitation. Valve area by pressure half-time: 1.96 cm^2. - Left atrium: The atrium was moderately dilated. - Pericardium, extracardiac: A trivial pericardial effusion was identified. Impressions: - Vigorous LV function; grade 1 diastolic dysfunction with elevated LV filling pressure; severe LVH; intracavitary gradient of 3.3 m/s; moderate LAE; severe MAC with mild MR and mild MS; moderate AS (mean gradient 20 mmHg).  TEE: 03/21/15: Left ventricle: Severe LVH with more significant focal basal septal hypertrophy suggestive of HOCM. There is subvalvular acceleration and outflow tract obstruction. The estimated ejection fraction was 75%. Wall motion was normal; there were no regional wall motion abnormalities. - Aortic valve: Trileaflet. There is a calcified nodule on the NCC. - Mitral valve: SAM, mild posteriorly directed regurgitation. Calcified mass on the subvalvular apparatus of the posterior leaflet. - Left atrium: The atrium was dilated. No evidence of thrombus in the atrial cavity or appendage. - Right atrium: No evidence of thrombus in the atrial cavity or appendage. - Atrial septum: No defect or patent foramen ovale was identified. - Tricuspid valve: No evidence of vegetation. - Pulmonic valve: No evidence of vegetation. Impressions: - No endocarditis. Calcified nodules on the NCC   of the aortic valve and mitral valve apparatus, with MAC. There is SAM of the anterior leaflet with mild, posteriorly directed MR. There is significant LVOT obstruction (see surface echo) with severe basal septal hypertrophy consistent with HOCM. No aortic stenosis is identified. EF 75%.   ASSESSMENT AND PLAN:  1.  Acute diastolic heart failure: She continues to diurese well with IV Lasix. Negative 400 cc last 24 hours and 12.3 liters since admission. Change to po Lasix today. Will plan right heart cath to assess pressures and left heart cath to exclude CAD (subtle elevation troponin). today. Plan around 1:30 pm. Working diagnoses by pulmonary team is that this is an inflammatory process vs auto-immunue process with overlying pulmonary edema. She is on steroids.   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 There are degenerative valvular changes but there is subvalvular obstruction by TEE. Continue beta blocker. Would avoid vasodilating drugs. If she develops hypotension she should be treated with a pure vasoconstrictor (phenylephrine). She should not be given positive inotropes (dopamine, norepinephrine) as these could worsen her hemodynamics.   3. Possible infectious endocarditis versus intracardiac mass: TEE shows an unusual hyperechoic nodule attached to the posterior mitral valve apparatus on the ventricular side. Possible that this could be the cardioembolic source for her finger lesion. It looks very unusual for a vegetation. Clinically this does not appear to be endocarditis. It could be an atypical myxoma or an unusually large papillary fibroelastoma. Would plan cardiac MRI next week when she is more stable to better define.     MCALHANY,CHRISTOPHER  2/24/20179:38 AM

## 2015-03-29 NOTE — Progress Notes (Signed)
Site area: Right groin a 5 french arterial and a 7 french venous sheaths were removed  Site Prior to Removal:  Level 0  Pressure Applied For 20 MINUTES    Minutes Beginning at 1440p  Manual:   Yes.    Patient Status During Pull:  stable  Post Pull Groin Site:  Level 0  Post Pull Instructions Given:  Yes.    Post Pull Pulses Present:  Yes.    Dressing Applied:  Yes.    Comments:  VS remain stable during sheath pull.

## 2015-03-29 NOTE — Progress Notes (Signed)
Physical Therapy Treatment Patient Details Name: Rachel Baker MRN: [id number] DOB: 11-03-1950 Today's Date: 03/29/2015    History of Present Illness pt presents with CAP and recent flu.  pt with hx of Carpal Tunnel Release and Cataract Extraction as only PMH.  On 2/15, pt began to develop worsening respiratory status with increasing O2 requirement, required transfer to the ICU, and intubation2/15-2/19. Bronchoscopy performed as well    PT Comments    Mobility limited due to respiratory status. Pt's 02 sats remain in low to mid 80s with minimal activity on NRB mask. Education re: pursed lip breathing and relaxation techniques to decrease anxiety. Tolerated SPT bed to/from [facility] with Min A for balance. Weakness noted in BLEs in standing. Deferred transfer to chair due to unstable respiratory status. Will continue to follow. Not able to safely increase activity until respiratory status improves.   Follow Up Recommendations  Home health PT;Supervision for mobility/OOB (pending progress)     Equipment Recommendations  None recommended by PT    Recommendations for Other Services       Precautions / Restrictions Precautions Precautions: Fall Precaution Comments: monitor 02 Restrictions Weight Bearing Restrictions: No    Mobility  Bed Mobility Overal bed mobility: Modified Independent Bed Mobility: Supine to Sit;Sit to Supine     Supine to sit: Supervision;HOB elevated Sit to supine: Supervision   General bed mobility comments: No physical assist needed but pt very anxious sitting EOB. Sp02 decreased to low 80s on NRB mask.  Transfers Overall transfer level: Needs assistance Equipment used: None Transfers: Sit to/from [employer] Sit to Stand: Min assist Stand pivot transfers: Min assist       General transfer comment: Min A to boost from EOB with cues for hand placement. SPT bed to/from [facility] with Min A for balance. Sp02 dropped to 78% on 100% Fi02 NRB.  Deferred transfer to chair due to low sats. Cues for pursed lip breathing.  Ambulation/Gait Ambulation/Gait assistance:  (deferred secondary to low sats on NRB mask)               Stairs            Wheelchair Mobility    Modified Rankin (Stroke Patients Only)       Balance Overall balance assessment: Needs assistance Sitting-balance support: Feet supported;No upper extremity supported Sitting balance-Leahy Scale: Good     Standing balance support: During functional activity Standing balance-Leahy Scale: Fair Standing balance comment: Requires UE support in standing for peri care today. Able to stand for ~30 seconds.                    Cognition Arousal/Alertness: Awake/alert Behavior During Therapy: Anxious Overall Cognitive Status: Within Functional Limits for tasks assessed                      Exercises      General Comments        Pertinent Vitals/Pain Pain Assessment: No/denies pain    Home Living                      Prior Function            PT Goals (current goals can now be found in the care plan section) Progress towards PT goals: Not progressing toward goals - comment (secondary to low 02 sats)    Frequency  Min 3X/week    PT Plan      Co-evaluation  End of Session Equipment Utilized During Treatment: Oxygen Activity Tolerance: Treatment limited secondary to medical complications (Comment) (drop in Sp02 to 78% on NRB mask.) Patient left: in bed;with call bell/phone within reach;with bed alarm set;with family/visitor present     Time: 0349-1791 PT Time Calculation (min) (ACUTE ONLY): 31 min  Charges:  $Therapeutic Activity: 23-37 mins                    G Codes:      Rachel Baker 03/29/2015, 10:23 AM Rachel Baker, PT, DPT [phone]

## 2015-03-29 NOTE — Interval H&P Note (Signed)
History and Physical Interval Note:  03/29/2015 1:15 PM  Rachel Baker  has presented today for cardiac cath with the diagnosis of elevated troponin, respiratory failure, chf.  The various methods of treatment have been discussed with the patient and family. After consideration of risks, benefits and other options for treatment, the patient has consented to  Procedure(s): Right/Left Heart Cath and Coronary Angiography (N/A) as a surgical intervention .  The patient's history has been reviewed, patient examined, no change in status, stable for surgery.  I have reviewed the patient's chart and labs.  Questions were answered to the patient's satisfaction.    Cath Lab Visit (complete for each Cath Lab visit)  Clinical Evaluation Leading to the Procedure:   ACS: Yes.    Non-ACS:    Anginal Classification: CCS III  Anti-ischemic medical therapy: No Therapy  Non-Invasive Test Results: No non-invasive testing performed  Prior CABG: No previous CABG         Andora Krull

## 2015-03-29 NOTE — Progress Notes (Signed)
SUBJECTIVE: Breathing still not improved. No chest pain.   Tele:sinus  BP 153/59 mmHg  Pulse 54  Temp(Src) 97.6 F (36.4 C) (Oral)  Resp 20  Ht 5\' 2"  (1.575 m)  Wt 236 lb 1.8 oz (107.1 kg)  BMI 43.17 kg/m2  SpO2 96%  Intake/Output Summary (Last 24 hours) at 03/29/15 03/31/15 Last data filed at 03/29/15 0600  Gross per 24 hour  Intake   1260 ml  Output   1675 ml  Net   -415 ml    PHYSICAL EXAM General: Well developed, well nourished, in no acute distress. Alert and oriented x 3.  Psych:  Good affect, responds appropriately Neck: No JVD. No masses noted.  Lungs: Clear bilaterally with no wheezes or rhonci noted.  Heart: RRR with no murmurs noted. Abdomen: Bowel sounds are present. Soft, non-tender.  Extremities: No lower extremity edema.   LABS: Basic Metabolic Panel:  Recent Labs  03/31/15 0400 03/29/15 0346  NA 143 139  K 5.2* 4.4  CL 105 104  CO2 27 28  GLUCOSE 125* 110*  BUN 47* 43*  CREATININE 0.89 0.75  CALCIUM 8.9 8.6*   CBC:  Recent Labs  03/28/15 0400 03/29/15 0346  WBC 17.4* 19.1*  HGB 13.8 13.4  HCT 44.0 42.6  MCV 92.1 93.0  PLT 530* 423*   Current Meds: . antiseptic oral rinse  7 mL Mouth Rinse BID  . aspirin  81 mg Per Tube Daily  . enoxaparin (LOVENOX) injection  40 mg Subcutaneous Q24H  . feeding supplement (ENSURE ENLIVE)  237 mL Oral BID BM  . furosemide  40 mg Intravenous Daily  . methylPREDNISolone (SOLU-MEDROL) injection  40 mg Intravenous 3 times per day  . metoprolol tartrate  50 mg Oral BID   Echo 03/16/15: Left ventricle: The cavity size was normal. Wall thickness was increased in a pattern of severe LVH. Systolic function was vigorous. The estimated ejection fraction was in the range of 65% to 70%. Wall motion was normal; there were no regional wall motion abnormalities. Doppler parameters are consistent with abnormal left ventricular relaxation (grade 1 diastolic dysfunction). Doppler parameters are  consistent with high ventricular filling pressure. - Aortic valve: Valve mobility was restricted. 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 - Mitral valve: Severely calcified annulus. The findings are consistent with mild stenosis. There was mild regurgitation. Valve area by pressure half-time: 1.96 cm^2. - Left atrium: The atrium was moderately dilated. - Pericardium, extracardiac: A trivial pericardial effusion was identified. Impressions: - Vigorous LV function; grade 1 diastolic dysfunction with elevated LV filling pressure; severe LVH; intracavitary gradient of 3.3 m/s; moderate LAE; severe MAC with mild MR and mild MS; moderate AS (mean gradient 20 mmHg).  TEE: 03/21/15: Left ventricle: Severe LVH with more significant focal basal septal hypertrophy suggestive of HOCM. There is subvalvular acceleration and outflow tract obstruction. The estimated ejection fraction was 75%. Wall motion was normal; there were no regional wall motion abnormalities. - Aortic valve: Trileaflet. There is a calcified nodule on the NCC. - Mitral valve: SAM, mild posteriorly directed regurgitation. Calcified mass on the subvalvular apparatus of the posterior leaflet. - Left atrium: The atrium was dilated. No evidence of thrombus in the atrial cavity or appendage. - Right atrium: No evidence of thrombus in the atrial cavity or appendage. - Atrial septum: No defect or patent foramen ovale was identified. - Tricuspid valve: No evidence of vegetation. - Pulmonic valve: No evidence of vegetation. Impressions: - No endocarditis. Calcified nodules on the [facility]  of the aortic valve and mitral valve apparatus, with MAC. There is SAM of the anterior leaflet with mild, posteriorly directed MR. There is significant LVOT obstruction (see surface echo) with severe basal septal hypertrophy consistent with HOCM. No aortic stenosis is identified. EF 75%.   ASSESSMENT AND PLAN:  1.  Acute diastolic heart failure: She continues to diurese well with IV Lasix. Negative 400 cc last 24 hours and 12.3 liters since admission. Change to po Lasix today. Will plan right heart cath to assess pressures and left heart cath to exclude CAD (subtle elevation troponin). today. Plan around 1:30 pm. Working diagnoses by pulmonary team is that this is an inflammatory process vs auto-immunue process with overlying pulmonary edema. She is on steroids.   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 There are degenerative valvular changes but there is subvalvular obstruction by TEE. Continue beta blocker. Would avoid vasodilating drugs. If she develops hypotension she should be treated with a pure vasoconstrictor (phenylephrine). She should not be given positive inotropes (dopamine, norepinephrine) as these could worsen her hemodynamics.   3. Possible infectious endocarditis versus intracardiac mass: TEE shows an unusual hyperechoic nodule attached to the posterior mitral valve apparatus on the ventricular side. Possible that this could be the cardioembolic source for her finger lesion. It looks very unusual for a vegetation. Clinically this does not appear to be endocarditis. It could be an atypical myxoma or an unusually large papillary fibroelastoma. Would plan cardiac MRI next week when she is more stable to better define.     MCALHANY,CHRISTOPHER  2/24/20179:38 AM

## 2015-03-30 LAB — BASIC METABOLIC PANEL
Anion gap: 5 (ref 5–15)
BUN: 35 mg/dL — AB (ref 4–21)
BUN: 35 mg/dL — ABNORMAL HIGH (ref 6–20)
CALCIUM: 8.5 mg/dL — AB (ref 8.9–10.3)
CO2: 26 mmol/L (ref 22–32)
CREATININE: 0.71 mg/dL (ref 0.44–1.00)
Chloride: 105 mmol/L (ref 101–111)
Creatinine: 0.7 mg/dL (ref 0.5–1.1)
Glucose, Bld: 103 mg/dL — ABNORMAL HIGH (ref 65–99)
Glucose: 103 mg/dL
Potassium: 4.6 mmol/L (ref 3.5–5.1)
SODIUM: 136 mmol/L — AB (ref 137–147)
SODIUM: 136 mmol/L (ref 135–145)

## 2015-03-30 LAB — CBC
HCT: 44.5 % (ref 36.0–46.0)
HEMOGLOBIN: 14 g/dL (ref 12.0–15.0)
MCH: 29 pg (ref 26.0–34.0)
MCHC: 31.5 g/dL (ref 30.0–36.0)
MCV: 92.3 fL (ref 78.0–100.0)
Platelets: 410 10*3/uL — ABNORMAL HIGH (ref 150–400)
RBC: 4.82 MIL/uL (ref 3.87–5.11)
RDW: 14.1 % (ref 11.5–15.5)
WBC: 17.3 10*3/uL — ABNORMAL HIGH (ref 4.0–10.5)

## 2015-03-30 MED ORDER — ALUM & MAG HYDROXIDE-SIMETH 200-200-20 MG/5ML PO SUSP
15.0000 mL | ORAL | Status: DC | PRN
Start: 2015-03-30 — End: 2015-04-04
  Administered 2015-03-30 – 2015-04-03 (×5): 15 mL via ORAL
  Filled 2015-03-30 (×5): qty 30

## 2015-03-30 MED ORDER — PANTOPRAZOLE SODIUM 40 MG PO TBEC
40.0000 mg | DELAYED_RELEASE_TABLET | Freq: Two times a day (BID) | ORAL | Status: DC
  Administered 2015-03-31 – 2015-04-04 (×9): 40 mg via ORAL
  Filled 2015-03-30 (×9): qty 1

## 2015-03-30 MED ORDER — ALPRAZOLAM 0.5 MG PO TABS
0.5000 mg | ORAL_TABLET | Freq: Four times a day (QID) | ORAL | Status: DC | PRN
  Filled 2015-03-30: qty 1

## 2015-03-30 NOTE — Progress Notes (Signed)
PULMONARY / CRITICAL CARE MEDICINE   Name: Rachel Baker MRN: [id number] DOB: 09/24/1950    ADMISSION DATE:  03/15/2015  CONSULTATION DATE:  03/20/2015  REFERRING MD:  Jonetta Osgood, MD  CHIEF COMPLAINT:  Shortness of breath.   HISTORY OF PRESENT ILLNESS:  Rachel Baker is a 65 y.o. female with PMH as outlined below. She initially presented with 1-2 days of a flu-like illness with progressive shortness of breath, generalized weakness, fatigue, associated with diarrhea. No fever, chills, LE edema, chest pain, nausea or vomiting. She had progressive SOB, was evaluated in the ED with CXR concerning for multifocal pneumonia, and troponin elevation. She was admitted to the hospital on 03/14/2014 and further workup was concerning for heart failure. She was found to have Mitral stenosis and Aortic stenosis. BNP was very low. She underwent diuresis with little improvement in her symptoms, and her respiratory status actually has become worse over time. Was also treated for pneumonia, but CXR continued to demonstrate no resolution. She developed a purple lesion on the tip of her right long finger with concern for embolic source. Additionally, with worsening CXR and respiratory status in spite of treatment, autoimmune workup was also performed which has been negative except for mildly elevated RF.   On 2/15 - she began to develop worsening respiratory status with increasing O2 requirement. She required transfer to the ICU, and intubation. Bronchoscopy performed as well. Has since had slow improvement.   SUBJECTIVE:  Feeling better, wants to try NP   VITAL SIGNS: BP 146/62 mmHg  Pulse 55  Temp(Src) 98.4 F (36.9 C) (Oral)  Resp 21  Ht _0  (1.6 m)  Wt 233 lb 7.5 oz (105.9 kg)  BMI 41.37 kg/m2  SpO2 94%   INTAKE / OUTPUT: I/O last 3 completed shifts: In: 1880 [P.O.:1680; I.V.:200] Out: 1600 [Urine:1600]    Intake/Output Summary (Last 24 hours) at 03/30/15 1550 Last data filed at  03/30/15 1300  Gross per 24 hour  Intake   1470 ml  Output    925 ml  Net    545 ml      PHYSICAL EXAMINATION: General: resting comfortably in bed.  Neuro: AAOx3, conversant.    HEENT: [place]/AT. PERRL Cardiovascular: RRR, harsh systolic murmur remains, No gallops or rubs, S1/S2.  Lungs: classic bronchovesicular changes bilaterally  Abdomen: BS x 4, soft, NT/ND.  Musculoskeletal: No gross deformities, no edema.  Skin: Intact, warm, no rashes. Noted Blue discoloration of right long finger improving this am.   LABS:  BMET  Recent Labs Lab 03/28/15 0400 03/29/15 0346 03/30/15 0250  NA 143 139 136  K 5.2* 4.4 4.6  CL 105 104 105  CO2 _1 BUN 47* 43* 35*  CREATININE 0.89 0.75 0.71  GLUCOSE 125* 110* 103*    Electrolytes  Recent Labs Lab 03/28/15 0400 03/29/15 0346 03/30/15 0250  CALCIUM 8.9 8.6* 8.5*    CBC  Recent Labs Lab 03/28/15 0400 03/29/15 0346 03/30/15 0250  WBC 17.4* 19.1* 17.3*  HGB 13.8 13.4 14.0  HCT 44.0 42.6 44.5  PLT 530* 423* 410*    Coag's No results for input(s): APTT, INR in the last 168 hours.  Sepsis Markers No results for input(s): LATICACIDVEN, PROCALCITON, O2SATVEN in the last 168 hours.  ABG  Recent Labs Lab 03/29/15 1355  PHART 7.445  PCO2ART 31.8*  PO2ART 70.0*    Liver Enzymes No results for input(s): AST, ALT, ALKPHOS, BILITOT, ALBUMIN in the last 168 hours.  Cardiac Enzymes  No results for input(s): TROPONINI, PROBNP in the last 168 hours.  Glucose  Recent Labs Lab 03/24/15 1740 03/24/15 1921 03/24/15 2343 03/25/15 0335 03/25/15 0829 03/25/15 1241  GLUCAP 107* 117* 127* 101* 124* 114*    Imaging No results found.   STUDIES:  CXR 2/15 > ?Multifocal pneumonia worsening  Echo > LV EF 65-70%, G1DD, Moderate AS, Calcified Mitral Annulus mild stenosis / mild regurg, LA mod dialation, trivial pericardial effusion.  RUE U/S > Negative TEE 2/16 > Nodules, HOCM, No endocarditis visible.  Ct chest  2/17 >> Extensive ground-glass attenuation airspace opacity and airspace consolidation involving all lobes of both lungs but predominantly the dependent portions of the lower lobes CXR 2/20 > unchanged  CXR 2/23 > improving ?edema.   CULTURES: BAL Culture 2/15 > ng BAL CMV 2/16 > ng Pneumocystis Smear DFA 2/15 > neg Legionella Culture 2/15 >  Fungus Culture with Smear 2/15 > no yeast or fungus seen.  Blood Culture 2/10 > Neg RVP 2/11 > Negative  ANTIBIOTICS: Levaquin 2/10 - 2/14   SIGNIFICANT EVENTS: 2/10 > Admitted concern multifocal pneumonia vs. CHF exacerbation 2/13- 14 > little response to diuresis 2/15 > Progressive respiratory failure requiring intubation, bronchoscopy performed 2/16 > TEE not suggestive of endocarditis, has nodules on Mitral and aortic valves with possible embolization 2/24  LHC/ RHC  1. Mild non-obstructive CAD 2. Normal wedge pressure 3. Mean PA pressure 39mHg  LINES/TUBES: CVL 2/15 > Foley 2/15 > ETT 2/15 > 2/19    ASSESSMENT / PLAN:  PULMONARY A: Acute Hypoxic Respiratory Failure  Multifocal Pneumonia vs. Pulmonary Edema vs. Autoimmune Process.  Atypical Pneumonia - ? Mycobacterium, ? Fungal  ? PE - at risk (unlikely with infiltrates) ESR 96 03/20/15 P:   Extubated Slowly improved respiratory function.  Decreased steroids to 40 TID continue Concern for pulmonary edema > watching fluid balance. Discontinue lasix for now. Negative balance achieved.   CARDIOVASCULAR A:  Hypertension - severe range pressures post - CVL Resolved.  Mitral Stenosis Acute diastolic HF - not responding to diuresis.  Aortic Stenosis - HOCM on TEE TEE > Mitral and Aortic valve nodules  P:  Lopressor to keep HR low for preserved physiology goal 60 Labetalol prn HTN.   Diuresis with some improvement in symptoms. Holding lasix now.   RENAL A:   Diuresis for HF as above, over diruesis likely Mild AKI - resolved Hyperphosphatemia - resolved P:    Trending fluid balance,     GASTROINTESTINAL A:   GI prophylaxis. Nutrition.  P:   SUP: Pantoprazole.  Full diet   HEMATOLOGIC A:   Stable no issues Leukocytosis - steroids VTE Prophylaxis. P:  SCD's / lovenox CBC q am.   INFECTIOUS A:   Multifocal pneumonia ? Infectious vs inflammatory R/o aseptic endocarditis, autoimmune related, limpman sachs?, cancer? Febrile 2/17 P:   Abx per ID ID on board > Toxo immunoglobulin negative, cryptococcal ag negative.  CMV culture pending Histo ag neg.  CMV pcr negative. Fungal Ab negative Mycoplasma ab neg Chlamydia panel pending Legionella cx pending Pneumocystis smear pending PCT negative so far.   - infectious workup has been negative.  - no antibiotics at this time. No infectious etiology identified.   AUtoimmune A: r/o autoimmune process, doubt eosinophilc pna Dsdna neg, RA pos, CCP pos - favor RA  Solumedrol 40 TID Discussed with Rheum. Little likelihood of being related to rheumatoid.   ENDOCRINE A:   Stable, No issues  TSH > normal.  P:  SSI as needed   Continue to wean 02/ steroids as tolerated  Christinia Gully, MD Pulmonary and [place] [phone] After 5:30 PM or weekends, call [phone]

## 2015-03-30 NOTE — Progress Notes (Signed)
Cardiac cath with nonobstructive CAD.  PCWP normal.  Continue current management.  If hypotensive, avoid inotrope, would use pure vasoconstrictors.

## 2015-03-30 NOTE — Progress Notes (Signed)
eLink Physician-Brief Progress Note Patient Name: Rachel Baker DOB: 07/08/1950 MRN: [id number]   Date of Service  03/30/2015  HPI/Events of Note  Requesting reflux rx   eICU Interventions  Add ppi bid and prn maalox plus      Intervention Category Minor Interventions: Routine modifications to care plan (e.g. PRN medications for pain, fever)  Sandrea Hughs 03/30/2015, 8:52 PM

## 2015-03-31 MED ORDER — PREDNISONE 20 MG PO TABS
40.0000 mg | ORAL_TABLET | Freq: Two times a day (BID) | ORAL | Status: DC
  Administered 2015-03-31 – 2015-04-03 (×6): 40 mg via ORAL
  Filled 2015-03-31 (×6): qty 2

## 2015-03-31 NOTE — Progress Notes (Signed)
Dr.Wert was notified earlier during the night patient post cath from 03/29/15 and has level 2 hematoma to upper right side of site pressure dsg applied tender to touch.

## 2015-03-31 NOTE — Progress Notes (Signed)
PULMONARY / CRITICAL CARE MEDICINE   Name: Rachel Baker MRN: [id number] DOB: [birth year]    ADMISSION DATE:  03/15/2015  CONSULTATION DATE:  03/20/2015  REFERRING MD:  Jonetta Osgood, MD  CHIEF COMPLAINT:  Shortness of breath.   HISTORY OF PRESENT ILLNESS:  Rachel Baker is a 65 y.o. female with PMH as outlined below. She initially presented with 1-2 days of a flu-like illness with progressive shortness of breath, generalized weakness, fatigue, associated with diarrhea. No fever, chills, LE edema, chest pain, nausea or vomiting. She had progressive SOB, was evaluated in the ED with CXR concerning for multifocal pneumonia, and troponin elevation. She was admitted to the hospital on 03/14/2014 and further workup was concerning for heart failure. She was found to have Mitral stenosis and Aortic stenosis. BNP was very low. She underwent diuresis with little improvement in her symptoms, and her respiratory status actually has become worse over time. Was also treated for pneumonia, but CXR continued to demonstrate no resolution. She developed a purple lesion on the tip of her right long finger with concern for embolic source. Additionally, with worsening CXR and respiratory status in spite of treatment, autoimmune workup was also performed which has been negative except for mildly elevated RF.   On 2/15 - she began to develop worsening respiratory status with increasing O2 requirement. She required transfer to the ICU, and intubation. Bronchoscopy performed as well. Has since had slow improvement.   SUBJECTIVE:  Feeling better, sitting in chair nad   VITAL SIGNS: BP 126/59 mmHg  Pulse 61  Temp(Src) 97.8 F (36.6 C) (Axillary)  Resp 24  Ht 5' 3"  (1.6 m)  Wt 228 lb 13.4 oz (103.8 kg)  BMI 40.55 kg/m2  SpO2 95%  02 4lpm NP   INTAKE / OUTPUT: I/O last 3 completed shifts: In: 1200 [P.O.:1200] Out: 7366 [Urine:1375]    Intake/Output Summary (Last 24 hours) at 03/31/15 1031 Last  data filed at 03/31/15 0900  Gross per 24 hour  Intake    720 ml  Output    925 ml  Net   -205 ml      PHYSICAL EXAMINATION: General: up in chair/ nad  Neuro: AAOx3, conversant.    HEENT: [place]/AT. PERRL Cardiovascular: RRR, harsh systolic murmur remains, No gallops or rubs, S1/S2.  Lungs:   bronchovesicular changes bilaterally less prominent  Abdomen: BS x 4, soft, NT/ND.  Musculoskeletal: No gross deformities, no edema.  Skin: Intact, warm, no rashes. Noted Blue discoloration of right long finger improving this am.   LABS:  BMET  Recent Labs Lab 03/28/15 0400 03/29/15 0346 03/30/15 0250  NA 143 139 136  K 5.2* 4.4 4.6  CL 105 104 105  CO2 27 28 26   BUN 47* 43* 35*  CREATININE 0.89 0.75 0.71  GLUCOSE 125* 110* 103*    Electrolytes  Recent Labs Lab 03/28/15 0400 03/29/15 0346 03/30/15 0250  CALCIUM 8.9 8.6* 8.5*    CBC  Recent Labs Lab 03/28/15 0400 03/29/15 0346 03/30/15 0250  WBC 17.4* 19.1* 17.3*  HGB 13.8 13.4 14.0  HCT 44.0 42.6 44.5  PLT 530* 423* 410*    Coag's No results for input(s): APTT, INR in the last 168 hours.  Sepsis Markers No results for input(s): LATICACIDVEN, PROCALCITON, O2SATVEN in the last 168 hours.  ABG  Recent Labs Lab 03/29/15 1355  PHART 7.445  PCO2ART 31.8*  PO2ART 70.0*    Liver Enzymes No results for input(s): AST, ALT, ALKPHOS, BILITOT, ALBUMIN in the  last 168 hours.  Cardiac Enzymes No results for input(s): TROPONINI, PROBNP in the last 168 hours.  Glucose  Recent Labs Lab 03/24/15 1740 03/24/15 1921 03/24/15 2343 03/25/15 0335 03/25/15 0829 03/25/15 1241  GLUCAP 107* 117* 127* 101* 124* 114*    Imaging No results found.   STUDIES:  CXR 2/15 > ?Multifocal pneumonia worsening  Echo > LV EF 65-70%, G1DD, Moderate AS, Calcified Mitral Annulus mild stenosis / mild regurg, LA mod dialation, trivial pericardial effusion.  RUE U/S > Negative TEE 2/16 > Nodules, HOCM, No endocarditis visible.   Ct chest 2/17 >> Extensive ground-glass attenuation airspace opacity and airspace consolidation involving all lobes of both lungs but predominantly the dependent portions of the lower lobes CXR 2/20 > unchanged  CXR 2/23 > improving ?edema.   CULTURES: BAL Culture 2/15 > ng BAL CMV 2/16 > ng Pneumocystis Smear DFA 2/15 > neg Legionella Culture 2/15 >  Fungus Culture with Smear 2/15 > no yeast or fungus seen.  Blood Culture 2/10 > Neg RVP 2/11 > Negative  ANTIBIOTICS: Levaquin 2/10 - 2/14   SIGNIFICANT EVENTS: 2/10 > Admitted concern multifocal pneumonia vs. CHF exacerbation 2/13- 14 > little response to diuresis 2/15 > Progressive respiratory failure requiring intubation, bronchoscopy performed 2/16 > TEE not suggestive of endocarditis, has nodules on Mitral and aortic valves with possible embolization 2/24  LHC/ RHC  1. Mild non-obstructive CAD 2. Normal wedge pressure 3. Mean PA pressure 96mHg  LINES/TUBES: CVL 2/15 > Foley 2/15 > ETT 2/15 > 2/19    ASSESSMENT / PLAN:  PULMONARY A: Acute Hypoxic Respiratory Failure  Multifocal Pneumonia vs. Pulmonary Edema vs. Autoimmune Process.  Atypical Pneumonia - ? Mycobacterium, ? Fungal  ? PE - at risk (unlikely with infiltrates) ESR 96 03/20/15 P:   Extubated Slowly improved respiratory function.  Decreased steroids to 40 TID continue Concern for pulmonary edema >  Discontinued lasix 2/25 w. Negative balance achieved and nl PCPW noted  CARDIOVASCULAR A:  Hypertension - severe range pressures post - CVL Resolved.  Mitral Stenosis Acute diastolic HF - not responding to diuresis.  Aortic Stenosis - HOCM on TEE TEE > Mitral and Aortic valve nodules LHC/RHC 2/24as above   P:  Lopressor to keep HR low for preserved physiology goal 60 Labetalol prn HTN.      RENAL A:   Diuresis for HF as above, over diruesis likely Mild AKI - resolved Hyperphosphatemia - resolved P:   Trending fluid balance,      GASTROINTESTINAL A:   GI prophylaxis. Nutrition.  P:   SUP: Pantoprazole.  Full diet   HEMATOLOGIC A:   Stable no issues Leukocytosis - steroids VTE Prophylaxis. P:  SCD's / lovenox CBC q am.   INFECTIOUS A:   Multifocal pneumonia ? Infectious vs inflammatory R/o aseptic endocarditis, autoimmune related, limpman sachs?, cancer? Febrile 2/17 P:   Abx per ID ID on board > Toxo immunoglobulin negative, cryptococcal ag negative.  CMV culture pending Histo ag neg.  CMV pcr negative. Fungal Ab negative Mycoplasma ab neg Chlamydia panel pending Legionella cx pending Pneumocystis smear pending PCT negative so far.   - infectious workup has been negative.  - no antibiotics at this time. No infectious etiology identified.   AUtoimmune A: r/o autoimmune process, doubt eosinophilc pna Dsdna neg, RA pos, CCP pos - favor RA  Solumedrol 40 TID> changed to po rx 2/26  Discussed with Rheum. Little likelihood of being related to rheumatoid.   ENDOCRINE A:   Stable,  No issues  TSH > normal.  P:   SSI as needed   Continue to wean 02/ steroids as tolerated/ mobilize/ IS > ok for floor   Christinia Gully, MD Pulmonary and [place] [phone] After 5:30 PM or weekends, call [phone]

## 2015-04-01 ENCOUNTER — Encounter (HOSPITAL_COMMUNITY): Payer: Self-pay | Admitting: Cardiovascular Disease

## 2015-04-01 MED FILL — Heparin Sodium (Porcine) 2 Unit/ML in Sodium Chloride 0.9%: INTRAMUSCULAR | Qty: 1500 | Status: AC

## 2015-04-01 NOTE — Progress Notes (Signed)
Physical Therapy Treatment Patient Details Name: Rachel Baker MRN: [id number] DOB: 11-15-1950 Today's Date: 04/01/2015    History of Present Illness pt presents with CAP and recent flu.  pt with hx of Carpal Tunnel Release and Cataract Extraction as only PMH.  On 2/15, pt began to develop worsening respiratory status with increasing O2 requirement, required transfer to the ICU, and intubation2/15-2/19. Bronchoscopy performed as well    PT Comments    Ms. Laurette Schimke was very anxious and agitated throughout session and SpO2 down as low as 80% on 8L O2 w/ short distance ambulation in room.  Educated pt on technique of and purpose of pursed lip breathing which she, at times, refused to perform.  Pt requires min assist w/ transfers and ambulation.  Updated d/c recommendations based on pt's currently limited mobility status.   Follow Up Recommendations  SNF;Supervision for mobility/OOB (unless improvement w/ mobility)     Equipment Recommendations  None recommended by PT    Recommendations for Other Services       Precautions / Restrictions Precautions Precautions: Fall Precaution Comments: monitor 02; pt becomes anxious/agitated w/ SOB Restrictions Weight Bearing Restrictions: No    Mobility  Bed Mobility Overal bed mobility: Needs Assistance Bed Mobility: Supine to Sit;Sit to Supine     Supine to sit: Supervision;HOB elevated Sit to supine: Supervision   General bed mobility comments: No physical assist needed but pt anxious sitting EOB. Sp02 decreased to 80% on 6L O2.  Transfers Overall transfer level: Needs assistance Equipment used: None;Rolling walker (2 wheeled) Transfers: Sit to/from [employer] Sit to Stand: Min assist Stand pivot transfers: Min assist       General transfer comment: Min assist to steady during sit<>stand and stand pivot as pt unsteady w/ trunk flexed.  Pt becomes frustrated and pushing RW away and refusing to use during stand  pivot back to bed.  Ambulation/Gait Ambulation/Gait assistance: Min assist;+2 safety/equipment Ambulation Distance (Feet): 10 Feet Assistive device: Rolling walker (2 wheeled) Gait Pattern/deviations: Decreased stride length;Antalgic;Trunk flexed;Wide base of support   Gait velocity interpretation: <1.8 ft/sec, indicative of risk for recurrent falls General Gait Details: SpO2 as low as 80% on 8L O2 while ambulating in room around to other side of bed.  Cues for upright posture and pursed lip breathing.  Pt demonstrates flexed posture which she refuses to correct.   Stairs            Wheelchair Mobility    Modified Rankin (Stroke Patients Only)       Balance Overall balance assessment: Needs assistance Sitting-balance support: Feet supported;No upper extremity supported Sitting balance-Leahy Scale: Good Sitting balance - Comments: Pt restless sitting EOB due to SOB   Standing balance support: Bilateral upper extremity supported;During functional activity Standing balance-Leahy Scale: Fair Standing balance comment: RW and min assist for support during transfers                    Cognition Arousal/Alertness: Awake/alert Behavior During Therapy: Anxious Overall Cognitive Status: Within Functional Limits for tasks assessed                      Exercises      General Comments General comments (skin integrity, edema, etc.): Pt very agitated this session, limiting her ability to participate in a calm manner.  When educating pt on importance of pursed lip breathing and technique she begins to shout at the therapist saying, "I can't do it!", "That doesn't work for  me, stop telling me what to do!".  At end of session pt is apologetic and thanks therapist.      Pertinent Vitals/Pain Pain Assessment: No/denies pain    Home Living                      Prior Function            PT Goals (current goals can now be found in the care plan section)  Acute Rehab PT Goals Patient Stated Goal: to go home, to be able to catch her breath, to have lines disconnected PT Goal Formulation: With patient Time For Goal Achievement: 04/08/15 Potential to Achieve Goals: Fair Progress towards PT goals: Not progressing toward goals - comment (limited by agitation and SpO2)    Frequency  Min 3X/week    PT Plan Discharge plan needs to be updated    Co-evaluation             End of Session Equipment Utilized During Treatment: Oxygen;Gait belt (pt refuses gait belt w/ ambulation in room) Activity Tolerance: Treatment limited secondary to agitation;Treatment limited secondary to medical complications (Comment) (SpO2 down to 80% on 8L O2) Patient left: in bed;with call bell/phone within reach;with bed alarm set     Time: 1497-0263 PT Time Calculation (min) (ACUTE ONLY): 26 min  Charges:  $Therapeutic Activity: 23-37 mins                    G Codes:      Michail Jewels PT, DPT [phone] Pager: [phone] 04/01/2015, 2:44 PM

## 2015-04-01 NOTE — Progress Notes (Signed)
Nutrition Follow-up  DOCUMENTATION CODES:   Morbid obesity  INTERVENTION:    D/C Ensure Enlive supplements, intake of meals is adequate.  NUTRITION DIAGNOSIS:   Inadequate oral intake related to inability to eat as evidenced by NPO status.  Resolved. No new nutrition dx at this time.  GOAL:   Patient will meet greater than or equal to 90% of their needs  Met.  MONITOR:   PO intake, Labs, Weight trends  ASSESSMENT:   65 y.o. female who initially presented with 1-2 days of a flu-like illness with progressive shortness of breath, generalized weakness, fatigue, associated with diarrhea. CXR in the ED was concerning for multifocal pneumonia. She was admitted to the hospital on 03/14/2014 and further workup was concerning for heart failure. She was found to have Mitral stenosis and Aortic stenosis. She underwent diuresis with little improvement in her symptoms, and her respiratory status actually has become worse over time. Was also treated for pneumonia, but CXR continued to demonstrate no resolution. Required intubation and transfer to the ICU on 2/15.  Patient has been extubated. Diet advanced to regular with Ensure Enlive BID between meals. Patient is consuming 90-100% of meals. She no longer requires a PO supplement. PO intake of meals is adequate.   Diet Order:  Diet regular Room service appropriate?: Yes; Fluid consistency:: Thin  Skin:  Reviewed, no issues  Last BM:  2/26  Height:   Ht Readings from Last 1 Encounters:  03/29/15 _0  (1.6 m)    Weight:   Wt Readings from Last 1 Encounters:  04/01/15 231 lb 11.3 oz (105.1 kg)    Ideal Body Weight:  50 kg  BMI:  Body mass index is 41.05 kg/(m^2).  Estimated Nutritional Needs:   Kcal:  0165-8006  Protein:  100-110 gm  Fluid:  1.6-1.8 L  EDUCATION NEEDS:   No education needs identified at this time   Molli Barrows, [place], [place], [place] Pager [phone] After Hours Pager [phone]

## 2015-04-01 NOTE — Care Management Note (Addendum)
Case Management Note  Patient Details  Name: Rachel Baker MRN: [id number] Date of Birth: 11-23-1950  Subjective/Objective:     Date: 04/01/15 Spoke with patient at the bedside.  Introduced self as Sports coach and explained role in discharge planning and how to be reached.  Verified patient lives in town, with sister and brother n Social worker. Expressed potential need for Barriatric drop arm commode. Verified patient anticipates to go home with family, at time of discharge and will have full-time supervision by family at this time to best of their knowledge. Patient denied needing help with their medication.  Patient  is driven by family  to MD appointments.  Verified patient has PCP Tyson Dense. Patient chose [facility] for [facility] and HHPT and Bariatric drop arm commode.   Referral made to Tiffany and Jermaine with [facility] .  Soc will begin 24-48 hrs post dc. Patient also would like to know if she has a co pay,  Elmarie Shiley will check on this and get back with me. Patient has a co pay of $40 per visit for HHPT, HHRN there is no co pay.  For DME she has to pay 20%.   Per pt eval rec SNF, per CSW note patient refusing, she wants [facility] services.    Plan: CM will continue to follow for discharge planning and [facility] resources.                Action/Plan:   Expected Discharge Date:                  Expected Discharge Plan:  Home w Home Health Services  In-House Referral:     Discharge planning Services  CM Consult  Post Acute Care Choice:  Durable Medical Equipment, Home Health Choice offered to:  Patient  DME Arranged:  Bedside commode DME Agency:  [facility]  HH Arranged:  RN, PT [facility] Agency:  [facility]  Status of Service:  In process, will continue to follow  Medicare Important Message Given:  Yes Date Medicare IM Given:    Medicare IM give by:    Date Additional Medicare IM Given:    Additional Medicare Important Message give by:     If discussed at Long Length of Stay  Meetings, dates discussed:    Additional Comments:  Leone Haven, RN 04/01/2015, 12:50 PM

## 2015-04-01 NOTE — Progress Notes (Signed)
PULMONARY / CRITICAL CARE MEDICINE   Name: Rachel Baker MRN: [id number] DOB: 1950/06/25    ADMISSION DATE:  03/15/2015  CONSULTATION DATE:  03/20/2015  REFERRING MD:  Jonetta Osgood, MD  CHIEF COMPLAINT:  Shortness of breath.   HISTORY OF PRESENT ILLNESS:  Rachel Baker is a 65 y.o. female initially presented with 1-2 days of a flu-like illness with progressive shortness of breath, generalized weakness, fatigue, associated with diarrhea. , was evaluated in the ED with CXR concerning for multifocal pneumonia, and troponin elevation. She was admitted to the hospital on 03/14/2014 Echo showed HOCM pysiology She developed a purple lesion on the tip of her right long finger with concern for embolic source. Additionally, with worsening CXR and respiratory status in spite of treatment, autoimmune workup was also performed which has been negative except for mildly elevated RF.   On 2/15 - she began to develop worsening respiratory status with increasing O2 requirement. She required transfer to the ICU, and intubation. Bronchoscopy performed as well. Has since had slow improvement on empiric steroids  SIGNIFICANT EVENTS: 2/10 > Admitted concern multifocal pneumonia vs. CHF exacerbation 2/13- 14 > little response to diuresis 2/15 > Progressive respiratory failure requiring intubation, bronchoscopy performed 2/16 > TEE not suggestive of endocarditis, has nodules on Mitral and aortic valves with possible embolization 2/24  LHC/ RHC  1. Mild non-obstructive CAD 2. Normal wedge pressure 3. Mean PA pressure 38mHg  SUBJECTIVE:  Feeling better, sitting in bed Afebrile On 6L [place]  VITAL SIGNS: BP 146/56 mmHg  Pulse 53  Temp(Src) 97.6 F (36.4 C) (Oral)  Resp 20  Ht 5' 3"  (1.6 m)  Wt 105.1 kg (231 lb 11.3 oz)  BMI 41.05 kg/m2  SpO2 97%  02 4lpm NP   INTAKE / OUTPUT: I/O last 3 completed shifts: In: 1200 [P.O.:1200] Out: 1725 [Urine:1725]    Intake/Output Summary (Last 24  hours) at 04/01/15 0850 Last data filed at 04/01/15 00175 Gross per 24 hour  Intake    960 ml  Output   1100 ml  Net   -140 ml      PHYSICAL EXAMINATION: General: up in chair/ nad  Neuro: AAOx3, conversant.    HEENT: [place]/AT. PERRL Cardiovascular: RRR, harsh systolic murmur remains, No gallops or rubs, S1/S2.  Lungs:   Crackles rt base, clear left Abdomen: BS x 4, soft, NT/ND.  Musculoskeletal: No gross deformities, no edema.  Skin: Intact, warm, no rashes. Noted Blue discoloration of right long finger improving this am.   LABS:  BMET  Recent Labs Lab 03/28/15 0400 03/29/15 0346 03/30/15 0250  NA 143 139 136  K 5.2* 4.4 4.6  CL 105 104 105  CO2 27 28 26   BUN 47* 43* 35*  CREATININE 0.89 0.75 0.71  GLUCOSE 125* 110* 103*    Electrolytes  Recent Labs Lab 03/28/15 0400 03/29/15 0346 03/30/15 0250  CALCIUM 8.9 8.6* 8.5*    CBC  Recent Labs Lab 03/28/15 0400 03/29/15 0346 03/30/15 0250  WBC 17.4* 19.1* 17.3*  HGB 13.8 13.4 14.0  HCT 44.0 42.6 44.5  PLT 530* 423* 410*    Coag's No results for input(s): APTT, INR in the last 168 hours.  Sepsis Markers No results for input(s): LATICACIDVEN, PROCALCITON, O2SATVEN in the last 168 hours.  ABG  Recent Labs Lab 03/29/15 1355  PHART 7.445  PCO2ART 31.8*  PO2ART 70.0*    Liver Enzymes No results for input(s): AST, ALT, ALKPHOS, BILITOT, ALBUMIN in the last 168 hours.  Cardiac Enzymes No results for input(s): TROPONINI, PROBNP in the last 168 hours.  Glucose  Recent Labs Lab 03/25/15 1241  GLUCAP 114*    Imaging No results found.   STUDIES:  CXR 2/15 > ?Multifocal pneumonia worsening  Echo > LV EF 65-70%, G1DD, Moderate AS, Calcified Mitral Annulus mild stenosis / mild regurg, LA mod dialation, trivial pericardial effusion.  RUE U/S > Negative TEE 2/16 > Nodules, HOCM, No endocarditis visible.  Ct chest 2/17 >> Extensive ground-glass attenuation airspace opacity and  airspace consolidation involving all lobes of both lungs but predominantly the dependent portions of the lower lobes CXR 2/23 > improving ?edema.   CULTURES: BAL Culture 2/15 > ng BAL CMV 2/16 > ng Pneumocystis Smear DFA 2/15 > neg Legionella Culture 2/15 >  Fungus Culture with Smear 2/15 > no yeast or fungus seen.  Blood Culture 2/10 > Neg RVP 2/11 > Negative  ANTIBIOTICS: Levaquin 2/10 - 2/14    LINES/TUBES: CVL 2/15 > Foley 2/15 > ETT 2/15 > 2/19    ASSESSMENT / PLAN:  PULMONARY A: Acute Hypoxic Respiratory Failure  Multifocal Pneumonia vs. Pulmonary Edema vs. Autoimmune Process.  Atypical Pneumonia - ? Mycobacterium, ? Fungal  ? PE - at risk (unlikely with infiltrates) ESR 96 03/20/15 P:   Slowly improving Decreased steroids to 40 biD continue  CARDIOVASCULAR A:  Hypertension - severe range pressures post - CVL Resolved.  Mitral Stenosis Acute diastolic HF - not responding to diuresis.  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 Negative balance achieved and nl PCPW noted  P:  Lopressor to keep HR low for preserved physiology goal 60 Labetalol prn HTN.      RENAL A:   Diuresis for HF as above, over diruesis likely Mild AKI - resolved Hyperphosphatemia - resolved P:   Trending fluid balance,       INFECTIOUS A:   Multifocal pneumonia ? Infectious vs inflammatory R/o aseptic endocarditis, autoimmune related, limpman sachs?, cancer? Febrile 2/17 P:   Abx per ID ID consulted Toxo immunoglobulin negative, cryptococcal ag negative.  CMV culture pending Histo ag neg.  CMV pcr negative. Fungal Ab negative Mycoplasma ab neg Chlamydia panel pending Legionella cx pending Pneumocystis smear pending PCT negative so far.   - infectious workup has been negative.  - no antibiotics at this time. No infectious etiology identified.   AUtoimmune A: r/o autoimmune process, doubt eosinophilc  pna Dsdna neg, RA pos, CCP pos - favor RA  Solumedrol 40 TID> changed to po rx 2/26  Discussed with Rheum. Little likelihood of being related to rheumatoid.   ENDOCRINE A:   Stable, No issues  TSH > normal.  P:   SSI as needed   Continue to wean 02/ steroids as tolerated/ mobilize/ IS > tr to floor soon, dc when O2 down to 4 L consistently  Rigoberto Noel. MD   Kara Mead MD. [facility]. [place] [facility] Pager [phone] If no response call [phone]

## 2015-04-01 NOTE — Progress Notes (Signed)
CSW received consult regarding PT recommendation of SNF at discharge.  Patient is refusing SNF at this time and states she lives next to her sister.  CSW signing off.   Osborne Casco Jimmi Sidener LCSWA [phone]

## 2015-04-02 MED ORDER — HYPROMELLOSE (GONIOSCOPIC) 2.5 % OP SOLN
1.0000 [drp] | Freq: Three times a day (TID) | OPHTHALMIC | Status: DC | PRN
  Administered 2015-04-02 – 2015-04-04 (×4): 1 [drp] via OPHTHALMIC
  Filled 2015-04-02: qty 15

## 2015-04-02 NOTE — Progress Notes (Signed)
CSW and RNCM met with pt re dc plan. Pt reports she is now agreeable to SNF after discussing post d/c needs with family. CSW reviewed placement process and answered questions. Pt requesting SNF in [place]. Will complete FL2/PASRR and initiate SNF search. Once pt chooses facility, the SNF will obtain auth.   Wandra Feinstein, MSW, LCSW [phone] (weekend coverage)

## 2015-04-02 NOTE — Care Management Note (Signed)
Case Management Note  Patient Details  Name: Rachel Baker MRN: [id number] Date of Birth: 06/03/1950  Subjective/Objective:  Patient has now decided she would like to go to SNF, she informed secretary on unit to make NCM aware, CSW aware.                    Action/Plan:   Expected Discharge Date:                  Expected Discharge Plan:  Skilled Nursing Facility  In-House Referral:  Clinical Social Work  Discharge planning Services  CM Consult  Post Acute Care Choice:  Durable Medical Equipment, Home Health Choice offered to:  Patient  DME Arranged:  Bedside commode DME Agency:  [facility]  HH Arranged:  RN, PT [facility] Agency:  [facility]  Status of Service:  In process, will continue to follow  Medicare Important Message Given:  Yes Date Medicare IM Given:    Medicare IM give by:    Date Additional Medicare IM Given:    Additional Medicare Important Message give by:     If discussed at Long Length of Stay Meetings, dates discussed:    Additional Comments:  Leone Haven, RN 04/02/2015, 12:06 PM

## 2015-04-02 NOTE — NC FL2 (Signed)
[place] MEDICAID FL2 LEVEL OF CARE SCREENING TOOL     IDENTIFICATION  Patient Name: Rachel Baker Birthdate: January 07, 1951 Sex: female Admission Date (Current Location): 03/15/2015  [facility] and [place] Number:  Producer, television/film/video and Address:  [facility] [place], [address], [place], [place] [address]      Provider Number: [id number]  Attending Physician Name and Address:  Oretha Milch, MD  Relative Name and Phone Number:       Current Level of Care: Hospital Recommended Level of Care: Skilled Nursing Facility Prior Approval Number:    Date Approved/Denied:   PASRR Number: [id number]  Discharge Plan: SNF    Current Diagnoses: Patient Active Problem List   Diagnosis Date Noted  . Acute respiratory failure with hypoxemia (HCC)   . 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 Cardiac mass   . HOCM (hypertrophic obstructive cardiomyopathy) (HCC)   . Acute respiratory failure with hypoxia (HCC)   . SOB (shortness of breath)   . Morbid obesity with BMI of 40.0-44.9, adult (HCC) 03/19/2015  . Acrocyanosis (HCC)   . Lobar pneumonia (HCC)   . PNA (pneumonia) 03/17/2015  . Acute diastolic heart failure (HCC)   . CAP (community acquired pneumonia) 03/15/2015  . Pneumonia 03/15/2015  . Elevated troponin 03/15/2015    Orientation RESPIRATION BLADDER Height & Weight     Self, Time, Situation, Place  O2 (5L) Continent Weight: 232 lb 4.8 oz (105.371 kg) Height:  5\' 3"  (160 cm)  BEHAVIORAL SYMPTOMS/MOOD NEUROLOGICAL BOWEL NUTRITION STATUS      Continent    AMBULATORY STATUS COMMUNICATION OF NEEDS Skin   Extensive Assist Verbally Normal                       Personal Care Assistance Level of Assistance  Bathing, Feeding, Dressing Bathing Assistance: Limited assistance Feeding assistance: Limited assistance Dressing Assistance: Limited assistance     Functional Limitations Info  Sight, Hearing, Speech Sight Info: Adequate Hearing Info:  Adequate Speech Info: Adequate    SPECIAL CARE FACTORS FREQUENCY  PT (By licensed PT), OT (By licensed OT)                    Contractures Contractures Info: Not present    Additional Factors Info  Code Status Code Status Info: FULL CODE             Current Medications (04/02/2015):  This is the current hospital active medication list Current Facility-Administered Medications  Medication Dose Route Frequency Provider Last Rate Last Dose  . 0.9 %  sodium chloride infusion  250 mL Intravenous PRN 04/04/2015, MD      . ALPRAZolam Kathleene Hazel) tablet 0.5 mg  0.5 mg Oral Q6H PRN Prudy Feeler, MD      . alum & mag hydroxide-simeth (MAALOX/MYLANTA) 200-200-20 MG/5ML suspension 15 mL  15 mL Oral Q4H PRN 08-11-2000, MD   15 mL at 04/01/15 2141  . antiseptic oral rinse (CPC / CETYLPYRIDINIUM CHLORIDE 0.05%) solution 7 mL  7 mL Mouth Rinse BID 2142 V, MD   7 mL at 04/01/15 2139  . aspirin chewable tablet 81 mg  81 mg Per Tube Daily Rahul P Desai, PA-C   81 mg at 04/02/15 1039  . enoxaparin (LOVENOX) injection 40 mg  40 mg Subcutaneous Q24H 04/04/15, MD   40 mg at 03/31/15 2014  . hydroxypropyl methylcellulose / hypromellose (ISOPTO TEARS / GONIOVISC)  2.5 % ophthalmic solution 1 drop  1 drop Both Eyes TID PRN Oretha Milch, MD   1 drop at 04/02/15 1041  . labetalol (NORMODYNE,TRANDATE) injection 10 mg  10 mg Intravenous Q2H PRN Yolande Jolly, MD   10 mg at 03/27/15 0819  . metoprolol (LOPRESSOR) tablet 50 mg  50 mg Oral BID Yolande Jolly, MD   50 mg at 04/02/15 1039  . pantoprazole (PROTONIX) EC tablet 40 mg  40 mg Oral BID AC Nyoka Cowden, MD   40 mg at 04/02/15 0834  . predniSONE (DELTASONE) tablet 40 mg  40 mg Oral BID WC Nyoka Cowden, MD   40 mg at 04/02/15 0834  . sodium chloride flush (NS) 0.9 % injection 3 mL  3 mL Intravenous Q12H Kathleene Hazel, MD   3 mL at 04/02/15 1000  . sodium chloride flush (NS) 0.9 % injection 3 mL  3 mL  Intravenous PRN Kathleene Hazel, MD         Discharge Medications: Please see discharge summary for a list of discharge medications.  Relevant Imaging Results:  Relevant Lab Results:   Additional Information    Deatra Robinson, [place]

## 2015-04-02 NOTE — Progress Notes (Signed)
PULMONARY / CRITICAL CARE MEDICINE   Name: Rachel Baker MRN: [id number] DOB: 18-Jan-1951    ADMISSION DATE:  03/15/2015  CONSULTATION DATE:  03/20/2015  REFERRING MD:  Jonetta Osgood, MD  CHIEF COMPLAINT:  Shortness of breath.   HISTORY OF PRESENT ILLNESS:  Rachel Baker is a 65 y.o. female initially presented with 1-2 days of a flu-like illness with progressive shortness of breath, generalized weakness, fatigue, associated with diarrhea. , was evaluated in the ED with CXR concerning for multifocal pneumonia, and troponin elevation. She was admitted to the hospital on 03/14/2014 Echo showed HOCM pysiology She developed a purple lesion on the tip of her right long finger with concern for embolic source. Additionally, with worsening CXR and respiratory status in spite of treatment, autoimmune workup was also performed which has been negative except for mildly elevated RF.   On 2/15 - she began to develop worsening respiratory status with increasing O2 requirement. She required transfer to the ICU, and intubation. Bronchoscopy performed as well. Has since had slow improvement on empiric steroids  SIGNIFICANT EVENTS: 2/10 > Admitted concern multifocal pneumonia vs. CHF exacerbation 2/13- 14 > little response to diuresis 2/15 > Progressive respiratory failure requiring intubation, bronchoscopy performed 2/16 > TEE not suggestive of endocarditis, has nodules on Mitral and aortic valves with possible embolization 2/24  LHC/ RHC  1. Mild non-obstructive CAD 2. Normal wedge pressure 3. Mean PA pressure 40mHg  SUBJECTIVE:  Afebrile On 5L [place] No CP, dyspnea  VITAL SIGNS: BP 135/62 mmHg  Pulse 67  Temp(Src) 98 F (36.7 C) (Oral)  Resp 25  Ht 5' 3"  (1.6 m)  Wt 232 lb 4.8 oz (105.371 kg)  BMI 41.16 kg/m2  SpO2 97%  02 4lpm NP   INTAKE / OUTPUT: I/O last 3 completed shifts: In: 840 [P.O.:840] Out: 2175 [Urine:2175]    Intake/Output Summary (Last 24 hours) at 04/02/15  1116 Last data filed at 04/02/15 1000  Gross per 24 hour  Intake    483 ml  Output   1675 ml  Net  -1192 ml      PHYSICAL EXAMINATION: General: up in chair/ nad  Neuro: AAOx3, conversant.    HEENT: [place]/AT. PERRL Cardiovascular: RRR, harsh systolic murmur remains, No gallops or rubs, S1/S2.  Lungs:   Crackles rt base, clear left Abdomen: BS x 4, soft, NT/ND.  Musculoskeletal: No gross deformities, no edema.  Skin: Intact, warm, no rashes. Noted Blue discoloration of right long finger improving this am.   LABS:  BMET  Recent Labs Lab 03/28/15 0400 03/29/15 0346 03/30/15 0250  NA 143 139 136  K 5.2* 4.4 4.6  CL 105 104 105  CO2 27 28 26   BUN 47* 43* 35*  CREATININE 0.89 0.75 0.71  GLUCOSE 125* 110* 103*    Electrolytes  Recent Labs Lab 03/28/15 0400 03/29/15 0346 03/30/15 0250  CALCIUM 8.9 8.6* 8.5*    CBC  Recent Labs Lab 03/28/15 0400 03/29/15 0346 03/30/15 0250  WBC 17.4* 19.1* 17.3*  HGB 13.8 13.4 14.0  HCT 44.0 42.6 44.5  PLT 530* 423* 410*    Coag's No results for input(s): APTT, INR in the last 168 hours.  Sepsis Markers No results for input(s): LATICACIDVEN, PROCALCITON, O2SATVEN in the last 168 hours.  ABG  Recent Labs Lab 03/29/15 1355  PHART 7.445  PCO2ART 31.8*  PO2ART 70.0*    Liver Enzymes No results for input(s): AST, ALT, ALKPHOS, BILITOT, ALBUMIN in the last 168 hours.  Cardiac Enzymes  No results for input(s): TROPONINI, PROBNP in the last 168 hours.  Glucose No results for input(s): GLUCAP in the last 168 hours.  Imaging No results found.   STUDIES:  CXR 2/15 > ?Multifocal pneumonia worsening  Echo > LV EF 65-70%, G1DD, Moderate AS, Calcified Mitral Annulus mild stenosis / mild regurg, LA mod dialation, trivial pericardial effusion.  RUE U/S > Negative TEE 2/16 > Nodules, HOCM, No endocarditis visible.  Ct chest 2/17 >> Extensive ground-glass attenuation airspace opacity and airspace consolidation involving  all lobes of both lungs but predominantly the dependent portions of the lower lobes CXR 2/23 > improving ?edema.   CULTURES: BAL Culture 2/15 > ng BAL CMV 2/16 > ng Pneumocystis Smear DFA 2/15 > neg Legionella Culture 2/15 >  Fungus Culture with Smear 2/15 > no yeast or fungus seen.  Blood Culture 2/10 > Neg RVP 2/11 > Negative  ANTIBIOTICS: Levaquin 2/10 - 2/14    LINES/TUBES: CVL 2/15 > out Foley 2/15 > ETT 2/15 > 2/19    ASSESSMENT / PLAN:  PULMONARY A: Acute Hypoxic Respiratory Failure  Multifocal Pneumonia vs. Pulmonary Edema vs. Autoimmune Process.  Atypical Pneumonia - ? Mycobacterium, ? Fungal  ? PE - at risk (unlikely with infiltrates) ESR 96 03/20/15 P:   Slowly improving Decreased steroids to 40 biD continue  CARDIOVASCULAR A:  Hypertension - severe range pressures post - CVL Resolved.  Mitral Stenosis Acute diastolic HF - not responding to diuresis.  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 Negative balance achieved and nl PCPW noted  P:  Lopressor to keep HR low for preserved physiology goal 60 Labetalol prn HTN.      RENAL A:   Diuresis for HF as above, over diruesis likely Mild AKI - resolved Hyperphosphatemia - resolved P:   Keep equal balance       INFECTIOUS A:   Multifocal pneumonia ? Infectious vs inflammatory R/o aseptic endocarditis, autoimmune related, limpman sachs?, cancer? Febrile 2/17 P:   Abx per ID ID consulted Toxo immunoglobulin negative, cryptococcal ag negative.  CMV culture pending Histo ag neg.  CMV pcr negative. Fungal Ab negative Mycoplasma ab neg Chlamydia panel pending Legionella cx pending Pneumocystis smear pending PCT negative so far.   - infectious workup has been negative.  - no antibiotics at this time. No infectious etiology identified.   AUtoimmune A: r/o autoimmune process, doubt eosinophilc pna Dsdna neg, RA pos, CCP pos - favor  RA  Solumedrol 40 TID> changed to po rx 2/26  Discussed with Rheum. Little likelihood of being related to rheumatoid.   ENDOCRINE A:   Stable, No issues  TSH > normal.  P:   SSI as needed   Continue to wean 02/ steroids as tolerated/ mobilize/ IS >  Planning SNF-rehab , pt is agreeable now Needs rheum input as outpt Feel can proceed with cardiac MRI now, may need hand surgery FU in future   Cc to PCP Dr Silver Huguenin MD   Kara Mead MD. [facility] [place] [facility] & Critical [facility] Pager [phone] If no response call [phone]

## 2015-04-02 NOTE — Clinical Social Work Placement (Signed)
   CLINICAL SOCIAL WORK PLACEMENT  NOTE  Date:  04/02/2015  Patient Details  Name: Rachel Baker MRN: [id number] Date of Birth: 1951/01/18  Clinical Social Work is seeking post-discharge placement for this patient at the Skilled  Nursing Facility level of care (*CSW will initial, date and re-position this form in  chart as items are completed):  Yes   Patient/family provided with [place] Clinical Social Work Department's list of facilities offering this level of care within the geographic area requested by the patient (or if unable, by the patient's family).  Yes   Patient/family informed of their freedom to choose among providers that offer the needed level of care, that participate in Medicare, Medicaid or managed care program needed by the patient, have an available bed and are willing to accept the patient.  Yes   Patient/family informed of [facility]'s ownership interest in [facility] and [facility], as well as of the fact that they are under no obligation to receive care at these facilities.  PASRR submitted to EDS on 04/02/15     PASRR number received on 04/02/15     Existing PASRR number confirmed on       FL2 transmitted to all facilities in geographic area requested by pt/family on       FL2 transmitted to all facilities within larger geographic area on 04/02/15     Patient informed that his/her managed care company has contracts with or will negotiate with certain facilities, including the following:            Patient/family informed of bed offers received.  Patient chooses bed at       Physician recommends and patient chooses bed at      Patient to be transferred to   on  .  Patient to be transferred to facility by       Patient family notified on   of transfer.  Name of family member notified:        PHYSICIAN       Additional Comment:    _______________________________________________ Deatra Robinson, LCSW 04/02/2015, 12:47  PM

## 2015-04-03 ENCOUNTER — Inpatient Hospital Stay (HOSPITAL_COMMUNITY): Payer: Medicare HMO

## 2015-04-03 DIAGNOSIS — I340 Nonrheumatic mitral (valve) insufficiency: Secondary | ICD-10-CM

## 2015-04-03 LAB — LEGIONELLA CULTURE

## 2015-04-03 MED ORDER — PREDNISONE 50 MG PO TABS
60.0000 mg | ORAL_TABLET | Freq: Every day | ORAL | Status: DC
  Administered 2015-04-03 – 2015-04-04 (×2): 60 mg via ORAL
  Filled 2015-04-03 (×2): qty 1

## 2015-04-03 NOTE — Care Management Important Message (Signed)
Important Message  Patient Details  Name: Rachel Baker MRN: [id number] Date of Birth: 1950/09/12   Medicare Important Message Given:  Yes    Kyla Balzarine 04/03/2015, 10:23 AM

## 2015-04-03 NOTE — Progress Notes (Signed)
*  PRELIMINARY RESULTS* Echocardiogram 2D Echocardiogram has been performed.  Jeryl Columbia 04/03/2015, 11:32 AM

## 2015-04-03 NOTE — Progress Notes (Signed)
Attempt to reach MD times two about renewing tele order. Will continue to monitor.

## 2015-04-03 NOTE — Progress Notes (Signed)
NURSING PROGRESS NOTE  Rachel Baker [id number] Transfer Data: 04/03/2015 6:42 PM Attending Provider: Oretha Milch, MD [id number], MD Code Status: Full  Rachel Baker is a 65 y.o. female patient transferred from [address]  -No acute distress noted.  -No complaints of shortness of breath.  -No complaints of chest pain.    Blood pressure 131/54, pulse 63, temperature 97.8 F (36.6 C), temperature source Oral, resp. rate 27, height 5\' 3"  (1.6 m), weight 106.595 kg (235 lb), SpO2 95 %.   IV Fluids:  IV in place,SL. Allergies:  Penicillins; Sulfa antibiotics; and Ceclor  Past Medical History:   has a past medical history of CAP (community acquired pneumonia) (03/15/2015); Family history of adverse reaction to anesthesia; Complication of anesthesia; Sleep apnea; History of blood transfusion; GERD (gastroesophageal reflux disease); Ocular migraine; and Arthritis.  Past Surgical History:   has past surgical history that includes Carpal tunnel release (Bilateral); Cataract extraction w/ intraocular lens  implant, bilateral (Bilateral, 2000s); Mouth surgery; and Cardiac catheterization (N/A, 03/29/2015).  Social History:   reports that she has quit smoking. Her smoking use included Cigarettes. She has a 9.57 pack-year smoking history. She has never used smokeless tobacco. She reports that she drinks alcohol. She reports that she does not use illicit drugs.  Skin: Intact  Patient/Family orientated to room. Information packet given to patient/family. Admission inpatient armband information verified with patient/family to include name and date of birth and placed on patient arm. Side rails up x 2, fall assessment and education completed with patient/family. Patient/family able to verbalize understanding of risk associated with falls and verbalized understanding to call for assistance before getting out of bed. Call light within reach. Patient/family able to voice and demonstrate  understanding of unit orientation instructions.    Will continue to evaluate and treat per MD orders.

## 2015-04-03 NOTE — Progress Notes (Signed)
Physical Therapy Treatment Patient Details Name: Rachel Baker MRN: [id number] DOB: 01/04/1951 Today's Date: 04/03/2015    History of Present Illness pt presents with CAP and recent flu.  pt with hx of Carpal Tunnel Release and Cataract Extraction as only PMH.  On 2/15, pt began to develop worsening respiratory status with increasing O2 requirement, required transfer to the ICU, and intubation2/15-2/19. Bronchoscopy performed as well    PT Comments    Pt with decreased anxiety this date and eager for PT. Pt able to tolerate short distance ambulation however SpO2 dec to 78% and stay in 80s on 4LO2 via [place]. It took 10 min for pt to return back into 90s with pursed lipped breathing technique. Pt given HEP and was eager to begin HEP. Con't to recommend ST-SNF upon d/c to improve strength and endurance.  Follow Up Recommendations  SNF;Supervision for mobility/OOB     Equipment Recommendations  None recommended by PT    Recommendations for Other Services       Precautions / Restrictions Precautions Precautions: Fall Precaution Comments: monitor O2 Restrictions Weight Bearing Restrictions: No    Mobility  Bed Mobility Overal bed mobility: Needs Assistance Bed Mobility: Supine to Sit     Supine to sit: Supervision     General bed mobility comments: no physical assist needed  Transfers Overall transfer level: Needs assistance Equipment used: Rolling walker (2 wheeled) Transfers: Sit to/from Stand Sit to Stand: Min guard         General transfer comment: v/c's for safe hand placement  Ambulation/Gait Ambulation/Gait assistance: Min guard;+2 safety/equipment Ambulation Distance (Feet): 22 Feet (x1, 10x1, with seated rest break) Assistive device: Rolling walker (2 wheeled) Gait Pattern/deviations: Decreased stride length;Step-through pattern Gait velocity: decreased Gait velocity interpretation: Below normal speed for age/gender General Gait Details: v/c's for purse  lipped breathing, SPO2 dec to 78%,    Stairs            Wheelchair Mobility    Modified Rankin (Stroke Patients Only)       Balance     Sitting balance-Leahy Scale: Good Sitting balance - Comments: pt able to don socks at bedside   Standing balance support: Bilateral upper extremity supported Standing balance-Leahy Scale: Fair Standing balance comment: RW due to anxiety                    Cognition Arousal/Alertness: Awake/alert Behavior During Therapy: Anxious (but significantly less compared to previous session) Overall Cognitive Status: Within Functional Limits for tasks assessed                      Exercises General Exercises - Lower Extremity Ankle Circles/Pumps: AROM;Seated;5 reps;Both Long Arc Quad: AROM;Both;10 reps;Seated    General Comments        Pertinent Vitals/Pain Pain Assessment: No/denies pain    Home Living                      Prior Function            PT Goals (current goals can now be found in the care plan section) Acute Rehab PT Goals Patient Stated Goal: improve breathing Progress towards PT goals: Progressing toward goals    Frequency  Min 3X/week    PT Plan Current plan remains appropriate    Co-evaluation             End of Session Equipment Utilized During Treatment: Gait belt;Oxygen (4LO2 via ) Activity Tolerance:  Treatment limited secondary to medical complications (Comment) (breathing) Patient left: in chair;with call bell/phone within reach     Time: 1208-1230 PT Time Calculation (min) (ACUTE ONLY): 22 min  Charges:  $Gait Training: 8-22 mins                    G Codes:      Marcene Brawn 04/03/2015, 12:38 PM   Lewis Shock, PT, DPT Pager #: [phone] Office #: [phone]

## 2015-04-03 NOTE — Progress Notes (Signed)
PULMONARY / CRITICAL CARE MEDICINE   Name: Rachel Baker MRN: [id number] DOB: [birth year]    ADMISSION DATE:  03/15/2015  CONSULTATION DATE:  03/20/2015  REFERRING MD:  Jonetta Osgood, MD  CHIEF COMPLAINT:  Shortness of breath.   HISTORY OF PRESENT ILLNESS:  Rachel Baker is a 65 y.o. female initially presented with 1-2 days of a flu-like illness with progressive shortness of breath, generalized weakness, fatigue, associated with diarrhea. , was evaluated in the ED with CXR concerning for multifocal pneumonia, and troponin elevation. She was admitted to the hospital on 03/14/2014 Echo showed HOCM pysiology She developed a purple lesion on the tip of her right long finger with concern for embolic source. Additionally, with worsening CXR and respiratory status in spite of treatment, autoimmune workup was also performed which has been negative except for mildly elevated RF.   On 2/15 - she began to develop worsening respiratory status with increasing O2 requirement. She required transfer to the ICU, and intubation. Bronchoscopy performed as well. Has since had slow improvement on empiric steroids  SIGNIFICANT EVENTS: 2/10 > Admitted concern multifocal pneumonia vs. CHF exacerbation 2/13- 14 > little response to diuresis 2/15 > Progressive respiratory failure requiring intubation, bronchoscopy performed 2/16 > TEE not suggestive of endocarditis, has nodules on Mitral and aortic valves with possible embolization 2/24  LHC/ RHC  1. Mild non-obstructive CAD 2. Normal wedge pressure 3. Mean PA pressure 56mHg  SUBJECTIVE:  Afebrile, oob to chair  Remains On 5L [place] No CP, dyspnea  VITAL SIGNS: BP 150/70 mmHg  Pulse 65  Temp(Src) 98 F (36.7 C) (Oral)  Resp 32  Ht 5' 3"  (1.6 m)  Wt 235 lb (106.595 kg)  BMI 41.64 kg/m2  SpO2 84%  02 4lpm NP   INTAKE / OUTPUT: I/O last 3 completed shifts: In: 878[P.O.:840; I.V.:6] Out: 2025 [Urine:2025]    Intake/Output Summary (Last 24  hours) at 04/03/15 0902 Last data filed at 04/03/15 0550  Gross per 24 hour  Intake    486 ml  Output    900 ml  Net   -414 ml      PHYSICAL EXAMINATION: General: up in chair/ nad  Neuro: AAOx3, conversant.    HEENT: /AT. PERRL Cardiovascular: RRR, harsh systolic murmur remains, No gallops or rubs, S1/S2.  Lungs:   Crackles rt base, clear left Abdomen: BS x 4, soft, NT/ND.  Musculoskeletal: No gross deformities, no edema.  Skin: Intact, warm, no rashes. Noted Blue discoloration of right long finger improving this am.   LABS:  BMET  Recent Labs Lab 03/28/15 0400 03/29/15 0346 03/30/15 0250  NA 143 139 136  K 5.2* 4.4 4.6  CL 105 104 105  CO2 27 28 26   BUN 47* 43* 35*  CREATININE 0.89 0.75 0.71  GLUCOSE 125* 110* 103*    Electrolytes  Recent Labs Lab 03/28/15 0400 03/29/15 0346 03/30/15 0250  CALCIUM 8.9 8.6* 8.5*    CBC  Recent Labs Lab 03/28/15 0400 03/29/15 0346 03/30/15 0250  WBC 17.4* 19.1* 17.3*  HGB 13.8 13.4 14.0  HCT 44.0 42.6 44.5  PLT 530* 423* 410*    Coag's No results for input(s): APTT, INR in the last 168 hours.  Sepsis Markers No results for input(s): LATICACIDVEN, PROCALCITON, O2SATVEN in the last 168 hours.  ABG  Recent Labs Lab 03/29/15 1355  PHART 7.445  PCO2ART 31.8*  PO2ART 70.0*    Liver Enzymes No results for input(s): AST, ALT, ALKPHOS, BILITOT, ALBUMIN in the  last 168 hours.  Cardiac Enzymes No results for input(s): TROPONINI, PROBNP in the last 168 hours.  Glucose No results for input(s): GLUCAP in the last 168 hours.  Imaging No results found.   STUDIES:  CXR 2/15 > ?Multifocal pneumonia worsening  Echo > LV EF 65-70%, G1DD, Moderate AS, Calcified Mitral Annulus mild stenosis / mild regurg, LA mod dialation, trivial pericardial effusion.  RUE U/S > Negative TEE 2/16 > Nodules, HOCM, No endocarditis visible.  Ct chest 2/17 >> Extensive ground-glass attenuation airspace opacity and  airspace consolidation involving all lobes of both lungs but predominantly the dependent portions of the lower lobes CXR 2/23 > improving ?edema.   CULTURES: BAL Culture 2/15 > ng BAL CMV 2/16 > ng Pneumocystis Smear DFA 2/15 > neg Legionella Culture 2/15 >  Fungus Culture with Smear 2/15 > no yeast or fungus seen.  Blood Culture 2/10 > Neg RVP 2/11 > Negative  ANTIBIOTICS: Levaquin 2/10 - 2/14    LINES/TUBES: CVL 2/15 > out Foley 2/15 > ETT 2/15 > 2/19    ASSESSMENT / PLAN:  PULMONARY A: Acute Hypoxic Respiratory Failure  Multifocal Pneumonia vs. Pulmonary Edema vs. Autoimmune Process.  Atypical Pneumonia - ? Mycobacterium, ? Fungal  ? PE - at risk (unlikely with infiltrates) ESR 96 03/20/15 P:   Slowly improving   CARDIOVASCULAR A:  Hypertension - severe range pressures post - CVL Resolved.  Mitral Stenosis Acute diastolic HF - not responding to diuresis.  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 Negative balance achieved and nl PCPW noted  P:  Lopressor to keep HR low for preserved physiology goal 60 Labetalol prn HTN.  D/w cards - rpt TTecho for mitral valve nodule (better than MRi)     RENAL A:   Diuresis for HF as above, over diruesis likely Mild AKI - resolved Hyperphosphatemia - resolved P:   Keep equal balance       INFECTIOUS A:   Multifocal pneumonia ? Infectious vs inflammatory R/o aseptic endocarditis, autoimmune related, limpman sachs?, cancer? Febrile 2/17 P:   Abx per ID ID consulted Toxo immunoglobulin negative, cryptococcal ag negative.  CMV culture pending Histo ag neg.  CMV pcr negative. Fungal Ab negative Mycoplasma ab neg Chlamydia panel pending Legionella cx pending Pneumocystis smear pending PCT negative so far.   - infectious workup has been negative.  - no antibiotics at this time. No infectious etiology identified.   AUtoimmune A: r/o autoimmune  process, doubt eosinophilc pna Dsdna neg, RA pos, CCP pos - favor RA  Solumedrol 40 TID> changed to po rx 2/26  Discussed with Rheum. Little likelihood of being related to rheumatoid.  Decreased steroids to 40 daily on discharge & slow taper by 40m/ week to 20 mg Out pt rheum FU  ENDOCRINE A:   Stable, No issues  TSH > normal.  P:   SSI as needed   Continue to wean 02/ steroids as tolerated/ mobilize/ IS >  Planning SNF-rehab , pt is agreeable now Needs rheum input as outpt Await rpt TTE, may need hand surgery FU in future   Cc to PCP Dr HSilver HugueninMD   RKara MeadMD. FMenifee [facility] Pager [phone] no response call [phone]

## 2015-04-03 NOTE — Progress Notes (Signed)
Pt accepted offer from [facility]. CSW notified Jasmine December at [facility] who informed CSW she will now need to clear pt offer with DON. CSW will follow.   Dellie Burns, MSW, LCSW

## 2015-04-04 MED ORDER — PANTOPRAZOLE SODIUM 40 MG PO TBEC: 40.0000 mg | DELAYED_RELEASE_TABLET | Freq: Two times a day (BID) | ORAL | Status: AC

## 2015-04-04 MED ORDER — ALPRAZOLAM 0.5 MG PO TABS: 0.5000 mg | ORAL_TABLET | Freq: Three times a day (TID) | ORAL | Status: AC | PRN

## 2015-04-04 MED ORDER — ASPIRIN 81 MG PO CHEW: 81.0000 mg | CHEWABLE_TABLET | Freq: Every day | ORAL | Status: AC

## 2015-04-04 MED ORDER — METOPROLOL TARTRATE 50 MG PO TABS: 50.0000 mg | ORAL_TABLET | Freq: Two times a day (BID) | ORAL | Status: AC

## 2015-04-04 MED ORDER — ALUM & MAG HYDROXIDE-SIMETH 200-200-20 MG/5ML PO SUSP: 15.0000 mL | ORAL | Status: AC | PRN

## 2015-04-04 MED ORDER — PREDNISONE 10 MG PO TABS: ORAL_TABLET | ORAL | Status: DC

## 2015-04-04 NOTE — Clinical Social Work Placement (Signed)
   CLINICAL SOCIAL WORK PLACEMENT  NOTE  Date:  04/04/2015  Patient Details  Name: Rachel Baker MRN: [id number] Date of Birth: 05-17-1950  Clinical Social Work is seeking post-discharge placement for this patient at the Skilled  Nursing Facility level of care (*CSW will initial, date and re-position this form in  chart as items are completed):  Yes   Patient/family provided with [facility] Clinical Social Work Department's list of facilities offering this level of care within the geographic area requested by the patient (or if unable, by the patient's family).  Yes   Patient/family informed of their freedom to choose among providers that offer the needed level of care, that participate in Medicare, Medicaid or managed care program needed by the patient, have an available bed and are willing to accept the patient.  Yes   Patient/family informed of [facility]'s ownership interest in [facility] and [facility], as well as of the fact that they are under no obligation to receive care at these facilities.  PASRR submitted to EDS on 04/02/15     PASRR number received on 04/02/15     Existing PASRR number confirmed on       FL2 transmitted to all facilities in geographic area requested by pt/family on       FL2 transmitted to all facilities within larger geographic area on 04/02/15     Patient informed that his/her managed care company has contracts with or will negotiate with certain facilities, including the following:        Yes   Patient/family informed of bed offers received.  Patient chooses bed at [facility]     Physician recommends and patient chooses bed at      Patient to be transferred to [facility] on 04/04/15.  Patient to be transferred to facility by [facility]     Patient family notified on 04/04/15 of transfer.  Name of family member notified:  Patient oriented     PHYSICIAN       Additional Comment:     _______________________________________________ Mearl Latin, LCSWA 04/04/2015, 3:46 PM

## 2015-04-04 NOTE — Progress Notes (Signed)
Patient will DC to: [facility] Anticipated DC date: 04/04/15 Family notified: Patient oriented and will call sister Transport by: [facility]  CSW signing off.  Cristobal Goldmann, [place] Clinical Social Worker [phone]

## 2015-04-04 NOTE — Progress Notes (Signed)
Patient Name: Rachel Baker Date of Encounter: 04/04/2015   SUBJECTIVE  Still intermittent SOB on supplemental oxygen. No chest pain or sob.   CURRENT MEDS . antiseptic oral rinse  7 mL Mouth Rinse BID  . aspirin  81 mg Per Tube Daily  . enoxaparin (LOVENOX) injection  40 mg Subcutaneous Q24H  . metoprolol tartrate  50 mg Oral BID  . pantoprazole  40 mg Oral BID AC  . predniSONE  60 mg Oral Q breakfast  . sodium chloride flush  3 mL Intravenous Q12H    OBJECTIVE  Filed Vitals:   04/03/15 1844 04/03/15 2131 04/04/15 0130 04/04/15 0536  BP: 139/42 116/64  150/53  Pulse: 69 70  60  Temp: 98.4 F (36.9 C) 98.5 F (36.9 C)  98 F (36.7 C)  TempSrc: Oral Oral  Oral  Resp: 18 18    Height:      Weight:   226 lb (102.513 kg)   SpO2: 94% 92%  96%    Intake/Output Summary (Last 24 hours) at 04/04/15 1254 Last data filed at 04/04/15 0959  Gross per 24 hour  Intake    360 ml  Output   1850 ml  Net  -1490 ml   Filed Weights   04/02/15 0350 04/03/15 0500 04/04/15 0130  Weight: 232 lb 4.8 oz (105.371 kg) 235 lb (106.595 kg) 226 lb (102.513 kg)    PHYSICAL EXAM  General: Pleasant, obese female in NAD. Nasal cannula in palce Neuro: Alert and oriented X 3. Moves all extremities spontaneously. Psych: Normal affect. HEENT:  Normal  Neck: Supple without bruits or JVD. Lungs:  Resp regular and unlabored, CTA. Heart: RRR no s3, s4, or murmurs. Abdomen: Soft, non-tender, non-distended, BS + x 4.  Extremities: No clubbing, cyanosis or edema. DP/PT/Radials 2+ and equal bilaterally.  Accessory Clinical Findings  Echo 03/16/15: Left ventricle: The cavity size was normal. Wall thickness was increased in a pattern of severe LVH. Systolic function was vigorous. The estimated ejection fraction was in the range of 65% to 70%. Wall motion was normal; there were no regional wall motion abnormalities. Doppler parameters are consistent with abnormal left ventricular  relaxation (grade 1 diastolic dysfunction). Doppler parameters are consistent with high ventricular filling pressure. - Aortic valve: Valve mobility was restricted. 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 - Mitral valve: Severely calcified annulus. The findings are consistent with mild stenosis. There was mild regurgitation. Valve area by pressure half-time: 1.96 cm^2. - Left atrium: The atrium was moderately dilated. - Pericardium, extracardiac: A trivial pericardial effusion was identified. Impressions: - Vigorous LV function; grade 1 diastolic dysfunction with elevated LV filling pressure; severe LVH; intracavitary gradient of 3.3 m/s; moderate LAE; severe MAC with mild MR and mild MS; moderate AS (mean gradient 20 mmHg).  TEE: 03/21/15: Left ventricle: Severe LVH with more significant focal basal septal hypertrophy suggestive of HOCM. There is subvalvular acceleration and outflow tract obstruction. The estimated ejection fraction was 75%. Wall motion was normal; there were no regional wall motion abnormalities. - Aortic valve: Trileaflet. There is a calcified nodule on the NCC. - Mitral valve: SAM, mild posteriorly directed regurgitation. Calcified mass on the subvalvular apparatus of the posterior leaflet. - Left atrium: The atrium was dilated. No evidence of thrombus in the atrial cavity or appendage. - Right atrium: No evidence of thrombus in the atrial cavity or appendage. - Atrial septum: No defect or patent foramen ovale was identified. - Tricuspid valve: No evidence of vegetation. - Pulmonic valve: No  evidence of vegetation. Impressions: - No endocarditis. Calcified nodules on the NCC of the aortic valve and mitral valve apparatus, with MAC. There is SAM of the anterior leaflet with mild, posteriorly directed MR. There is significant LVOT obstruction (see surface echo) with severe basal septal hypertrophy consistent with HOCM. No aortic  stenosis is identified. EF 75%.  Right/Left Heart Cath and Coronary Angiography  03/29/15    Conclusion     Ramus lesion, 20% stenosed.  Mid LAD to Dist LAD lesion, 30% stenosed.  Dist LAD lesion, 40% stenosed.  Ost 2nd Diag to 2nd Diag lesion, 30% stenosed.  Mid RCA lesion, 20% stenosed.  1. Mild non-obstructive CAD 2. Normal wedge pressure 3. Mean PA pressure  Recommendations: I do not think she needs further diuresis. Medical management of CAD.    Echo 04/03/15 LV EF: 75%  ------------------------------------------------------------------- History:  PMH: F/U Vegetation Mitral Valve Disorder.  ------------------------------------------------------------------- Study Conclusions  - Left ventricle: The cavity size was normal. There was severe focal basal hypertrophy of the septum with moderate hypertrophy of the posterior wall. Consistent with hypertrophic cardiomyopathy. Systolic function was normal. The estimated ejection fraction was 75%. There was dynamic obstruction at restin the mid cavity, with a peak gradient of 9 mm Hg. Wall motion was normal; there were no regional wall motion abnormalities. Doppler parameters are consistent with abnormal left ventricular relaxation (grade 1 diastolic dysfunction). - Aortic valve: Right coronary and noncoronary cusp mobility was restricted. 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 There was mild regurgitation. Peak velocity (S): 317.44 cm/s. Mean gradient (S): 22 mm Hg. Regurgitation pressure half-time: 772 ms. - Mitral valve: There is a calcified, mobile density on the posterior leaflet of the mitral valve measuring 1.4 cm x 0.6 cm. There is also heavy calcification of the mitral valve annulus, worse posteriorly. Mobility was restricted. The findings are consistent with moderate stenosis. There was mild regurgitation. Pressure half-time: 152 ms. Mean gradient (D): 5 mm Hg. Valve area by  pressure half-time: 1.45 cm^2. - Right ventricle: The cavity size was normal. Wall thickness was normal. Systolic function was normal. - Tricuspid valve: Structurally normal valve. There was no regurgitation. - Inferior vena cava: The vessel was normal in size. The respirophasic diameter changes were in the normal range (= 50%), consistent with normal central venous pressure. - Pericardium, extracardiac: A trivial pericardial effusion was identified posterior to the heart.  Impressions:  - On review, echo appears unchanged from 03/16/15. Consider transesophageal echo for better evaluation of the mitral valve.   ASSESSMENT AND PLAN   1. Acute diastolic heart failure:  - Diuresed negative 14.3 L with 13lb weight loss. Cardiac cath with nonobstructive CAD. PCWP normal. - She is on steroids.   2. HOCM: She has findings c/w HOCM on echo 2/11. There are degenerative valvular changes but there is subvalvular obstruction by TEE 03/21/15.    3. Mital valve nodule: TEE showed an unusual hyperechoic nodule attached to the posterior mitral valve apparatus on the ventricular side. Per Dr. Gibson Ramp last note 03/28/15  "It could be an atypical myxoma or an unusually large papillary fibroelastoma. Would plan cardiac MRI next week when she is more stable to better define" - Repeat TTE 3/1 was unchanged from 2/11. recommended TEE for better evaluation.  - Will discuss with Dr. Katrinka Blazing regarding further plan. Cardiac MRI vs TEE with timing.   Signed, Bhagat,Bhavinkumar PA-C Pager [phone]  The patient has been seen in conjunction with B. Bhagat PAC. All aspects of care have been considered  and discussed. The patient has been personally interviewed, examined, and all clinical data has been reviewed.   There has been interrupted cardiac follow-up. It is my first time analyzing the patient's data.  The mitral valve mass appears to be calcified and could represent an old vegetation or  perhaps a slow-growing cardiac tumor/fibroblastoma.  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  The patient will need to have longitudinal follow-up of the mitral valve mass, perhaps repeating a transesophageal echo in 4-6 months. As I review the chart, cardiology follow-up should be with Dr. Rachelle Hora Croitoru who saw her initially on 03/16/15. No further workup is needed currently, but she will need outpatient follow-up to arrange surveillance.

## 2015-04-04 NOTE — Progress Notes (Signed)
Nsg Discharge Note  Admit Date:  03/15/2015 Discharge date: 04/04/2015   Rachel Baker to be D/C'd Rehab per MD order.  AVS completed.  Copy for chart, and copy for patient signed, and dated. Patient/caregiver able to verbalize understanding.  Discharge Medication:   Medication List    STOP taking these medications        B COMPLEX PO     beta carotene 15 MG capsule     Bilberry 1000 MG Caps     Boron 3 MG Caps     cetirizine-pseudoephedrine 5-120 MG tablet  Commonly known as:  ZYRTEC-D     Cinnamon 500 MG capsule     CRANBERRY EXTRACT PO     GARLIC PO     Lecithin 1200 MG Caps     Lysine 1000 MG Tabs     OVER THE COUNTER MEDICATION     PROBIOTIC PO     Red Yeast Rice 600 MG Tabs     TUMS PO     vitamin C 1000 MG tablet      TAKE these medications        ALPRAZolam 0.5 MG tablet  Commonly known as:  XANAX  Take 1 tablet (0.5 mg total) by mouth 3 (three) times daily as needed for anxiety.     alum & mag hydroxide-simeth 200-200-20 MG/5ML suspension  Commonly known as:  MAALOX/MYLANTA  Take 15 mLs by mouth every 4 (four) hours as needed for indigestion or heartburn.     aspirin 81 MG chewable tablet  Chew 1 tablet (81 mg total) by mouth daily.     metoprolol 50 MG tablet  Commonly known as:  LOPRESSOR  Take 1 tablet (50 mg total) by mouth 2 (two) times daily.     pantoprazole 40 MG tablet  Commonly known as:  PROTONIX  Take 1 tablet (40 mg total) by mouth 2 (two) times daily before a meal.     predniSONE 10 MG tablet  Commonly known as:  DELTASONE  40mg  po daily x 7 days, then 30mg  po daily x 7 days, then 20mg  po daily and hold until seen by rheumatology        Discharge Assessment: Filed Vitals:   04/04/15 0536 04/04/15 1432  BP: 150/53 131/42  Pulse: 60 59  Temp: 98 F (36.7 C) 98.3 F (36.8 C)  Resp:  18   Skin clean, dry and intact without evidence of skin break down, no evidence of skin tears noted. IV catheter discontinued  intact. Site without signs and symptoms of complications - no redness or edema noted at insertion site, patient denies c/o pain - only slight tenderness at site.  Dressing with slight pressure applied.  D/c Instructions-Education: Discharge instructions given to patient/family with verbalized understanding. D/c education completed with patient/family including follow up instructions, medication list, d/c activities limitations if indicated, with other d/c instructions as indicated by MD - patient able to verbalize understanding, all questions fully answered. Patient instructed to return to ED, call 911, or call MD for any changes in condition.  Patient escorted via WC, and D/C home via private auto.  , RN 04/04/2015 4:25 PM  Nsg Discharge Note  Admit Date:  03/15/2015 Discharge date: 04/04/2015   06/04/2015 to be D/C'd Rehab per MD order.  AVS completed.  Copy for chart, and copy for patient signed, and dated. Report called to nurse at [facility] Patient/caregiver able to verbalize understanding.  Discharge Medication:  Medication List    STOP taking these medications        B COMPLEX PO     beta carotene 15 MG capsule     Bilberry 1000 MG Caps     Boron 3 MG Caps     cetirizine-pseudoephedrine 5-120 MG tablet  Commonly known as:  ZYRTEC-D     Cinnamon 500 MG capsule     CRANBERRY EXTRACT PO     GARLIC PO     Lecithin 1200 MG Caps     Lysine 1000 MG Tabs     OVER THE COUNTER MEDICATION     PROBIOTIC PO     Red Yeast Rice 600 MG Tabs     TUMS PO     vitamin C 1000 MG tablet      TAKE these medications        ALPRAZolam 0.5 MG tablet  Commonly known as:  XANAX  Take 1 tablet (0.5 mg total) by mouth 3 (three) times daily as needed for anxiety.     alum & mag hydroxide-simeth 200-200-20 MG/5ML suspension  Commonly known as:  MAALOX/MYLANTA  Take 15 mLs by mouth every 4 (four) hours as needed for indigestion or heartburn.     aspirin 81  MG chewable tablet  Chew 1 tablet (81 mg total) by mouth daily.     metoprolol 50 MG tablet  Commonly known as:  LOPRESSOR  Take 1 tablet (50 mg total) by mouth 2 (two) times daily.     pantoprazole 40 MG tablet  Commonly known as:  PROTONIX  Take 1 tablet (40 mg total) by mouth 2 (two) times daily before a meal.     predniSONE 10 MG tablet  Commonly known as:  DELTASONE  40mg  po daily x 7 days, then 30mg  po daily x 7 days, then 20mg  po daily and hold until seen by rheumatology        Discharge Assessment: Filed Vitals:   04/04/15 0536 04/04/15 1432  BP: 150/53 131/42  Pulse: 60 59  Temp: 98 F (36.7 C) 98.3 F (36.8 C)  Resp:  18   Skin clean, dry and intact without evidence of skin break down, no evidence of skin tears noted. IV catheter discontinued intact. Site without signs and symptoms of complications - no redness or edema noted at insertion site, patient denies c/o pain - only slight tenderness at site.  Dressing with slight pressure applied.  D/c Instructions-Education: Discharge instructions given to patient/family with verbalized understanding. D/c education completed with patient/family including follow up instructions, medication list, d/c activities limitations if indicated, with other d/c instructions as indicated by MD - patient able to verbalize understanding, all questions fully answered. Patient instructed to return to ED, call 911, or call MD for any changes in condition.  Patient transported via [facility].  , RN 04/04/2015 4:25 PM

## 2015-04-04 NOTE — Discharge Summary (Signed)
Physician Discharge Summary  Patient ID: Rachel Baker MRN: [id number] DOB/AGE: Oct 07, 1950 65 y.o.  Admit date: 03/15/2015 Discharge date: 04/04/2015    Discharge Diagnoses:  Active Problems:   CAP (community acquired pneumonia)   Elevated troponin   Acute diastolic heart failure (HCC)   Lobar pneumonia (HCC)   Morbid obesity with BMI of 40.0-44.9, adult (HCC)   Acute respiratory failure with hypoxia (HCC)   SOB (shortness of breath)   HOCM (hypertrophic obstructive cardiomyopathy) (HCC)   Mitral valve mass   Cardiac mass   Acute respiratory failure with hypoxemia ([place])                                                        D/c plan by Discharge Diagnosis  Acute hypoxic respiratory failure   - unclear etiology.  ?Multifocal Pneumonia vs. Pulmonary Edema vs. Autoimmune Process -- ESR 96 (03/20/15), RF pos.  Also ?Atypical Pneumonia - ? Mycobacterium, ? Fungal.  Doubt eosinophilic PNA.  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  Plan -  Continue lopressor 67m37mD outpt cardiology f/u -- will likely need repeat TEE in 4-6 months.   Autoimmune r/o autoimmune process, doubt eosinophilc pna Dsdna neg, RA pos, CCP pos - favor RA Plan -  Discussed with Rheum. Little likelihood of being related to rheumatoid.  Decreased steroids to 40 daily on discharge & slow taper by 10mg15mek to 20 mg Out pt rheum FU   Mild AKI - resolved  PLAN -  PCP f/u  Repeat chem   Finger lesion - ?embolic source  Plan -  May need hand f/u - will defer to PCP once acute issues resolved     Brief Summary: Rachel Baker 65 y.65 y/o female with a PMH of OSA, GERD who  initially presented 2/10 with 1-2 day hx viral symptoms, SOB, fatigue, diarrhea.  She was evaluated in ER and workup revealed CAP and troponin elevation.  She was admitted by hospitalist, treated with IV abx for CAP and had further w/u revealing some heart failure, mitral and aortic stenosis. Echo showed HOCM phsyciology.  She developed a purple lesion on the tip of her R long finger with concern for embolic source. Despite diuresis and broad spectrum abx she had worsening CXR and respiratory status.  On 2/15 she had worsening hypoxia and increased WOB, PCCM was consulted for ICU tx and she ultimately required intubation.  TEE on 2/16 was not suggestive on endocarditis but revealed nodules on mitral and aortic valves with possible embolization.  Mitral valve mass was again seen on repeat echo.  Cardiology has followed closely and feels this may be old vegetation vs slow-growing cardiac tumor/fibroblastoma.  She will need to follow closely with cardiology and likely needs repeat TEE in 4-6 months.  She underwent R and L heart cath on 2/24 which revealed non-obstructive CAD, with normal wedge and PA pressures.  She was continued on broad spectrum abx and started  on empiric steroids.  Cultures remain negative to date and infectious w/u overall remains negative.  Her abx were stopped 2/14.  Autoimmune w/u revealed a POS RA, pos CCP, however this was discussed with rheumatology who felt this was unlikely to be related to rheumatoid.   She was extubated 2/19 and continues to improve slowly although no clear etiology has been defined.  She will need cardiology and rheumatology f/u as an outpt and potentially hand surgery.  She remains debilitated and is agreeable to short term SNF for rehab on d/c.     Consults: ID  Cardiology   Lines/tubes: CVL 2/15 > out Foley 2/15 > ETT 2/15 > 2/19  ABX:  Levaquin 2/10 - 2/14  CULTURES: BAL Culture 2/15 > ng BAL CMV 2/16 > ng Pneumocystis Smear DFA 2/15 >  neg Legionella Culture (FOB) 2/15 > neg Fungus Culture with Smear 2/15 > no yeast or fungus seen.  Blood Culture 2/10 > Neg RVP 2/11 > Negative  Autoimmune:  Toxo immunoglobulin negative, cryptococcal ag negative.  CMV culture pending Histo ag neg.  CMV pcr negative. Fungal Ab negative Mycoplasma ab neg Chlamydia panel pending Legionella cx pending Pneumocystis smear pending PCT negative so far   Significant Diagnostic Studies:  CXR 2/15 > ?Multifocal pneumonia worsening  Echo > LV EF 65-70%, G1DD, Moderate AS, Calcified Mitral Annulus mild stenosis / mild regurg, LA mod dialation, trivial pericardial effusion.  RUE U/S > Negative TEE 2/16 > Nodules, HOCM, No endocarditis visible.  Ct chest 2/17 >> Extensive ground-glass attenuation airspace opacity and airspace consolidation involving all lobes of both lungs but predominantly the dependent portions of the lower lobes CXR 2/23 > improving ?edema.  2D echo 3/1>>> EF 50%, G1 diastolic dysfunction, mod AS, mod MS - essentially unchanged from echo 2/11    Filed Vitals:   04/03/15 2131 04/04/15 0130 04/04/15 0536 04/04/15 1432  BP: 116/64  150/53 131/42  Pulse: 70  60 59  Temp: 98.5 F (36.9 C)  98 F (36.7 C) 98.3 F (36.8 C)  TempSrc: Oral  Oral Oral  Resp: 18   18  Height:      Weight:  102.513 kg (226 lb)    SpO2: 92%  96% 94%     Discharge Labs  BMET  Recent Labs Lab 03/29/15 0346 03/30/15 0250  NA 139 136  K 4.4 4.6  CL 104 105  CO2 28 26  GLUCOSE 110* 103*  BUN 43* 35*  CREATININE 0.75 0.71  CALCIUM 8.6* 8.5*     CBC   Recent Labs Lab 03/29/15 0346 03/30/15 0250  HGB 13.4 14.0  HCT 42.6 44.5  WBC 19.1* 17.3*  PLT 423* 410*          Follow-up Information    Follow up with [facility].   Why:  HHRN, HHPT   Contact information:   [address] [place] [address] [phone]       Follow up with [facility].   Why:   Bariatric drop arm bsc   Contact information:   [address] [place] [address] [phone]       Follow up with Wenda Low, MD. Schedule an appointment as soon as possible for a visit in 2 weeks.   Specialty:  Internal Medicine   Contact information:   [address] [place] [address] [phone]       Follow up with Marijean Bravo, MD On 04/17/2015.  Specialty:  Rheumatology   Why:  1:30pm    Contact information:   [address] [place] [address] [phone]       Follow up with Rexene Edison, NP On 04/25/2015.   Specialty:  Pulmonary Disease   Why:  3:15pm   Contact information:   [address] [place] [address] [phone]       Follow up with Sanda Klein, MD On 04/22/2015.   Specialty:  Cardiology   Why:  @3 :30pm for post hosptial f/u   Contact information:   [address] [place] [address] [phone]          Medication List    STOP taking these medications        B COMPLEX PO     beta carotene 15 MG capsule     Bilberry 1000 MG Caps     Boron 3 MG Caps     cetirizine-pseudoephedrine 5-120 MG tablet  Commonly known as:  ZYRTEC-D     Cinnamon 500 MG capsule     CRANBERRY EXTRACT PO     GARLIC PO     Lecithin 5852 MG Caps     Lysine 1000 MG Tabs     OVER THE COUNTER MEDICATION     PROBIOTIC PO     Red Yeast Rice 600 MG Tabs     TUMS PO     vitamin C 1000 MG tablet      TAKE these medications        ALPRAZolam 0.5 MG tablet  Commonly known as:  XANAX  Take 1 tablet (0.5 mg total) by mouth 3 (three) times daily as needed for anxiety.     alum & mag hydroxide-simeth 200-200-20 MG/5ML suspension  Commonly known as:  MAALOX/MYLANTA  Take 15 mLs by mouth every 4 (four) hours as needed for indigestion or heartburn.     aspirin 81 MG chewable tablet  Chew 1 tablet (81 mg total) by mouth daily.     metoprolol 50 MG tablet  Commonly known as:   LOPRESSOR  Take 1 tablet (50 mg total) by mouth 2 (two) times daily.     pantoprazole 40 MG tablet  Commonly known as:  PROTONIX  Take 1 tablet (40 mg total) by mouth 2 (two) times daily before a meal.     predniSONE 10 MG tablet  Commonly known as:  DELTASONE  80m po daily x 7 days, then 328mpo daily x 7 days, then 2036mo daily and hold until seen by rheumatology          Disposition:  SNF  Discharged Condition: Rachel Baker met maximum benefit of inpatient care and is medically stable and cleared for discharge.  Patient is pending follow up as above.      Time spent on disposition:  Greater than 35 minutes.   Signed: KNickolas MadridP 04/04/2015  3:36 PM Pager: [phone]

## 2015-04-04 NOTE — Progress Notes (Signed)
She is stable for discharge to SNF She will need follow-up with pulmonary in 2-4 weeks to reassess need for oxygen Need cardiology input and follow up on repeat echo which still shows nodule on mitral valve. She will need follow-up with rheumatology for positive rheumatoid factor and CCP Steroids can be slowly tapered over the next 2 weeks to a dose of 20 mg until seen by rheumatology. Follow-up with PCP Dr. Eula Listen and a copy of discharge summary will be sent to him. He may have to arrange follow-up with hand surgery depending on how the ischemic finger evolves   Rachel Baker. MD

## 2015-04-05 ENCOUNTER — Encounter: Payer: Self-pay | Admitting: Internal Medicine

## 2015-04-05 ENCOUNTER — Non-Acute Institutional Stay (SKILLED_NURSING_FACILITY): Payer: Medicare HMO | Admitting: Internal Medicine

## 2015-04-05 DIAGNOSIS — I10 Essential (primary) hypertension: Secondary | ICD-10-CM

## 2015-04-05 DIAGNOSIS — I519 Heart disease, unspecified: Secondary | ICD-10-CM

## 2015-04-05 DIAGNOSIS — F411 Generalized anxiety disorder: Secondary | ICD-10-CM

## 2015-04-05 DIAGNOSIS — J9601 Acute respiratory failure with hypoxia: Secondary | ICD-10-CM | POA: Diagnosis not present

## 2015-04-05 DIAGNOSIS — K219 Gastro-esophageal reflux disease without esophagitis: Secondary | ICD-10-CM | POA: Diagnosis not present

## 2015-04-05 DIAGNOSIS — D72829 Elevated white blood cell count, unspecified: Secondary | ICD-10-CM

## 2015-04-05 DIAGNOSIS — I5189 Other ill-defined heart diseases: Secondary | ICD-10-CM

## 2015-04-05 DIAGNOSIS — R5381 Other malaise: Secondary | ICD-10-CM

## 2015-04-05 NOTE — Progress Notes (Signed)
LOCATION: [facility]  PCP: Georgann Housekeeper, MD   Code Status: Full Code  Goals of care: Advanced Directive information Advanced Directives 03/15/2015  Does patient have an advance directive? No  Would patient like information on creating an advanced directive? No - patient declined information       Extended Emergency Contact Information Primary Emergency Contact: Salazar,Carol Address: [address]          [place], [place] [address] [place] Home Phone: [phone] Mobile Phone: [phone] Relation: Sister   Allergies  Allergen Reactions  . Penicillins Hives and Itching    Has patient had a PCN reaction causing immediate rash, facial/tongue/throat swelling, SOB or lightheadedness with hypotension: Yes Has patient had a PCN reaction causing severe rash involving mucus membranes or skin necrosis: No Has patient had a PCN reaction that required hospitalization No Has patient had a PCN reaction occurring within the last 10 years: No If all of the above answers are "NO", then may proceed with Cephalosporin use.   . Sulfa Antibiotics Hives and Itching  . Ceclor [Cefaclor] Itching and Rash    Chief Complaint  Patient presents with  . New Admit To SNF    New Admission     HPI:  Patient is a 65 y.o. female seen today for short term rehabilitation post hospital admission from 03/15/15-04/04/15 with acute respiratory failure requiring intubation. She was treated for pneumonia and pulmonary edema initially. Echocardiogram showed HOCM changes. There were thought of inflammatory process of unknown etiology. She developed purple discoloration to right finger tip thought to be from embolic source. She underwent work up for autoimmune process and is pending rheumatology follow up. She was treated with prednisone and is to be on slow taper until her outpatient follow up appointment. She was seen by cardiology and infective endocarditis was ruled out. 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 She underwent right and left heart catheterization on 03/29/15 which revealed non obstructive CAD. Her infectious workup was negative and she was followed by ID.  She has PMH of HTN, GERD, arthritis. She is seen in her room today. She has dyspnea with minimal exertion and is on 4 l o2 at present. Her o2 saturation has been dropping with minimal exertion per therapy team.    Review of Systems:  Constitutional: Negative for fever, chills. Positive for malaise HENT: Negative for headache, congestion, nasal discharge, sore throat, difficulty swallowing.   Eyes: Negative for blurred vision, double vision and discharge.  Respiratory: Negative for cough and wheezing.   Cardiovascular: Negative for chest pain, palpitations Gastrointestinal: Negative for heartburn, nausea, vomiting, abdominal pain. Had bowel movement yesterday Genitourinary: Negative for dysuria Musculoskeletal: Negative for fall Skin: Negative for itching, rash.  Neurological: Negative for dizziness Psychiatric/Behavioral: Negative for depression   Past Medical History  Diagnosis Date  . CAP (community acquired pneumonia) 03/15/2015  . Family history of adverse reaction to anesthesia     "my mother got combative"  . Complication of anesthesia     "I hallucinated when I had teeth worked on"  . Sleep apnea     "need breath rite strip sometimes" (03/15/2015)  . History of blood transfusion     "w/tooth implant"  . GERD (gastroesophageal reflux disease)   . Ocular migraine     "all the time" (03/15/2015)  . Arthritis     "all over; different joints" (03/15/2015)   Past Surgical History  Procedure Laterality Date  .  Carpal tunnel release Bilateral   . Cataract extraction w/ intraocular lens  implant, bilateral Bilateral 2000s  . Mouth surgery      dental implant  . Cardiac catheterization N/A 03/29/2015    Procedure: Right/Left Heart Cath and Coronary  Angiography;  Surgeon: Kathleene Hazel, MD;  Location: [facility] INVASIVE CV LAB;  Service: Cardiovascular;  Laterality: N/A;   Social History:   reports that she has quit smoking. Her smoking use included Cigarettes. She has a 9.57 pack-year smoking history. She has never used smokeless tobacco. She reports that she drinks alcohol. She reports that she does not use illicit drugs.  History reviewed. No pertinent family history.  Medications:   Medication List       This list is accurate as of: 04/05/15  8:56 AM.  Always use your most recent med list.               ALPRAZolam 0.5 MG tablet  Commonly known as:  XANAX  Take 1 tablet (0.5 mg total) by mouth 3 (three) times daily as needed for anxiety.     alum & mag hydroxide-simeth 200-200-20 MG/5ML suspension  Commonly known as:  MAALOX/MYLANTA  Take 15 mLs by mouth every 4 (four) hours as needed for indigestion or heartburn.     aspirin 81 MG chewable tablet  Chew 1 tablet (81 mg total) by mouth daily.     metoprolol 50 MG tablet  Commonly known as:  LOPRESSOR  Take 1 tablet (50 mg total) by mouth 2 (two) times daily.     pantoprazole 40 MG tablet  Commonly known as:  PROTONIX  Take 1 tablet (40 mg total) by mouth 2 (two) times daily before a meal.     predniSONE 10 MG tablet  Commonly known as:  DELTASONE  Take 10 mg by mouth daily. Stop date 04/12/15     predniSONE 10 MG tablet  Commonly known as:  DELTASONE  Take 10 mg by mouth daily. Stop date 04/20/15     predniSONE 20 MG tablet  Commonly known as:  DELTASONE  Take 20 mg by mouth daily.        Immunizations: Immunization History  Administered Date(s) Administered  . Influenza-Unspecified 11/03/2014  . PPD Test 04/04/2015  . Pneumococcal Polysaccharide-23 03/16/2015     Physical Exam: Filed Vitals:   04/05/15 0837  BP: 159/72  Pulse: 70  Temp: 96.8 F (36 C)  TempSrc: Oral  Resp: 16  Height: 5\' 3"  (1.6 m)  Weight: 232 lb (105.235 kg)  SpO2: 90%     Body mass index is 41.11 kg/(m^2).  General- elderly female, obese, in no acute distress Head- normocephalic, atraumatic Nose- no maxillary or frontal sinus tenderness, no nasal discharge Throat- moist mucus membrane  Eyes- PERRLA, EOMI, no pallor, no icterus Neck- no cervical lymphadenopathy Cardiovascular- normal s1,s2, no murmur, no leg edema Respiratory- bilateral poor inspiratory effort, decreased air entry, no crackles or rhonchi, on o2 by nasal canula Abdomen- bowel sounds present, soft, non tender Musculoskeletal- able to move all 4 extremities, generalized weakness Neurological- alert and oriented to person, place and time Skin- warm and dry Psychiatry- normal mood and affect    Labs reviewed: Basic Metabolic Panel:  Recent Labs  96/78/93 0400 03/22/15 0330  03/28/15 0400 03/29/15 0346 03/30/15 03/30/15 0250  NA 136 138  < > 143 139 136* 136  K 4.0 4.1  < > 5.2* 4.4  --  4.6  CL 98* 101  < > 105  104  --  105  CO2 24 27  < > 27 28  --  26  GLUCOSE 110* 113*  < > 125* 110*  --  103*  BUN 27* 25*  < > 47* 43* 35* 35*  CREATININE 1.22* 0.71  < > 0.89 0.75 0.7 0.71  CALCIUM 8.9 9.0  < > 8.9 8.6*  --  8.5*  MG 2.0 2.2  --   --   --   --   --   PHOS 7.3* 4.1  --   --   --   --   --   < > = values in this interval not displayed. Liver Function Tests:  Recent Labs  03/16/15 1158 03/17/15 0309  AST 27 24  ALT 21 22  ALKPHOS 59 59  BILITOT 0.2* 0.4  PROT 5.8* 6.2*  ALBUMIN 2.3* 2.3*   No results for input(s): LIPASE, AMYLASE in the last 8760 hours. No results for input(s): AMMONIA in the last 8760 hours. CBC:  Recent Labs  03/15/15 1219  03/20/15 2000  03/28/15 0400 03/29/15 0346 03/30/15 0250  WBC 11.7*  < > 20.1*  < > 17.4* 19.1* 17.3*  NEUTROABS 9.1*  --  17.5*  --   --   --   --   HGB 14.1  < > 12.9  < > 13.8 13.4 14.0  HCT 43.1  < > 39.7  < > 44.0 42.6 44.5  MCV 91.1  < > 90.2  < > 92.1 93.0 92.3  PLT 478*  < > 586*  < > 530* 423* 410*  < >  = values in this interval not displayed. Cardiac Enzymes:  Recent Labs  03/15/15 2029 03/16/15 0232 03/16/15 1158  TROPONINI 0.97* 0.91* 0.61*   BNP: Invalid input(s): POCBNP CBG:  Recent Labs  03/25/15 0335 03/25/15 0829 03/25/15 1241  GLUCAP 101* 124* 114*    Radiological Exams: Dg Chest 2 View  03/20/2015  CLINICAL DATA:  Shortness of breath with exertion. EXAM: CHEST  2 VIEW COMPARISON:  03/18/2015 and 03/15/2015 FINDINGS: Lungs are adequately inflated with multifocal airspace process over the mid to lower lungs bilaterally left worse than right. Findings demonstrate overall slight worsening. Possible small amount left pleural fluid. Findings likely due to multifocal infection. Borderline stable cardiomegaly. Remainder of the exam is unchanged. IMPRESSION: Multifocal airspace process over the mid to lower lungs left greater than right with slight interval worsening likely representing multifocal infection. Possible small amount left pleural fluid. Stable borderline cardiomegaly. Electronically Signed   By: Elberta Fortis M.D.   On: 03/20/2015 08:07   Dg Chest 2 View  03/18/2015  CLINICAL DATA:  Shortness of breath and cough since Friday, former smoker EXAM: CHEST  2 VIEW COMPARISON:  03/15/2015 FINDINGS: Enlargement of cardiac silhouette. Stable mediastinal contours. Diffuse BILATERAL airspace infiltrates, increased at RIGHT lower lobe versus previous study. Remaining lungs grossly unchanged. No definite pleural effusion or pneumothorax. Degenerative disc disease changes thoracic spine. IMPRESSION: Diffuse BILATERAL airspace infiltrates question pneumonia, increased in RIGHT lower lobe since previous exam. Electronically Signed   By: Ulyses Southward M.D.   On: 03/18/2015 08:10   Dg Chest 2 View  03/15/2015  CLINICAL DATA:  Severe shortness of breath for 1 week EXAM: CHEST  2 VIEW COMPARISON:  None. FINDINGS: Mild cardiac enlargement. Vascular pattern is normal. Multifocal airspace  disease throughout the bilateral mid to lower lung zones, left worse than right. No pleural effusion. IMPRESSION: Moderately severe multifocal bilateral nonspecific airspace disease.  One consideration would be pneumonia. Electronically Signed   By: Esperanza Heir M.D.   On: 03/15/2015 11:19   Ct Chest Wo Contrast  03/22/2015  CLINICAL DATA:  65 year old female with extensive history of malignancy. Evaluate for underlying neoplasm. EXAM: CT CHEST WITHOUT CONTRAST TECHNIQUE: Multidetector CT imaging of the chest was performed following the standard protocol without IV contrast. COMPARISON:  Multiple recent prior chest x-rays most recently earlier today 5:28 a.m. FINDINGS: Mediastinum: Unremarkable CT appearance of the thyroid gland. No suspicious mediastinal or hilar adenopathy. No soft tissue mediastinal mass. The thoracic esophagus is unremarkable and conveys a nasogastric tube into the stomach. Heart/Vascular: Borderline cardiomegaly. Small pericardial effusion. Caseous calcification of the mitral valve annulus. Scattered atherosclerotic calcifications along the course of the coronary arteries. Lungs/Pleura: Multifocal regions of airspace consolidation and ground-glass attenuation opacity involving the dependent portions of the right upper and bilateral lower lobes as well as the periphery of these left greater than right upper lobes and the right middle lobe. Respiratory motion artifact is present. No definite pleural effusion. Bones/Soft Tissues: No acute fracture or aggressive appearing lytic or blastic osseous lesion. Multilevel degenerative disc disease throughout the thoracic spine. Upper Abdomen: Nasogastric tube coiled in the stomach. Otherwise, the imaged abdomen is unremarkable. IMPRESSION: 1. Extensive ground-glass attenuation airspace opacity and airspace consolidation involving all lobes of both lungs but predominantly the dependent portions of the lower lobes. Differential considerations include  multifocal pneumonia, aspiration, and less likely asymmetric or noncardiogenic pulmonary edema. Atypical processes including eosinophilic pneumonitis and organizing pneumonia are also considerations. 2. Borderline cardiomegaly. 3. Small pericardial effusion. 4. Coronary artery calcifications. Electronically Signed   By: Malachy Moan M.D.   On: 03/22/2015 16:14   Dg Chest Port 1 View  03/28/2015  CLINICAL DATA:  Patient with respiratory failure. EXAM: PORTABLE CHEST 1 VIEW COMPARISON:  Chest radiograph 03/26/2015. FINDINGS: Interval removal central venous catheter. Stable cardiac and mediastinal contours. Low lung volumes. Improving bilateral mid and lower lung airspace opacities. No definite pneumothorax. Possible small bilateral pleural effusions. IMPRESSION: Interval removal central venous catheter. Slight interval improvement bilateral airspace opacities which may represent improving edema or infection. Possible small bilateral pleural effusions. Electronically Signed   By: Annia Belt M.D.   On: 03/28/2015 11:23   Dg Chest Port 1 View  03/26/2015  CLINICAL DATA:  Respiratory failure. EXAM: PORTABLE CHEST 1 VIEW COMPARISON:  03/25/2015. FINDINGS: Left IJ line in stable position. Cardiomegaly with bilateral pulmonary infiltrates suggesting pulmonary edema. Bilateral pneumonia cannot be excluded. Infiltrates have progressed from prior exam. Small left pleural effusion. No pneumothorax. IMPRESSION: 1. Left IJ line in stable position. 2. Cardiomegaly with progressive bilateral pulmonary infiltrates suggesting pulmonary edema. Bilateral pneumonia cannot be excluded. Small left pleural effusion. Electronically Signed   By: Maisie Fus  Register   On: 03/26/2015 07:12   Dg Chest Port 1 View  03/25/2015  CLINICAL DATA:  65 year old female with acute respiratory failure. EXAM: PORTABLE CHEST 1 VIEW COMPARISON:  Chest radiograph dated 03/24/2015 FINDINGS: There has been interval removal of the endotracheal and  enteric tube. A left IJ central venous line with tip overlying the central SVC again noted. There is stable cardiac silhouette. Bilateral lower lung field airspace opacities appear grossly stable or slightly improved compared to prior study. A small left pleural effusion may be present. No acute osseous pathology. IMPRESSION: Interval removal of the endotracheal and enteric tube. Grossly stable or minimally improved bilateral lower lung field airspace opacities. Electronically Signed   By: Elgie Collard  M.D.   On: 03/25/2015 07:22   Dg Chest Port 1 View  03/24/2015  CLINICAL DATA:  Acute respiratory failure. EXAM: PORTABLE CHEST 1 VIEW COMPARISON:  03/23/2015. FINDINGS: Endotracheal tube in satisfactory position. Nasogastric tube extending into the stomach. Left jugular catheter tip in the superior vena cava. Stable enlarged cardiac silhouette and prominent pulmonary vasculature. Mildly decreased airspace opacity in both lower lung zones. No visible pleural fluid. Thoracic spine degenerative changes. IMPRESSION: 1. Mildly improved bibasilar pneumonia, aspiration pneumonitis or alveolar edema. 2. Stable cardiomegaly and pulmonary vascular congestion. Electronically Signed   By: Beckie Salts M.D.   On: 03/24/2015 07:51   Dg Chest Port 1 View  03/23/2015  CLINICAL DATA:  65 year old female currently intubated EXAM: PORTABLE CHEST 1 VIEW COMPARISON:  Prior chest x-ray and CT scan of the chest 03/22/2015 FINDINGS: The endotracheal tube is 2.4 cm above the carina. A nasogastric tube is present and coiled within the stomach. Left IJ central venous catheter the tip of which overlies the mid SVC. Stable cardiac and mediastinal contours. Bilateral patchy interstitial and airspace opacities most prominent in the lower lobes. No pneumothorax. No large pleural effusion. No acute osseous abnormality. IMPRESSION: 1. The tip the endotracheal tube is 2.4 cm above the carina. 2. Left IJ approach central venous catheter with  the tip overlying the mid SVC. 3. Persistent diffuse bilateral interstitial and airspace opacities most confluent in the lower lobes concerning for multifocal pneumonia versus aspiration. Electronically Signed   By: Malachy Moan M.D.   On: 03/23/2015 07:37   Dg Chest Port 1 View  03/22/2015  CLINICAL DATA:  Intubated EXAM: PORTABLE CHEST 1 VIEW COMPARISON:  03/21/2015 FINDINGS: Cardiomegaly again noted. Stable endotracheal and NG tube position. Persistent patchy airspace disease/pneumonia bilaterally. Slight improvement in aeration right midlung. Question small left pleural effusion. Central mild vascular congestion without convincing pulmonary edema. Stable left IJ central line with tip in SVC. IMPRESSION: Stable support apparatus. Persistent patchy airspace disease/pneumonia bilaterally. Slight improvement in aeration right midlung. Question small left pleural effusion. Central mild vascular congestion without convincing pulmonary edema. Electronically Signed   By: Natasha Mead M.D.   On: 03/22/2015 08:13   Portable Chest Xray  03/21/2015  CLINICAL DATA:  Acute on chronic respiratory failure, community-acquired pneumonia, acute CHF, known aortic and mitral stenosis EXAM: PORTABLE CHEST 1 VIEW COMPARISON:  Portable chest x-ray of March 20, 2015 FINDINGS: The lungs are well-expanded. The left hemidiaphragm is slightly better demonstrated today. Patchy confluent interstitial and alveolar opacities persist bilaterally. The cardiac silhouette is enlarged. The pulmonary vascularity is mildly prominent centrally but stable. The endotracheal tube tip lies 3 cm above the carina. The esophagogastric tube tip projects below the inferior margin of the image. The left internal jugular venous catheter tip projects over the midportion of the SVC. IMPRESSION: Persistent bilateral pneumonia. Superimposed low-grade CHF. Slight interval improvement since yesterday's study. The support tubes are in reasonable position.  Electronically Signed   By: David  [place] M.D.   On: 03/21/2015 07:44   Dg Chest Port 1 View  03/20/2015  CLINICAL DATA:  Encounter for intubation, bronchoscopy and central line placement. EXAM: PORTABLE CHEST 1 VIEW COMPARISON:  03/20/2015 at 7:20 a.m. FINDINGS: Endotracheal tube tip projects 3.4 cm about the carina. Nasal/ orogastric tube passes below the diaphragm well into the stomach below the included field of view. Left internal jugular central venous line tip projects in the lower superior vena cava. No pneumothorax. Bilateral lung opacities are without significant change from the  earlier study allowing differences in positioning and technique. No new lung abnormalities. IMPRESSION: 1. Endotracheal tube, nasal/orogastric tube and left internal jugular central venous line are well positioned. 2. No pneumothorax. Electronically Signed   By: Amie Portland M.D.   On: 03/20/2015 19:32    Assessment/Plan  Physical deconditioning Will have him work with physical therapy and occupational therapy team to help with gait training and muscle strengthening exercises.fall precautions. Skin care. Encourage to be out of bed.   Acute respiratory failure Of unclear etiology. Needs outpatient rheumatology, cardiology and pulmonology follow up. Has upcoming appointment with all these services. Called Dr Vassie Loll and clarified her prednisone regimen. Will change her prednisone to 60 mg daily x 5 days, then 50 mg daily x 5 days, then 40 mg daily for a week and taper down by 10 mg weekly and leave her on 20 mg daily until seen by rheum and pulmonology. Continue o2 by nasal canula 4l and increase to 6 l on exertion and monitor. Monitor cbc and bmp.   Leukocytosis With her on prednisone monitor cbc  GAD Stable, continue xanax 0.5 mg tid prn  Hypertension Monitor bp, continue lopressor 50 mg bid and baby aspirin. Check bmp  gerd Stable, continue protonix 40 mg bid  Diastolic dysfunction Noted on echocardiogram.  Has HOCM changes and valvular stenosis. Has f/u with cardiology. Continue baby aspirin   Goals of care: short term rehabilitation   Labs/tests ordered: cbc, cmp 04/08/15  Family/ staff Communication: reviewed care plan with patient and nursing supervisor    Oneal Grout, MD Internal Medicine [facility] [address] [place], [place] [address] Cell Phone (Monday-Friday 8 am - 5 pm): [phone] On Call: [phone] and follow prompts after 5 pm and on weekends Office Phone: [phone] Office Fax: [phone]

## 2015-04-09 LAB — FUNGAL ANTIBODIES PANEL, ID-BLOOD

## 2015-04-10 ENCOUNTER — Encounter (HOSPITAL_COMMUNITY): Payer: Self-pay | Admitting: Internal Medicine

## 2015-04-10 ENCOUNTER — Emergency Department (HOSPITAL_COMMUNITY): Payer: Medicare HMO

## 2015-04-10 ENCOUNTER — Encounter (HOSPITAL_COMMUNITY): Payer: Self-pay | Admitting: Infectious Diseases

## 2015-04-10 ENCOUNTER — Inpatient Hospital Stay (HOSPITAL_COMMUNITY)
Admission: EM | Admit: 2015-04-10 | Discharge: 2015-05-04 | DRG: 853 | Disposition: E | Payer: Medicare HMO | Attending: Internal Medicine | Admitting: Internal Medicine

## 2015-04-10 ENCOUNTER — Encounter (HOSPITAL_COMMUNITY): Payer: Self-pay | Admitting: Emergency Medicine

## 2015-04-10 DIAGNOSIS — A419 Sepsis, unspecified organism: Secondary | ICD-10-CM | POA: Diagnosis not present

## 2015-04-10 DIAGNOSIS — Z87891 Personal history of nicotine dependence: Secondary | ICD-10-CM

## 2015-04-10 DIAGNOSIS — I058 Other rheumatic mitral valve diseases: Secondary | ICD-10-CM | POA: Diagnosis present

## 2015-04-10 DIAGNOSIS — I7389 Other specified peripheral vascular diseases: Secondary | ICD-10-CM | POA: Diagnosis present

## 2015-04-10 DIAGNOSIS — Z66 Do not resuscitate: Secondary | ICD-10-CM | POA: Diagnosis present

## 2015-04-10 DIAGNOSIS — I342 Nonrheumatic mitral (valve) stenosis: Secondary | ICD-10-CM | POA: Diagnosis present

## 2015-04-10 DIAGNOSIS — Z452 Encounter for adjustment and management of vascular access device: Secondary | ICD-10-CM

## 2015-04-10 DIAGNOSIS — R778 Other specified abnormalities of plasma proteins: Secondary | ICD-10-CM

## 2015-04-10 DIAGNOSIS — Z881 Allergy status to other antibiotic agents status: Secondary | ICD-10-CM

## 2015-04-10 DIAGNOSIS — R34 Anuria and oliguria: Secondary | ICD-10-CM | POA: Diagnosis not present

## 2015-04-10 DIAGNOSIS — E874 Mixed disorder of acid-base balance: Secondary | ICD-10-CM | POA: Diagnosis present

## 2015-04-10 DIAGNOSIS — Z79899 Other long term (current) drug therapy: Secondary | ICD-10-CM

## 2015-04-10 DIAGNOSIS — Z978 Presence of other specified devices: Secondary | ICD-10-CM

## 2015-04-10 DIAGNOSIS — Z880 Allergy status to penicillin: Secondary | ICD-10-CM

## 2015-04-10 DIAGNOSIS — I421 Obstructive hypertrophic cardiomyopathy: Secondary | ICD-10-CM | POA: Diagnosis present

## 2015-04-10 DIAGNOSIS — G4733 Obstructive sleep apnea (adult) (pediatric): Secondary | ICD-10-CM | POA: Diagnosis present

## 2015-04-10 DIAGNOSIS — Z888 Allergy status to other drugs, medicaments and biological substances status: Secondary | ICD-10-CM

## 2015-04-10 DIAGNOSIS — Z6841 Body Mass Index (BMI) 40.0 and over, adult: Secondary | ICD-10-CM

## 2015-04-10 DIAGNOSIS — I509 Heart failure, unspecified: Secondary | ICD-10-CM | POA: Insufficient documentation

## 2015-04-10 DIAGNOSIS — I952 Hypotension due to drugs: Secondary | ICD-10-CM | POA: Insufficient documentation

## 2015-04-10 DIAGNOSIS — Z515 Encounter for palliative care: Secondary | ICD-10-CM | POA: Diagnosis not present

## 2015-04-10 DIAGNOSIS — Z01818 Encounter for other preprocedural examination: Secondary | ICD-10-CM

## 2015-04-10 DIAGNOSIS — J189 Pneumonia, unspecified organism: Secondary | ICD-10-CM

## 2015-04-10 DIAGNOSIS — I38 Endocarditis, valve unspecified: Secondary | ICD-10-CM | POA: Diagnosis present

## 2015-04-10 DIAGNOSIS — I248 Other forms of acute ischemic heart disease: Secondary | ICD-10-CM | POA: Diagnosis present

## 2015-04-10 DIAGNOSIS — Z4659 Encounter for fitting and adjustment of other gastrointestinal appliance and device: Secondary | ICD-10-CM

## 2015-04-10 DIAGNOSIS — R0602 Shortness of breath: Secondary | ICD-10-CM | POA: Diagnosis not present

## 2015-04-10 DIAGNOSIS — J9690 Respiratory failure, unspecified, unspecified whether with hypoxia or hypercapnia: Secondary | ICD-10-CM

## 2015-04-10 DIAGNOSIS — J101 Influenza due to other identified influenza virus with other respiratory manifestations: Secondary | ICD-10-CM | POA: Diagnosis present

## 2015-04-10 DIAGNOSIS — R6521 Severe sepsis with septic shock: Secondary | ICD-10-CM | POA: Diagnosis present

## 2015-04-10 DIAGNOSIS — G9340 Encephalopathy, unspecified: Secondary | ICD-10-CM | POA: Diagnosis present

## 2015-04-10 DIAGNOSIS — J9600 Acute respiratory failure, unspecified whether with hypoxia or hypercapnia: Secondary | ICD-10-CM | POA: Diagnosis present

## 2015-04-10 DIAGNOSIS — I5033 Acute on chronic diastolic (congestive) heart failure: Secondary | ICD-10-CM | POA: Diagnosis present

## 2015-04-10 DIAGNOSIS — I5031 Acute diastolic (congestive) heart failure: Secondary | ICD-10-CM | POA: Diagnosis present

## 2015-04-10 DIAGNOSIS — Z9911 Dependence on respirator [ventilator] status: Secondary | ICD-10-CM

## 2015-04-10 DIAGNOSIS — Z7952 Long term (current) use of systemic steroids: Secondary | ICD-10-CM

## 2015-04-10 DIAGNOSIS — R739 Hyperglycemia, unspecified: Secondary | ICD-10-CM | POA: Diagnosis not present

## 2015-04-10 DIAGNOSIS — J9601 Acute respiratory failure with hypoxia: Secondary | ICD-10-CM | POA: Diagnosis present

## 2015-04-10 DIAGNOSIS — N170 Acute kidney failure with tubular necrosis: Secondary | ICD-10-CM | POA: Diagnosis present

## 2015-04-10 DIAGNOSIS — K219 Gastro-esophageal reflux disease without esophagitis: Secondary | ICD-10-CM | POA: Diagnosis present

## 2015-04-10 DIAGNOSIS — Z7982 Long term (current) use of aspirin: Secondary | ICD-10-CM

## 2015-04-10 DIAGNOSIS — R0902 Hypoxemia: Secondary | ICD-10-CM

## 2015-04-10 DIAGNOSIS — J1000 Influenza due to other identified influenza virus with unspecified type of pneumonia: Secondary | ICD-10-CM | POA: Diagnosis present

## 2015-04-10 DIAGNOSIS — R7989 Other specified abnormal findings of blood chemistry: Secondary | ICD-10-CM

## 2015-04-10 DIAGNOSIS — J80 Acute respiratory distress syndrome: Secondary | ICD-10-CM | POA: Insufficient documentation

## 2015-04-10 HISTORY — DX: Atherosclerotic heart disease of native coronary artery without angina pectoris: I25.10

## 2015-04-10 LAB — BASIC METABOLIC PANEL
ANION GAP: 12 (ref 5–15)
BUN: 14 mg/dL (ref 6–20)
CALCIUM: 8.8 mg/dL — AB (ref 8.9–10.3)
CO2: 24 mmol/L (ref 22–32)
Chloride: 103 mmol/L (ref 101–111)
Creatinine, Ser: 0.78 mg/dL (ref 0.44–1.00)
GFR calc non Af Amer: >60 mL/min (ref >60)
Glucose, Bld: 116 mg/dL — ABNORMAL HIGH (ref 65–99)
Potassium: 3.9 mmol/L (ref 3.5–5.1)
Sodium: 139 mmol/L (ref 135–145)

## 2015-04-10 LAB — CBC
HEMATOCRIT: 39.3 % (ref 36.0–46.0)
HEMOGLOBIN: 12.9 g/dL (ref 12.0–15.0)
MCH: 30 pg (ref 26.0–34.0)
MCHC: 32.8 g/dL (ref 30.0–36.0)
MCV: 91.4 fL (ref 78.0–100.0)
Platelets: 248 10*3/uL (ref 150–400)
RBC: 4.3 MIL/uL (ref 3.87–5.11)
RDW: 16.9 % — ABNORMAL HIGH (ref 11.5–15.5)
WBC: 16.1 10*3/uL — AB (ref 4.0–10.5)

## 2015-04-10 LAB — I-STAT TROPONIN, ED: Troponin i, poc: 0.28 ng/mL (ref 0.00–0.08)

## 2015-04-10 NOTE — ED Notes (Signed)
Per [facility] SOB start about 1.5 hrs ago. Comging from [place] facility. Lungs are clear and color are good. PT 02 stats were 88% on 4L [place] per her norm. 90-91% on 4L with EMs. Past few weeks she has been 92-95 is her normal. No complaints of pain.12 Lead "looked good" 149/65 73 22R 93% 4L [place]

## 2015-04-10 NOTE — ED Notes (Signed)
Pt does not want RN to start an IV

## 2015-04-10 NOTE — ED Provider Notes (Signed)
CSN: [id number]     Arrival date & time 04/14/2015  2217 History   First MD Initiated Contact with Patient 04/30/2015 2219     Chief Complaint  Patient presents with  . Shortness of Breath     (Consider location/radiation/quality/duration/timing/severity/associated sxs/prior Treatment) The history is provided by the patient and medical records. No language interpreter was used.     Rachel Baker is a 65 y.o. female  with a hx of CAP, GERD, cardiac nodule (plan for cardiac MRI next week) presents to the Emergency Department complaining of acute persistent SOB onset 1.5 hours PTA.  Pt is on 4L O2 via [place] at all times. She reports she took her oxygen off after walking to the bathroom in an effort to change clothes and believes she left it off too long.  Pt reports she became acutely SOB and when RN arrived her SPO2 was 88%.  She was placed back on her O2 and by the time EMS arrived, SPO2 had improved only to 90%.  Pt is very anxious on her arrival to the ED.  Pt denies fever, chills, headache, neck pain, chest pain, abdominal pain, nausea, vomiting, diarrhea, syncope, dysuria.  Past Medical History  Diagnosis Date  . CAP (community acquired pneumonia) 03/15/2015  . Family history of adverse reaction to anesthesia     "my mother got combative"  . Complication of anesthesia     "I hallucinated when I had teeth worked on"  . Sleep apnea     "need breath rite strip sometimes" (03/15/2015)  . History of blood transfusion     "w/tooth implant"  . GERD (gastroesophageal reflux disease)   . Ocular migraine     "all the time" (03/15/2015)  . Arthritis     "all over; different joints" (03/15/2015)   Past Surgical History  Procedure Laterality Date  . Carpal tunnel release Bilateral   . Cataract extraction w/ intraocular lens  implant, bilateral Bilateral 2000s  . Mouth surgery      dental implant  . Cardiac catheterization N/A 03/29/2015    Procedure: Right/Left Heart Cath and Coronary  Angiography;  Surgeon: Kathleene Hazel, MD;  Location: [facility] INVASIVE CV LAB;  Service: Cardiovascular;  Laterality: N/A;   No family history on file. Social History  Substance Use Topics  . Smoking status: Former Smoker -- 0.33 packs/day for 29 years    Types: Cigarettes  . Smokeless tobacco: Never Used     Comment: "quit smoking in ~ 1999  . Alcohol Use: Yes     Comment: 03/15/2015 "glass of wine a couple times/month; mixed drink maybe once/month"   OB History    No data available     Review of Systems  Constitutional: Negative for fever, diaphoresis, appetite change, fatigue and unexpected weight change.  HENT: Negative for mouth sores.   Eyes: Negative for visual disturbance.  Respiratory: Positive for shortness of breath. Negative for cough, chest tightness and wheezing.   Cardiovascular: Negative for chest pain.  Gastrointestinal: Negative for nausea, vomiting, abdominal pain, diarrhea and constipation.  Endocrine: Negative for polydipsia, polyphagia and polyuria.  Genitourinary: Negative for dysuria, urgency, frequency and hematuria.  Musculoskeletal: Negative for back pain and neck stiffness.  Skin: Negative for rash.  Allergic/Immunologic: Negative for immunocompromised state.  Neurological: Negative for syncope, light-headedness and headaches.  Hematological: Does not bruise/bleed easily.  Psychiatric/Behavioral: Negative for sleep disturbance. The patient is not nervous/anxious.       Allergies  Penicillins; Sulfa antibiotics; and Ceclor  Home Medications   Prior to Admission medications   Medication Sig Start Date End Date Taking? Authorizing Provider  ALPRAZolam Prudy Feeler) 0.5 MG tablet Take 1 tablet (0.5 mg total) by mouth 3 (three) times daily as needed for anxiety. 04/04/15   Bernadene Person, NP  alum & mag hydroxide-simeth (MAALOX/MYLANTA) 200-200-20 MG/5ML suspension Take 15 mLs by mouth every 4 (four) hours as needed for indigestion or heartburn.  04/04/15   Bernadene Person, NP  aspirin 81 MG chewable tablet Chew 1 tablet (81 mg total) by mouth daily. 04/04/15   Bernadene Person, NP  metoprolol (LOPRESSOR) 50 MG tablet Take 1 tablet (50 mg total) by mouth 2 (two) times daily. 04/04/15   Bernadene Person, NP  pantoprazole (PROTONIX) 40 MG tablet Take 1 tablet (40 mg total) by mouth 2 (two) times daily before a meal. 04/04/15   Bernadene Person, NP  predniSONE (DELTASONE) 10 MG tablet Take 10 mg by mouth daily. Stop date 04/12/15    Historical Provider, MD  predniSONE (DELTASONE) 10 MG tablet Take 10 mg by mouth daily. Stop date 04/20/15    Historical Provider, MD  predniSONE (DELTASONE) 20 MG tablet Take 20 mg by mouth daily.    Historical Provider, MD   BP 140/55 mmHg  Pulse 68  Temp(Src) 98.4 F (36.9 C)  Resp 24  Ht 5\' 3"  (1.6 m)  Wt 99.791 kg  BMI 38.98 kg/m2  SpO2 91% Physical Exam  Constitutional: She appears well-developed and well-nourished. No distress.  Awake, alert, nontoxic appearance  HENT:  Head: Normocephalic and atraumatic.  Mouth/Throat: Oropharynx is clear and moist. No oropharyngeal exudate.  Eyes: Conjunctivae are normal. No scleral icterus.  Neck: Normal range of motion. Neck supple.  Cardiovascular: Normal rate, regular rhythm, normal heart sounds and intact distal pulses.   Pulmonary/Chest: Accessory muscle usage present. Tachypnea noted. She is in respiratory distress. She has decreased breath sounds. She has no wheezes. She has rales in the right middle field, the right lower field, the left middle field and the left lower field. She exhibits no tenderness.  Equal chest expansion  Abdominal: Soft. Bowel sounds are normal. She exhibits no mass. There is no tenderness. There is no rebound and no guarding.  Musculoskeletal: Normal range of motion. She exhibits no edema.  Neurological: She is alert.  Speech is clear and goal oriented Moves extremities without ataxia  Skin: Skin is warm and dry. She  is not diaphoretic.  Left middle finger with evidence of ecchymosis, decreased blood flow and small ulceration to the tip of the finger.  Psychiatric: She has a normal mood and affect.  Nursing note and vitals reviewed.   ED Course  Procedures (including critical care time) Labs Review Labs Reviewed  CBC - Abnormal; Notable for the following:    WBC 16.1 (*)    RDW 16.9 (*)    All other components within normal limits  BRAIN NATRIURETIC PEPTIDE - Abnormal; Notable for the following:    B Natriuretic Peptide 267.9 (*)    All other components within normal limits  BASIC METABOLIC PANEL - Abnormal; Notable for the following:    Glucose, Bld 116 (*)    Calcium 8.8 (*)    All other components within normal limits  I-STAT TROPOININ, ED - Abnormal; Notable for the following:    Troponin i, poc 0.28 (*)    All other components within normal limits  CULTURE, BLOOD (ROUTINE X 2)  CULTURE, BLOOD (ROUTINE X 2)  PROCALCITONIN    Imaging Review Dg Chest 2 View  05/01/2015  CLINICAL DATA:  Shortness of breath and hypoxia. EXAM: CHEST  2 VIEW COMPARISON:  03/28/2015 FINDINGS: Shallow inspiration. Cardiac enlargement with increased vascular congestion. Bilateral parenchymal airspace disease, increasing since previous study. This may indicate edema or pneumonia. No blunting of costophrenic angles. No pneumothorax. 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 Degenerative changes in the spine and shoulders. IMPRESSION: Cardiac enlargement with pulmonary vascular congestion. Increasing bilateral airspace infiltrates in the lungs may indicate edema or pneumonia. Electronically Signed   By: Burman Nieves M.D.   On: 2015/05/01 23:05   I have personally reviewed and evaluated these images and lab results as part of my medical decision-making.   EKG Interpretation   Date/Time:  Wednesday May 01, 2015 22:31:48 EST Ventricular Rate:  75 PR Interval:  148 QRS Duration: 98 QT Interval:  400 QTC Calculation: 447 R Axis:   -27 Text Interpretation:  Sinus rhythm Probable left atrial enlargement  Abnormal R-wave progression, late transition Left ventricular hypertrophy  No significant change since last tracing Confirmed by Bebe Shaggy  MD, DONALD  [phone]) on 05/01/15 10:35:27 PM      Accessory Clinical Findings  Echo 03/16/15: Left ventricle: The cavity size was normal. Wall thickness was increased in a pattern of severe LVH. Systolic function was vigorous. The estimated ejection fraction was in the range of 65% to 70%. Wall motion was normal; there were no regional wall motion abnormalities. Doppler parameters are consistent with abnormal left ventricular relaxation (grade 1 diastolic dysfunction). Doppler parameters are consistent with high ventricular filling pressure. - Aortic valve: Valve mobility was restricted. 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 - Mitral valve: Severely calcified annulus. The findings are consistent with mild stenosis. There was mild regurgitation. Valve area by pressure half-time: 1.96 cm^2. - Left atrium: The atrium was moderately dilated. - Pericardium, extracardiac: A trivial pericardial effusion was identified. Impressions: - Vigorous LV function; grade 1 diastolic dysfunction with elevated LV filling pressure; severe LVH; intracavitary gradient of 3.3 m/s; moderate LAE; severe MAC with mild MR and mild MS; moderate AS (mean gradient 20 mmHg).  TEE: 03/21/15: Left ventricle: Severe LVH with more significant focal basal septal hypertrophy suggestive of HOCM. There is subvalvular acceleration and outflow tract obstruction. The estimated ejection fraction was 75%. Wall motion was normal; there were no regional wall motion abnormalities. - Aortic valve: Trileaflet. There is a calcified nodule on the NCC. - Mitral  valve: SAM, mild posteriorly directed regurgitation. Calcified mass on the subvalvular apparatus of the posterior leaflet. - Left atrium: The atrium was dilated. No evidence of thrombus in the atrial cavity or appendage. - Right atrium: No evidence of thrombus in the atrial cavity or appendage. - Atrial septum: No defect or patent foramen ovale was identified. - Tricuspid valve: No evidence of vegetation. - Pulmonic valve: No evidence of vegetation. Impressions: - No endocarditis. Calcified nodules on the NCC of the aortic valve and mitral valve apparatus, with MAC. There is SAM of the anterior leaflet with mild, posteriorly directed MR. There is significant LVOT obstruction (see surface echo) with severe basal septal hypertrophy consistent with HOCM. No aortic stenosis is identified. EF 75%.  Right/Left Heart Cath and Coronary Angiography 03/29/15    Conclusion     Ramus lesion, 20% stenosed.  Mid LAD to Dist LAD lesion, 30% stenosed.  Dist LAD lesion, 40% stenosed.  Ost 2nd  Diag to 2nd Diag lesion, 30% stenosed.  Mid RCA lesion, 20% stenosed.  1. Mild non-obstructive CAD 2. Normal wedge pressure 3. Mean PA pressure  Recommendations: I do not think she needs further diuresis. Medical management of CAD.    Echo 04/03/15 LV EF: 75%  ------------------------------------------------------------------- History:  PMH: F/U Vegetation Mitral Valve Disorder.  ------------------------------------------------------------------- Study Conclusions  - Left ventricle: The cavity size was normal. There was severe focal basal hypertrophy of the septum with moderate hypertrophy of the posterior wall. Consistent with hypertrophic cardiomyopathy. Systolic function was normal. The estimated ejection fraction was 75%. There was dynamic obstruction at restin the mid cavity, with a peak gradient of 9 mm Hg. Wall motion was normal; there  were no regional wall motion abnormalities. Doppler parameters are consistent with abnormal left ventricular relaxation (grade 1 diastolic dysfunction). - Aortic valve: Right coronary and noncoronary cusp mobility was restricted. 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 There was mild regurgitation. Peak velocity (S): 317.44 cm/s. Mean gradient (S): 22 mm Hg. Regurgitation pressure half-time: 772 ms. - Mitral valve: There is a calcified, mobile density on the posterior leaflet of the mitral valve measuring 1.4 cm x 0.6 cm. There is also heavy calcification of the mitral valve annulus, worse posteriorly. Mobility was restricted. The findings are consistent with moderate stenosis. There was mild regurgitation. Pressure half-time: 152 ms. Mean gradient (D): 5 mm Hg. Valve area by pressure half-time: 1.45 cm^2. - Right ventricle: The cavity size was normal. Wall thickness was normal. Systolic function was normal. - Tricuspid valve: Structurally normal valve. There was no regurgitation. - Inferior vena cava: The vessel was normal in size. The respirophasic diameter changes were in the normal range (= 50%), consistent with normal central venous pressure. - Pericardium, extracardiac: A trivial pericardial effusion was identified posterior to the heart.  Impressions:  - On review, echo appears unchanged from 03/16/15. Consider transesophageal echo for better evaluation of the mitral valve.        MDM   Final diagnoses:  Acute on chronic congestive heart failure, unspecified congestive heart failure type (HCC)  SOB (shortness of breath)  Hypoxia  Elevated troponin   Rachel Baker presents with persistent shortness of breath.  His complicated medical history stinging of the last 1 month including CHF, respiratory failure and HOCM along with mitral and aortic stenosis.  Chest x-ray shows worsening vascular congestion and  lateral airspace for strokes which may  indicate edema or pneumonia. Patient is afebrile without signs or symptoms of sepsis. Less likely pneumonia, more likely CHF. Patient also with elevated troponin likely demand ischemia.  Doubt ACS at this time.  Patient will be admitted for diuresis and further monitoring.  She was given Lasix here in the emergency department.  The patient was discussed with and seen by Dr. Bebe Shaggy who agrees with the treatment plan.    Dahlia Client Lois Ostrom, PA-C 04/11/15 0025  Zadie Rhine, MD 04/12/15 1410

## 2015-04-10 NOTE — ED Notes (Signed)
Dr Bebe Shaggy given a copy of troponin results .28

## 2015-04-11 ENCOUNTER — Encounter (HOSPITAL_COMMUNITY): Payer: Self-pay | Admitting: Internal Medicine

## 2015-04-11 DIAGNOSIS — I5031 Acute diastolic (congestive) heart failure: Secondary | ICD-10-CM

## 2015-04-11 DIAGNOSIS — J101 Influenza due to other identified influenza virus with other respiratory manifestations: Secondary | ICD-10-CM | POA: Diagnosis present

## 2015-04-11 DIAGNOSIS — I509 Heart failure, unspecified: Secondary | ICD-10-CM | POA: Diagnosis not present

## 2015-04-11 DIAGNOSIS — J1000 Influenza due to other identified influenza virus with unspecified type of pneumonia: Secondary | ICD-10-CM | POA: Diagnosis present

## 2015-04-11 DIAGNOSIS — R918 Other nonspecific abnormal finding of lung field: Secondary | ICD-10-CM | POA: Insufficient documentation

## 2015-04-11 DIAGNOSIS — J80 Acute respiratory distress syndrome: Secondary | ICD-10-CM | POA: Diagnosis present

## 2015-04-11 DIAGNOSIS — J9600 Acute respiratory failure, unspecified whether with hypoxia or hypercapnia: Secondary | ICD-10-CM | POA: Diagnosis present

## 2015-04-11 DIAGNOSIS — J9601 Acute respiratory failure with hypoxia: Secondary | ICD-10-CM

## 2015-04-11 DIAGNOSIS — I7389 Other specified peripheral vascular diseases: Secondary | ICD-10-CM | POA: Diagnosis present

## 2015-04-11 DIAGNOSIS — E874 Mixed disorder of acid-base balance: Secondary | ICD-10-CM | POA: Diagnosis present

## 2015-04-11 DIAGNOSIS — Z7952 Long term (current) use of systemic steroids: Secondary | ICD-10-CM | POA: Diagnosis not present

## 2015-04-11 DIAGNOSIS — R0602 Shortness of breath: Secondary | ICD-10-CM | POA: Diagnosis present

## 2015-04-11 DIAGNOSIS — G4733 Obstructive sleep apnea (adult) (pediatric): Secondary | ICD-10-CM | POA: Diagnosis present

## 2015-04-11 DIAGNOSIS — I38 Endocarditis, valve unspecified: Secondary | ICD-10-CM | POA: Diagnosis present

## 2015-04-11 DIAGNOSIS — A419 Sepsis, unspecified organism: Secondary | ICD-10-CM | POA: Diagnosis present

## 2015-04-11 DIAGNOSIS — R6521 Severe sepsis with septic shock: Secondary | ICD-10-CM | POA: Diagnosis present

## 2015-04-11 DIAGNOSIS — I059 Rheumatic mitral valve disease, unspecified: Secondary | ICD-10-CM | POA: Diagnosis not present

## 2015-04-11 DIAGNOSIS — R7989 Other specified abnormal findings of blood chemistry: Secondary | ICD-10-CM

## 2015-04-11 DIAGNOSIS — I5023 Acute on chronic systolic (congestive) heart failure: Secondary | ICD-10-CM | POA: Diagnosis not present

## 2015-04-11 DIAGNOSIS — I421 Obstructive hypertrophic cardiomyopathy: Secondary | ICD-10-CM | POA: Diagnosis present

## 2015-04-11 DIAGNOSIS — Z515 Encounter for palliative care: Secondary | ICD-10-CM | POA: Diagnosis not present

## 2015-04-11 DIAGNOSIS — Z881 Allergy status to other antibiotic agents status: Secondary | ICD-10-CM | POA: Diagnosis not present

## 2015-04-11 DIAGNOSIS — I248 Other forms of acute ischemic heart disease: Secondary | ICD-10-CM | POA: Diagnosis present

## 2015-04-11 DIAGNOSIS — I342 Nonrheumatic mitral (valve) stenosis: Secondary | ICD-10-CM | POA: Diagnosis present

## 2015-04-11 DIAGNOSIS — Z888 Allergy status to other drugs, medicaments and biological substances status: Secondary | ICD-10-CM | POA: Diagnosis not present

## 2015-04-11 DIAGNOSIS — Z7982 Long term (current) use of aspirin: Secondary | ICD-10-CM | POA: Diagnosis not present

## 2015-04-11 DIAGNOSIS — I5033 Acute on chronic diastolic (congestive) heart failure: Secondary | ICD-10-CM | POA: Diagnosis present

## 2015-04-11 DIAGNOSIS — Z6841 Body Mass Index (BMI) 40.0 and over, adult: Secondary | ICD-10-CM | POA: Diagnosis not present

## 2015-04-11 DIAGNOSIS — G9340 Encephalopathy, unspecified: Secondary | ICD-10-CM | POA: Diagnosis present

## 2015-04-11 DIAGNOSIS — R739 Hyperglycemia, unspecified: Secondary | ICD-10-CM | POA: Diagnosis not present

## 2015-04-11 DIAGNOSIS — Z87891 Personal history of nicotine dependence: Secondary | ICD-10-CM | POA: Diagnosis not present

## 2015-04-11 DIAGNOSIS — Z880 Allergy status to penicillin: Secondary | ICD-10-CM | POA: Diagnosis not present

## 2015-04-11 DIAGNOSIS — K219 Gastro-esophageal reflux disease without esophagitis: Secondary | ICD-10-CM | POA: Diagnosis present

## 2015-04-11 DIAGNOSIS — N170 Acute kidney failure with tubular necrosis: Secondary | ICD-10-CM | POA: Diagnosis present

## 2015-04-11 DIAGNOSIS — R34 Anuria and oliguria: Secondary | ICD-10-CM | POA: Diagnosis not present

## 2015-04-11 DIAGNOSIS — Z9911 Dependence on respirator [ventilator] status: Secondary | ICD-10-CM | POA: Diagnosis not present

## 2015-04-11 DIAGNOSIS — Z79899 Other long term (current) drug therapy: Secondary | ICD-10-CM | POA: Diagnosis not present

## 2015-04-11 DIAGNOSIS — Z66 Do not resuscitate: Secondary | ICD-10-CM | POA: Diagnosis present

## 2015-04-11 LAB — TROPONIN I
TROPONIN I: 0.35 ng/mL — AB (ref <0.031)
TROPONIN I: 0.25 ng/mL — AB (ref <0.031)
Troponin I: 0.33 ng/mL — ABNORMAL HIGH (ref <0.031)

## 2015-04-11 LAB — COMPREHENSIVE METABOLIC PANEL
ALBUMIN: 2.7 g/dL — AB (ref 3.5–5.0)
ALT: 21 U/L (ref 14–54)
ANION GAP: 15 (ref 5–15)
AST: 37 U/L (ref 15–41)
Alkaline Phosphatase: 63 U/L (ref 38–126)
BUN: 15 mg/dL (ref 6–20)
CO2: 24 mmol/L (ref 22–32)
Calcium: 8.8 mg/dL — ABNORMAL LOW (ref 8.9–10.3)
Chloride: 102 mmol/L (ref 101–111)
Creatinine, Ser: 0.79 mg/dL (ref 0.44–1.00)
GFR calc Af Amer: >60 mL/min (ref >60)
GFR calc non Af Amer: >60 mL/min (ref >60)
GLUCOSE: 92 mg/dL (ref 65–99)
POTASSIUM: 4.3 mmol/L (ref 3.5–5.1)
SODIUM: 141 mmol/L (ref 135–145)
Total Bilirubin: 2.7 mg/dL — ABNORMAL HIGH (ref 0.3–1.2)
Total Protein: 5.3 g/dL — ABNORMAL LOW (ref 6.5–8.1)

## 2015-04-11 LAB — INFLUENZA PANEL BY PCR (TYPE A & B)
H1N1 flu by pcr: NOT DETECTED
INFLAPCR: NEGATIVE
INFLBPCR: POSITIVE — AB

## 2015-04-11 LAB — CBC WITH DIFFERENTIAL/PLATELET
BASOS ABS: 0 10*3/uL (ref 0.0–0.1)
Basophils Relative: 0 %
Eosinophils Absolute: 0.1 10*3/uL (ref 0.0–0.7)
Eosinophils Relative: 0 %
HEMATOCRIT: 40.1 % (ref 36.0–46.0)
Hemoglobin: 12.7 g/dL (ref 12.0–15.0)
LYMPHS PCT: 14 %
Lymphs Abs: 2.1 10*3/uL (ref 0.7–4.0)
MCH: 29.1 pg (ref 26.0–34.0)
MCHC: 31.7 g/dL (ref 30.0–36.0)
MCV: 92 fL (ref 78.0–100.0)
MONO ABS: 0.7 10*3/uL (ref 0.1–1.0)
MONOS PCT: 4 %
NEUTROS ABS: 12.5 10*3/uL — AB (ref 1.7–7.7)
Neutrophils Relative %: 82 %
Platelets: 231 10*3/uL (ref 150–400)
RBC: 4.36 MIL/uL (ref 3.87–5.11)
RDW: 16.9 % — AB (ref 11.5–15.5)
WBC: 15.3 10*3/uL — ABNORMAL HIGH (ref 4.0–10.5)

## 2015-04-11 LAB — I-STAT ARTERIAL BLOOD GAS, ED
Acid-Base Excess: 2 mmol/L (ref 0.0–2.0)
Bicarbonate: 24.9 mEq/L — ABNORMAL HIGH (ref 20.0–24.0)
O2 SAT: 85 %
PCO2 ART: 34.1 mmHg — AB (ref 35.0–45.0)
PH ART: 7.471 — AB (ref 7.350–7.450)
PO2 ART: 46 mmHg — AB (ref 80.0–100.0)
Patient temperature: 98.6
TCO2: 26 mmol/L (ref 0–100)

## 2015-04-11 LAB — LACTIC ACID, PLASMA
Lactic Acid, Venous: 1.6 mmol/L (ref 0.5–2.0)
Lactic Acid, Venous: 2.2 mmol/L (ref 0.5–2.0)

## 2015-04-11 LAB — BRAIN NATRIURETIC PEPTIDE: B Natriuretic Peptide: 267.9 pg/mL — ABNORMAL HIGH (ref 0.0–100.0)

## 2015-04-11 LAB — PROCALCITONIN

## 2015-04-11 LAB — HIV ANTIBODY (ROUTINE TESTING W REFLEX): HIV SCREEN 4TH GENERATION: NONREACTIVE

## 2015-04-11 LAB — SEDIMENTATION RATE: Sed Rate: 34 mm/hr — ABNORMAL HIGH (ref 0–22)

## 2015-04-11 MED ORDER — PREDNISONE 20 MG PO TABS: 40.0000 mg | ORAL_TABLET | Freq: Every day | ORAL | Status: DC

## 2015-04-11 MED ORDER — FUROSEMIDE 10 MG/ML IJ SOLN
40.0000 mg | Freq: Once | INTRAMUSCULAR | Status: AC
  Administered 2015-04-11: 40 mg via INTRAVENOUS
  Filled 2015-04-11: qty 4

## 2015-04-11 MED ORDER — ARFORMOTEROL TARTRATE 15 MCG/2ML IN NEBU
15.0000 ug | INHALATION_SOLUTION | Freq: Two times a day (BID) | RESPIRATORY_TRACT | Status: DC
  Administered 2015-04-11 – 2015-04-12 (×4): 15 ug via RESPIRATORY_TRACT
  Filled 2015-04-11 (×5): qty 2

## 2015-04-11 MED ORDER — GUAIFENESIN ER 600 MG PO TB12
1200.0000 mg | ORAL_TABLET | Freq: Two times a day (BID) | ORAL | Status: DC
  Administered 2015-04-12: 1200 mg via ORAL
  Administered 2015-04-12: 600 mg via ORAL
  Filled 2015-04-11 (×2): qty 2

## 2015-04-11 MED ORDER — METOPROLOL SUCCINATE ER 50 MG PO TB24
50.0000 mg | ORAL_TABLET | Freq: Two times a day (BID) | ORAL | Status: DC
  Administered 2015-04-11: 50 mg via ORAL
  Filled 2015-04-11: qty 2

## 2015-04-11 MED ORDER — DEXTROSE 5 % IV SOLN
2.0000 g | Freq: Three times a day (TID) | INTRAVENOUS | Status: DC
  Administered 2015-04-11 – 2015-04-12 (×5): 2 g via INTRAVENOUS
  Filled 2015-04-11 (×7): qty 2

## 2015-04-11 MED ORDER — ACETAMINOPHEN 650 MG RE SUPP: 650.0000 mg | Freq: Four times a day (QID) | RECTAL | Status: DC | PRN

## 2015-04-11 MED ORDER — PREDNISONE 20 MG PO TABS: 10.0000 mg | ORAL_TABLET | Freq: Every day | ORAL | Status: DC

## 2015-04-11 MED ORDER — METHYLPREDNISOLONE SODIUM SUCC 40 MG IJ SOLR
40.0000 mg | Freq: Four times a day (QID) | INTRAMUSCULAR | Status: DC
  Administered 2015-04-11 – 2015-04-15 (×16): 40 mg via INTRAVENOUS
  Filled 2015-04-11 (×17): qty 1

## 2015-04-11 MED ORDER — SODIUM CHLORIDE 0.9% FLUSH
3.0000 mL | Freq: Two times a day (BID) | INTRAVENOUS | Status: DC
  Administered 2015-04-12 – 2015-04-13 (×3): 3 mL via INTRAVENOUS

## 2015-04-11 MED ORDER — VANCOMYCIN HCL 10 G IV SOLR
1250.0000 mg | Freq: Two times a day (BID) | INTRAVENOUS | Status: DC
  Administered 2015-04-11 – 2015-04-12 (×2): 1250 mg via INTRAVENOUS
  Filled 2015-04-11 (×4): qty 1250

## 2015-04-11 MED ORDER — FUROSEMIDE 10 MG/ML IJ SOLN
40.0000 mg | Freq: Every day | INTRAMUSCULAR | Status: DC
  Administered 2015-04-11: 40 mg via INTRAVENOUS
  Filled 2015-04-11: qty 4

## 2015-04-11 MED ORDER — BUDESONIDE 0.5 MG/2ML IN SUSP
0.5000 mg | Freq: Two times a day (BID) | RESPIRATORY_TRACT | Status: DC
  Administered 2015-04-11 – 2015-04-18 (×14): 0.5 mg via RESPIRATORY_TRACT
  Filled 2015-04-11 (×14): qty 2

## 2015-04-11 MED ORDER — ASPIRIN 81 MG PO CHEW
81.0000 mg | CHEWABLE_TABLET | Freq: Every day | ORAL | Status: DC
  Administered 2015-04-11 – 2015-04-18 (×8): 81 mg via ORAL
  Filled 2015-04-11 (×8): qty 1

## 2015-04-11 MED ORDER — ALBUTEROL SULFATE (2.5 MG/3ML) 0.083% IN NEBU
2.5000 mg | INHALATION_SOLUTION | RESPIRATORY_TRACT | Status: DC | PRN
  Administered 2015-04-11: 2.5 mg via RESPIRATORY_TRACT

## 2015-04-11 MED ORDER — LEVALBUTEROL HCL 0.63 MG/3ML IN NEBU: 0.6300 mg | INHALATION_SOLUTION | Freq: Four times a day (QID) | RESPIRATORY_TRACT | Status: DC

## 2015-04-11 MED ORDER — ACETAMINOPHEN 325 MG PO TABS: 650.0000 mg | ORAL_TABLET | Freq: Four times a day (QID) | ORAL | Status: DC | PRN

## 2015-04-11 MED ORDER — ONDANSETRON HCL 4 MG/2ML IJ SOLN: 4.0000 mg | Freq: Four times a day (QID) | INTRAMUSCULAR | Status: DC | PRN

## 2015-04-11 MED ORDER — ONDANSETRON HCL 4 MG PO TABS: 4.0000 mg | ORAL_TABLET | Freq: Four times a day (QID) | ORAL | Status: DC | PRN

## 2015-04-11 MED ORDER — PANTOPRAZOLE SODIUM 40 MG PO TBEC
40.0000 mg | DELAYED_RELEASE_TABLET | Freq: Two times a day (BID) | ORAL | Status: DC
  Administered 2015-04-11 – 2015-04-12 (×3): 40 mg via ORAL
  Filled 2015-04-11 (×4): qty 1

## 2015-04-11 MED ORDER — OSELTAMIVIR PHOSPHATE 75 MG PO CAPS
75.0000 mg | ORAL_CAPSULE | Freq: Two times a day (BID) | ORAL | Status: DC
  Administered 2015-04-11 – 2015-04-12 (×4): 75 mg via ORAL
  Filled 2015-04-11 (×7): qty 1

## 2015-04-11 MED ORDER — ALPRAZOLAM 0.5 MG PO TABS
0.5000 mg | ORAL_TABLET | Freq: Three times a day (TID) | ORAL | Status: DC | PRN
  Administered 2015-04-12 (×2): 0.5 mg via ORAL
  Filled 2015-04-11 (×2): qty 1

## 2015-04-11 MED ORDER — ALBUTEROL SULFATE (2.5 MG/3ML) 0.083% IN NEBU
2.5000 mg | INHALATION_SOLUTION | Freq: Four times a day (QID) | RESPIRATORY_TRACT | Status: DC
  Administered 2015-04-11 – 2015-04-18 (×26): 2.5 mg via RESPIRATORY_TRACT
  Filled 2015-04-11 (×28): qty 3

## 2015-04-11 MED ORDER — METOPROLOL SUCCINATE ER 50 MG PO TB24
75.0000 mg | ORAL_TABLET | Freq: Two times a day (BID) | ORAL | Status: DC
  Administered 2015-04-11 – 2015-04-12 (×3): 75 mg via ORAL
  Filled 2015-04-11 (×5): qty 1

## 2015-04-11 MED ORDER — VANCOMYCIN HCL 10 G IV SOLR
1500.0000 mg | INTRAVENOUS | Status: AC
  Administered 2015-04-11: 1500 mg via INTRAVENOUS
  Filled 2015-04-11: qty 1500

## 2015-04-11 NOTE — ED Notes (Signed)
Admitting at bedside 

## 2015-04-11 NOTE — ED Notes (Signed)
Pt helped back to bed from bedside commode. Pt's spo2 dropped to 70% with good pleth, pt placed on NRB mask and spo2 up to 97%

## 2015-04-11 NOTE — Consult Note (Signed)
Cardiology Consult    Patient ID: Rachel Baker MRN: [id number], DOB/AGE: 10-07-1950   Admit date: 04/09/2015 Date of Consult: 04/11/2015  Primary Physician: Wenda Low, MD Reason for Consult: CHF; Elevated Troponin Primary Cardiologist: New - Dr. Sallyanne Kuster Requesting Provider: Dr. Grandville Silos   History of Present Illness    Rachel Baker is a 65 y.o. female with past medical history of mild- nonobstructive CAD ( by cath in 03/2015), HOCM, chronic diastolic CHF, and OSA who presents to [facility] ED from [facility] on 05/02/2015 for shortness of breath.  She was recently admitted from 03/15/2015 - 04/04/2015 for acute hypoxic respiratory failure. It was of unclear etiology but thought to be secondary to multifocal PNA. Cardiology was consulted during the admission for elevated troponin and CHF. She was noted to have AS on echo and there was concern for valvular outflow obstruction and endocarditis. A TEE was performed on 03/21/2015 and showed no source for endocarditis. There was mitral valve calcification with subvalvular calcified nodules along with HOCM and SAM. With this, they recommended continue use of a BB but avoidance of vasodilating medications. She underwent cardiac catheterization on 03/29/2015 which showed mild non-obstructive CAD with a normal wedge pressure and mean PA pressure of 19 mmHg. 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 She was discharged to a SNF for rehabilitation.  She reports becoming short of breath yesterday when taking her oxygen off while going to the restroom and changing clothes. She has been on 4L [place] since time of discharge. She denies any recent episodes of chest pain, palpitations, orthopnea, or PND. She reports feeling well prior to yesterday's episode of dyspnea.  While in the ED,  cyclic troponin values have been 0.33 and 0.35 (down-trending from 0.91 three weeks ago). Creatinine 0.78. WBC 16.1. Hgb 12.9. Platelets 248. BNP 267. Lactic Acid 2.2. CXR showing cardiac enlargement with pulmonary vascular congestion and increasing bilateral airspace infiltrates which may indicate edema or PNA. Her oxygen saturations were in the 80's on arrival and she has been on a NRB. She attempted to titrate down to 4L [place] today but her saturations went into the 80's and she was placed back on the NRB.  She has been started on Vancomycin and Aztreonam for possible PNA. She received a breathing treatment this morning and says it "heldped tremendously". She has been given 2 doses of 34m IV Lasix with a recorded output of  -3.0L. Her weight at time of admission was 220 lbs, which is down from her discharge weight of 226 lbs on 04/04/2015.  Past Medical History   Past Medical History  Diagnosis Date  . CAP (community acquired pneumonia) 03/15/2015  . Family history of adverse reaction to anesthesia     "my mother got combative"  . Complication of anesthesia     "I hallucinated when I had teeth worked on"  . Sleep apnea     "need breath rite strip sometimes" (03/15/2015)  . History of blood transfusion     "w/tooth implant"  . GERD (gastroesophageal reflux disease)   . Ocular migraine     "all the time" (03/15/2015)  . Arthritis     "all over; different joints" (03/15/2015)    Past Surgical History  Procedure Laterality Date  . Carpal tunnel release Bilateral   . Cataract extraction w/ intraocular lens  implant, bilateral Bilateral 2000s  . Mouth surgery      dental implant  . Cardiac catheterization N/A 03/29/2015    Procedure: Right/Left Heart Cath and Coronary Angiography;  Surgeon: Burnell Blanks, MD;  Location: [place] [facility];  Service: Cardiovascular;  Laterality: N/A;     Allergies  Allergies  Allergen Reactions  . Penicillins Hives and Itching    Has patient had a  PCN reaction causing immediate rash, facial/tongue/throat swelling, SOB or lightheadedness with hypotension: Yes Has patient had a PCN reaction causing severe rash involving mucus membranes or skin necrosis: No Has patient had a PCN reaction that required hospitalization No Has patient had a PCN reaction occurring within the last 10 years: No If all of the above answers are "NO", then may proceed with Cephalosporin use.   . Sulfa Antibiotics Hives and Itching  . Ceclor [Cefaclor] Itching and Rash    Inpatient Medications    . arformoterol  15 mcg Nebulization BID  . aspirin  81 mg Oral Daily  . budesonide (PULMICORT) nebulizer solution  0.5 mg Nebulization BID  . furosemide  40 mg Intravenous Daily  . methylPREDNISolone (SOLU-MEDROL) injection  40 mg Intravenous Q6H  . metoprolol succinate  50 mg Oral BID  . pantoprazole  40 mg Oral BID AC  . sodium chloride flush  3 mL Intravenous Q12H    Family History    Family History  Problem Relation Age of Onset  . Diabetes Mellitus II Neg Hx     Social History    Social History   Social History  . Marital Status: Single    Spouse Name: N/A  . Number of Children: N/A  . Years of Education: N/A   Occupational History  . Not on file.   Social History Main Topics  . Smoking status: Former Smoker -- 0.33 packs/day for 29 years    Types: Cigarettes  . Smokeless tobacco: Never Used     Comment: "quit smoking in ~ 1999  . Alcohol Use: Yes     Comment: 03/15/2015 "glass of wine a couple times/month; mixed drink maybe once/month"  . Drug Use: No  . Sexual Activity: Not Currently   Other Topics Concern  . Not on file   Social History Narrative     Review of Systems    General:  No chills, fever, night sweats or weight changes.  Cardiovascular:  No chest pain, dyspnea on exertion, edema, orthopnea, palpitations, paroxysmal nocturnal dyspnea. Dermatological: No rash, lesions/masses Respiratory: No cough, Positive for  dyspnea Urologic: No hematuria, dysuria Abdominal:   No nausea, vomiting, diarrhea, bright red blood per rectum, melena, or hematemesis Neurologic:  No visual changes, wkns, changes in mental status. All other systems reviewed and are otherwise negative except as noted above.  Physical Exam    Blood pressure 134/82, pulse 85, temperature 98.4 F (36.9 C), resp. rate 31, height _0  (1.6 m), weight 220 lb 6.4 oz (99.973 kg), SpO2 91 %.  General: Pleasant, overweight Caucasian female appearing in NAD. Currently using NRB. Psych: Normal affect. Neuro: Alert and oriented X 3. Moves all extremities spontaneously. HEENT: Normal  Neck: Supple without bruits or JVD. Lungs:  Resp regular and unlabored, mild rhonchi throughout lung fields. No wheezing or rales appreciated. Heart: RRR no s3, s4, 2/6 SEM at RUSB. Abdomen: Soft, non-tender, non-distended, BS + x 4.  Extremities: No clubbing, cyanosis or edema. DP/PT/Radials 2+ and equal bilaterally.  Labs    Troponin [facility] Test)  Recent Labs  04/03/2015 2319  TROPIPOC 0.28*    Recent Labs  04/11/15 0150 04/11/15 0738  TROPONINI 0.33* 0.35*   Lab Results  Component Value Date   WBC 15.3* 04/11/2015   HGB 12.7 04/11/2015   HCT 40.1 04/11/2015   MCV 92.0 04/11/2015   PLT 231 04/11/2015    Recent Labs Lab 04/11/15 0438  NA 141  K 4.3  CL 102  CO2 24  BUN 15  CREATININE 0.79  CALCIUM 8.8*  PROT 5.3*  BILITOT 2.7*  ALKPHOS 63  ALT 21  AST 37  GLUCOSE 92   Lab Results  Component Value Date   TRIG 176* 03/23/2015   No results found for: [facility]   Radiology Studies    Dg Chest 2 View: 04/16/2015  CLINICAL DATA:  Shortness of breath and hypoxia. EXAM: CHEST  2 VIEW COMPARISON:  03/28/2015 FINDINGS: Shallow inspiration. Cardiac enlargement with increased vascular congestion. Bilateral parenchymal airspace disease, increasing since previous study. This may indicate edema or pneumonia. No blunting of costophrenic  angles. No pneumothorax. 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 Degenerative changes in the spine and shoulders. IMPRESSION: Cardiac enlargement with pulmonary vascular congestion. Increasing bilateral airspace infiltrates in the lungs may indicate edema or pneumonia. Electronically Signed   By: Lucienne Capers M.D.   On: 04/25/2015 23:05     EKG & Cardiac Imaging    EKG: NSR, HR 75, LVH, No acute ST or T-wave changes.  Echocardiogram: 04/03/2015 Study Conclusions - Left ventricle: The cavity size was normal. There was severe  focal basal hypertrophy of the septum with moderate hypertrophy  of the posterior wall. Consistent with hypertrophic  cardiomyopathy. Systolic function was normal. The estimated  ejection fraction was 75%. There was dynamic obstruction at  restin the mid cavity, with a peak gradient of 9 mm Hg. Wall  motion was normal; there were no regional wall motion  abnormalities. Doppler parameters are consistent with abnormal  left ventricular relaxation (grade 1 diastolic dysfunction). - Aortic valve: Right coronary and noncoronary cusp mobility was  restricted. 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 There was mild  regurgitation. Peak velocity (S): 317.44 cm/s. Mean gradient (S):  22 mm Hg. Regurgitation pressure half-time: 772 ms. - Mitral valve: There is a calcified, mobile density on the  posterior leaflet of the mitral valve measuring 1.4 cm x 0.6 cm.  There is also heavy calcification of the mitral valve annulus,  worse posteriorly. Mobility was restricted. The findings are  consistent with moderate stenosis. There was mild regurgitation.  Pressure half-time: 152 ms. Mean gradient (D): 5 mm Hg. Valve  area by pressure half-time: 1.45 cm^2. - Right ventricle: The cavity size was normal. Wall thickness was  normal. Systolic function was  normal. - Tricuspid valve: Structurally normal valve. There was no  regurgitation. - Inferior vena cava: The vessel was normal in size. The  respirophasic diameter changes were in the normal range (= 50%),  consistent with normal central venous pressure. - Pericardium, extracardiac: A trivial pericardial effusion was  identified posterior to the heart.  Impressions: - On review, echo appears unchanged from 03/16/15. Consider  transesophageal echo for better evaluation of the mitral valve.   Cardiac Catheterization: 03/29/2015  [place] lesion, 20% stenosed.  Mid LAD to Dist LAD lesion, 30% stenosed.  Dist LAD lesion, 40% stenosed.  Ost 2nd Diag to 2nd Diag lesion, 30% stenosed.  Mid RCA lesion, 20%  stenosed.  1. Mild non-obstructive CAD 2. Normal wedge pressure 3. Mean PA pressure 23mHg  Recommendations: I do not think she needs further diuresis. Medical management of CAD.   Assessment & Plan    1. Acute Hypoxic Respiratory Failure - of unknown etiology. Had been doing well until she "went too long without wearing her nasal cannula while using the restroom and changing clothes" on 04/09/2015. Denies any recent orthopnea, PND, or lower extremity edema. - BNP mildly elevated to 267 on admission. Received two doses of 437mIV Lasix with net output of -3.0L. Weight was 226 lbs at time of discharge on 04/04/2015, down to 220 lbs at time of admission. She does not appear overly volume overloaded on physical exam. Can likely d/c IV Lasix.  - CXR on admission showed increasing bilateral airspace infiltrates which may indicate edema or PNA. She has been started on Vancomycin and Aztreonam for possible PNA per the admitting team. She reported "significant improvement" in her breathing following her nebulizer treatment this morning. - Pulmonology following. There is concern for inflammatory or interstitial lung disease. Her Sed rate had been elevated to 96 last admission, improved to 34  this admission.  2. Elevated Troponin - cyclic troponin values have been 0.33 and 0.35 (down-trending from 0.91 three weeks ago). - EKG without acute changes. She denies any recent chest pain.  - had recent cardiac catheterization on 03/29/2015 which showed mild non-obstructive CAD. - No further ischemic evaluation indicated at this time with her recent cath results.    3. HOCM - severe focal basal hypertrophy of the septum with moderate hypertrophy of the posterior wall was noted on recent echo last admission. EF was 75%.  - Continue BB. Will avoid vasodilating medications.  4. Mitral Valve Nodule - recommended repeat TEE in 4-6 months   Signed, BrErma HeritagePA-C 04/11/2015, 12:28 PM Pager: [phone] examined chart reviewed. Discussed care with Daughter.  Loud murmur on exam with accentuation Valsalva consistent with HOCM.  Basilar crackles and exp wheezing ESR 96 a few weeks ago improved With steroids  BNP only in 200 range and patient had improvement with breathing Rx.  This primarily appears to be a more active pulmonary issue. Would not over diurese as this will make LVOT gradient worse. Increase beta blocker To maximize diastolic filling time.  Elevated troponin with no significance recent cath with no CAD  PeJenkins Rouge

## 2015-04-11 NOTE — Consult Note (Signed)
Name: Rachel Baker MRN: [id number] DOB: 05-31-1950    ADMISSION DATE:  04/08/2015 CONSULTATION DATE:  04/11/15  REFERRING MD :  Hal Hope  CHIEF COMPLAINT:  SOB   HISTORY OF PRESENT ILLNESS:  Rachel Baker is a 65 y.o. female with a PMH as outlined below.   She had recent extensive hospitalization 03/15/15 through 04/04/15 for CAP with acute hypoxic respiratory failure.  She had echo during that admission that showed HOCM physiology.  She later developed a purple lesion on the tip of her R middle finger with concern for embolic source.  Despite diuresis and broad spectrum abx she had worsening CXR and respiratory status. On 2/15 she had worsening hypoxia and increased WOB, PCCM was consulted for ICU tx and she ultimately required intubation. TEE on 2/16 was not suggestive on endocarditis but revealed nodules on mitral and aortic valves with possible embolization. Mitral valve mass was again seen on repeat echo. Cardiology has followed closely and feels this may be old vegetation vs slow-growing cardiac tumor/fibroblastoma. She will need to follow closely with cardiology and likely needs repeat TEE in 4-6 months. She underwent R and L heart cath on 2/24 which revealed non-obstructive CAD, with normal wedge and PA pressures. She was continued on broad spectrum abx and started on empiric steroids. Cultures remain negative to date and infectious w/u overall remains negative. Her abx were stopped 2/14. Autoimmune w/u revealed a POS RA, pos CCP, however this was discussed with rheumatology who felt this was unlikely to be related to rheumatoid. She was extubated 2/19 and continued to improve slowly although no clear etiology had been defined. She was discharged to [facility] 04/04/15 and was instructed to follow up with cardiology and rheumatology as well as potentially hand surgery.   -------------------------------------------------------------------------------------------------------------------------------------------------------------------------------------------------------- On 03/08, she presented to [facility] ED again with SOB that had began roughly 1.5 - 2 hours prior.  She had taken off her oxygen to use the restroom and change clothes but felt she may have left the oxygen off for too long.  In ED, she was hypoxic and required increase in supplemental O2 from 4L to 5L.  Her CXR showed slightly increased bilateral airspace disease (she had same CXR results during her prior admission).  She was admitted by [facility] and PCCM was consulted.  She states that since discharge, she has not had any fevers/chills/sweats, chest pain, N/V/D, abd pain, myalgias.  She has had mild cough occasionally productive of clear sputum.  This evening she had one episode of blood tinged sputum.   PAST MEDICAL HISTORY :   has a past medical history of CAP (community acquired pneumonia) (03/15/2015); Family history of adverse reaction to anesthesia; Complication of anesthesia; Sleep apnea; History of blood transfusion; GERD (gastroesophageal reflux disease); Ocular migraine; and Arthritis.  has past surgical history that includes Carpal tunnel release (Bilateral); Cataract extraction w/ intraocular lens  implant, bilateral (Bilateral, 2000s); Mouth surgery; and Cardiac catheterization (N/A, 03/29/2015). Prior to Admission medications   Medication Sig Start Date End Date Taking? Authorizing Provider  ALPRAZolam Duanne Moron) 0.5 MG tablet Take 1 tablet (0.5 mg total) by mouth 3 (three) times daily as needed for anxiety. 04/04/15  Yes Marijean Heath, NP  alum & mag hydroxide-simeth (MAALOX/MYLANTA) 200-200-20 MG/5ML suspension Take 15 mLs by mouth every 4 (four) hours as needed for indigestion or heartburn. 04/04/15  Yes Marijean Heath, NP  aspirin 81 MG chewable tablet Chew 1 tablet (81 mg total) by mouth  daily. 04/04/15  Yes Cristal Ford  Whiteheart, NP  metoprolol succinate (TOPROL-XL) 50 MG 24 hr tablet Take 50 mg by mouth 2 (two) times daily. Take with or immediately following a meal.   Yes Historical Provider, MD  pantoprazole (PROTONIX) 40 MG tablet Take 1 tablet (40 mg total) by mouth 2 (two) times daily before a meal. 04/04/15  Yes Marijean Heath, NP  predniSONE (DELTASONE) 10 MG tablet Take 10 mg by mouth daily. Stop date 04/12/15   Yes Historical Provider, MD  metoprolol (LOPRESSOR) 50 MG tablet Take 1 tablet (50 mg total) by mouth 2 (two) times daily. Patient not taking: Reported on 04/11/2015 04/04/15   Marijean Heath, NP  predniSONE (DELTASONE) 10 MG tablet Take 10 mg by mouth daily. Stop date 04/20/15    Historical Provider, MD  predniSONE (DELTASONE) 20 MG tablet Take 20 mg by mouth daily.    Historical Provider, MD   Allergies  Allergen Reactions  . Penicillins Hives and Itching    Has patient had a PCN reaction causing immediate rash, facial/tongue/throat swelling, SOB or lightheadedness with hypotension: Yes Has patient had a PCN reaction causing severe rash involving mucus membranes or skin necrosis: No Has patient had a PCN reaction that required hospitalization No Has patient had a PCN reaction occurring within the last 10 years: No If all of the above answers are "NO", then may proceed with Cephalosporin use.   . Sulfa Antibiotics Hives and Itching  . Ceclor [Cefaclor] Itching and Rash    FAMILY HISTORY:  family history is negative for Diabetes Mellitus II. SOCIAL HISTORY:  reports that she has quit smoking. Her smoking use included Cigarettes. She has a 9.57 pack-year smoking history. She has never used smokeless tobacco. She reports that she drinks alcohol. She reports that she does not use illicit drugs.  REVIEW OF SYSTEMS:   All negative; except for those that are bolded, which indicate positives.  Constitutional: weight loss, weight gain, night sweats, fevers,  chills, fatigue, weakness.  HEENT: headaches, sore throat, sneezing, nasal congestion, post nasal drip, difficulty swallowing, tooth/dental problems, visual complaints, visual changes, ear aches. Neuro: difficulty with speech, weakness, numbness, ataxia. CV:  chest pain, orthopnea, PND, swelling in lower extremities, dizziness, palpitations, syncope.  Resp: cough, hemoptysis, dyspnea, wheezing. GI  heartburn, indigestion, abdominal pain, nausea, vomiting, diarrhea, constipation, change in bowel habits, loss of appetite, hematemesis, melena, hematochezia.  GU: dysuria, change in color of urine, urgency or frequency, flank pain, hematuria. MSK: joint pain or swelling, decreased range of motion. Psych: change in mood or affect, depression, anxiety, suicidal ideations, homicidal ideations. Skin: rash, itching, bruising.    SUBJECTIVE: Breathing is fine at rest, feels SOB with any exertion.  VITAL SIGNS: Temp:  [98.4 F (36.9 C)] 98.4 F (36.9 C) (03/08 2232) Pulse Rate:  [63-70] 68 (03/09 0015) Resp:  [17-31] 24 (03/09 0015) BP: (133-147)/(55-73) 140/55 mmHg (03/09 0015) SpO2:  [91 %-95 %] 91 % (03/09 0015) Weight:  [99.791 kg (220 lb)] 99.791 kg (220 lb) (03/08 2227)  PHYSICAL EXAMINATION: General: Adult female, resting in bed, in NAD. Neuro: A&O x 3, non-focal.  HEENT: [place]/AT. PERRL, sclerae anicteric. Cardiovascular: RRR, no M/R/G.  Lungs: Respirations even and unlabored.  Coarse crackles bilaterally. Abdomen: Obese, BS x 4, soft, NT/ND.  Musculoskeletal: No gross deformities, no edema.  Skin: Intact, warm, no rashes.     Recent Labs Lab 04/15/2015 2305  NA 139  K 3.9  CL 103  CO2 24  BUN 14  CREATININE 0.78  GLUCOSE 116*  Recent Labs Lab 04/21/2015 2305  HGB 12.9  HCT 39.3  WBC 16.1*  PLT 248   Dg Chest 2 View  04/29/2015  CLINICAL DATA:  Shortness of breath and hypoxia. EXAM: CHEST  2 VIEW COMPARISON:  03/28/2015 FINDINGS: Shallow inspiration. Cardiac  enlargement with increased vascular congestion. Bilateral parenchymal airspace disease, increasing since previous study. This may indicate edema or pneumonia. No blunting of costophrenic angles. No pneumothorax. 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 Degenerative changes in the spine and shoulders. IMPRESSION: Cardiac enlargement with pulmonary vascular congestion. Increasing bilateral airspace infiltrates in the lungs may indicate edema or pneumonia. Electronically Signed   By: Lucienne Capers M.D.   On: 04/30/2015 23:05    STUDIES:  CXR 03/09 > slightly increased bilateral airspace disease compared to prior study.  SIGNIFICANT EVENTS  03/09 > re-admitted with acute on chronic hypoxic respiratory failure.  ASSESSMENT / PLAN:  Acute on chronic hypoxic respiratory failure - unclear etiology despite extensive workup last admission.Marland Kitchen ?Multifocal Pneumonia vs. Pulmonary Edema vs. Autoimmune Process -- ESR 96 (03/20/15), RF pos. Also ?Atypical Pneumonia - ? Mycobacterium, ? Fungal. Doubt eosinophilic PNA Plan: Continue supplemental O2 as needed to maintain SpO2 > 92%. Continue BD's, steroids (increase dose from 40m to 430m. Pulmonary hygiene. On empiric abx - consider d/c if PCT remains low (note, infectious workup during her previous admission was completely negative). Follow CXR (would expect very gradual resolution of her bilateral airspace disease).  Mitral and aortic valve nodules - as seen on echo last admission. Hx HOCM, dCHF. Plan: Outpatient follow up with cards.  Rest per primary team.   RaMontey HoraPASouth Millsulmonary & Critical Care Medicine Pager: [phone] ([phone]/10/2015, 2:18 AM

## 2015-04-11 NOTE — ED Notes (Signed)
Unable to get second IV site or second set of cultures due to difficulty stick.

## 2015-04-11 NOTE — Progress Notes (Signed)
I have seen and assessed patient and agree with Dr Katherene Ponto assessment and plan.Patient presenting with acute respiratory distress secondary to influenza B, probable PNA and ??CHF exac. Patient has been seen by PCCM and Cardiology and recommending limiting diuretics.Will place patient on tamiflu. Continue nebs.

## 2015-04-11 NOTE — Progress Notes (Signed)
Pt resting comfortably, Awaiting SCDs, and Vanc IV antibiotic. RN to give breathing treatment, and try to wean patient to 4-6L

## 2015-04-11 NOTE — ED Notes (Signed)
Attempted report 

## 2015-04-11 NOTE — ED Notes (Signed)
Pt escorted to bedside commode to urinate and back into bed by this RN. Pt's Spo2 level at 85% on 4L [place]

## 2015-04-11 NOTE — Progress Notes (Signed)
Pharmacy Antibiotic Note  Rachel Baker is a 65 y.o. female admitted on Apr 22, 2015 with pneumonia.  Pharmacy has been consulted for Vancomycin and Aztreonam dosing x 8 days.  Plan: Continue Aztreonam 1gm IV q8h as ordered x 8 days Vancomycin 1500mg  IV now then 1250mg  IV q12h x 8 days Will f/u renal function, micro data, and pt's clinical condition Vanc trough prn  Height: 5\' 3"  (160 cm) Weight: 220 lb (99.791 kg) IBW/kg (Calculated) : 52.4  Temp (24hrs), Avg:98.4 F (36.9 C), Min:98.4 F (36.9 C), Max:98.4 F (36.9 C)   Recent Labs Lab 2015/04/22 2305  WBC 16.1*  CREATININE 0.78    Estimated Creatinine Clearance: 79 mL/min (by C-G formula based on Cr of 0.78).    Allergies  Allergen Reactions  . Penicillins Hives and Itching    Has patient had a PCN reaction causing immediate rash, facial/tongue/throat swelling, SOB or lightheadedness with hypotension: Yes Has patient had a PCN reaction causing severe rash involving mucus membranes or skin necrosis: No Has patient had a PCN reaction that required hospitalization No Has patient had a PCN reaction occurring within the last 10 years: No If all of the above answers are "NO", then may proceed with Cephalosporin use.   . Sulfa Antibiotics Hives and Itching  . Ceclor [Cefaclor] Itching and Rash    Antimicrobials this admission: 3/9  Vanc >> 3/17 3/9 Aztreonam >>3/17   Dose adjustments this admission: n/a  Microbiology results: pending  Thank you for allowing pharmacy to be a part of this patient's care.  5/9, PharmD, BCPS Clinical pharmacist, pager [phone] 04/11/2015 1:31 AM

## 2015-04-11 NOTE — ED Notes (Signed)
Pt stated "I want to take the nonrebreather off and see if I can go without it" This RN removed the NRB mask and the pt's SPOw fell to 80% on 4L [place]. NRB mask back in place now and SP02 up to 93%

## 2015-04-11 NOTE — H&P (Signed)
[facility] History and Physical  [name] JKD:[id number] DOB: 20-Jul-1950 DOA: 2015-04-30  Referring physician: Ms.Muthersbaugh. PCP: Georgann Housekeeper, MD  Specialists: [facility] cardiology.  Chief Complaint: Shortness of breath.  HPI: Rachel Baker is a 65 y.o. female who was recently admitted last month for shortness of breath and at that time patient also was intubated for respiratory failure 2-D echo showed EF of 75% with vegetations in the mitral valve and transesophageal echocardiogram showed HOCM features was eventually discharged to rehabilitation and was brought to the ER last night after patient acutely became short of breath. Patient states she was on the commode following which she stood up and got acutely short of breath. Denies any chest pain or productive cough. Chest x-ray shows infiltrates versus edema. Patient is afebrile. During last admission patient's cause for respiratory failure was not clear. Sedimentation rate was markedly elevated and at discharge patient was referred to rheumatologist. In the ER patient was short of breath and was given 1 dose of Lasix. Patient will be admitted to stepdown for further management of acute respiratory failure.   Review of Systems: As presented in the history of presenting illness, rest negative.  Past Medical History  Diagnosis Date  . CAP (community acquired pneumonia) 03/15/2015  . Family history of adverse reaction to anesthesia     "my mother got combative"  . Complication of anesthesia     "I hallucinated when I had teeth worked on"  . Sleep apnea     "need breath rite strip sometimes" (03/15/2015)  . History of blood transfusion     "w/tooth implant"  . GERD (gastroesophageal reflux disease)   . Ocular migraine     "all the time" (03/15/2015)  . Arthritis     "all over; different joints" (03/15/2015)   Past Surgical History  Procedure Laterality Date  . Carpal tunnel release Bilateral   . Cataract extraction  w/ intraocular lens  implant, bilateral Bilateral 2000s  . Mouth surgery      dental implant  . Cardiac catheterization N/A 03/29/2015    Procedure: Right/Left Heart Cath and Coronary Angiography;  Surgeon: Kathleene Hazel, MD;  Location: [facility] INVASIVE CV LAB;  Service: Cardiovascular;  Laterality: N/A;   Social History:  reports that she has quit smoking. Her smoking use included Cigarettes. She has a 9.57 pack-year smoking history. She has never used smokeless tobacco. She reports that she drinks alcohol. She reports that she does not use illicit drugs. Where does patient live home. Presently in rehabilitation. Can patient participate in ADLs? Yes.  Allergies  Allergen Reactions  . Penicillins Hives and Itching    Has patient had a PCN reaction causing immediate rash, facial/tongue/throat swelling, SOB or lightheadedness with hypotension: Yes Has patient had a PCN reaction causing severe rash involving mucus membranes or skin necrosis: No Has patient had a PCN reaction that required hospitalization No Has patient had a PCN reaction occurring within the last 10 years: No If all of the above answers are "NO", then may proceed with Cephalosporin use.   . Sulfa Antibiotics Hives and Itching  . Ceclor [Cefaclor] Itching and Rash    Family History:  Family History  Problem Relation Age of Onset  . Diabetes Mellitus II Neg Hx       Prior to Admission medications   Medication Sig Start Date End Date Taking? Authorizing Provider  ALPRAZolam Prudy Feeler) 0.5 MG tablet Take 1 tablet (0.5 mg total) by mouth 3 (three) times daily as needed for  anxiety. 04/04/15  Yes Bernadene Person, NP  alum & mag hydroxide-simeth (MAALOX/MYLANTA) 200-200-20 MG/5ML suspension Take 15 mLs by mouth every 4 (four) hours as needed for indigestion or heartburn. 04/04/15  Yes Bernadene Person, NP  aspirin 81 MG chewable tablet Chew 1 tablet (81 mg total) by mouth daily. 04/04/15  Yes Bernadene Person, NP   metoprolol succinate (TOPROL-XL) 50 MG 24 hr tablet Take 50 mg by mouth 2 (two) times daily. Take with or immediately following a meal.   Yes Historical Provider, MD  pantoprazole (PROTONIX) 40 MG tablet Take 1 tablet (40 mg total) by mouth 2 (two) times daily before a meal. 04/04/15  Yes Bernadene Person, NP  predniSONE (DELTASONE) 10 MG tablet Take 10 mg by mouth daily. Stop date 04/12/15   Yes Historical Provider, MD  metoprolol (LOPRESSOR) 50 MG tablet Take 1 tablet (50 mg total) by mouth 2 (two) times daily. Patient not taking: Reported on 04/11/2015 04/04/15   Bernadene Person, NP  predniSONE (DELTASONE) 10 MG tablet Take 10 mg by mouth daily. Stop date 04/20/15    Historical Provider, MD  predniSONE (DELTASONE) 20 MG tablet Take 20 mg by mouth daily.    Historical Provider, MD    Physical Exam: Filed Vitals:   04/08/2015 2330 04/30/2015 2345 04/11/15 0000 04/11/15 0015  BP: 133/58 140/58 147/73 140/55  Pulse: 63   68  Temp:      Resp: 29 31 21 24   Height:      Weight:      SpO2: 95%   91%     General:  Moderately built and nourished.  Eyes: Anicteric no pallor.  ENT: No discharge from the ears eyes nose or mouth.  Neck: JVD elevated. No mass felt.  Cardiovascular: S1 and S2 heard.  Respiratory: No rhonchi or crepitations.  Abdomen: Soft nontender bowel sounds present.  Skin: No rash.  Musculoskeletal: No edema. Right hand middle finger is discolored.  Psychiatric: Appears normal.  Neurologic: Alert awake oriented to time place and person. Moves all extremities.  Labs on Admission:  Basic Metabolic Panel:  Recent Labs Lab 04/20/2015 2305  NA 139  K 3.9  CL 103  CO2 24  GLUCOSE 116*  BUN 14  CREATININE 0.78  CALCIUM 8.8*   Liver Function Tests: No results for input(s): AST, ALT, ALKPHOS, BILITOT, PROT, ALBUMIN in the last 168 hours. No results for input(s): LIPASE, AMYLASE in the last 168 hours. No results for input(s): AMMONIA in the last 168  hours. CBC:  Recent Labs Lab 04/18/2015 2305  WBC 16.1*  HGB 12.9  HCT 39.3  MCV 91.4  PLT 248   Cardiac Enzymes: No results for input(s): CKTOTAL, CKMB, CKMBINDEX, TROPONINI in the last 168 hours.  BNP (last 3 results)  Recent Labs  03/20/15 1333 03/25/15 0457 04/15/2015 2315  BNP 98.0 230.9* 267.9*    ProBNP (last 3 results) No results for input(s): PROBNP in the last 8760 hours.  CBG: No results for input(s): GLUCAP in the last 168 hours.  Radiological Exams on Admission: Dg Chest 2 View  04/17/2015  CLINICAL DATA:  Shortness of breath and hypoxia. EXAM: CHEST  2 VIEW COMPARISON:  03/28/2015 FINDINGS: Shallow inspiration. Cardiac enlargement with increased vascular congestion. Bilateral parenchymal airspace disease, increasing since previous study. This may indicate edema or pneumonia. No blunting of costophrenic angles. No pneumothorax. 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 Degenerative changes in the spine and shoulders. IMPRESSION: Cardiac enlargement with pulmonary vascular congestion. Increasing bilateral airspace infiltrates in the lungs may indicate edema or pneumonia. Electronically Signed   By: Burman Nieves M.D.   On: 04/18/2015 23:05    EKG: Independently reviewed. Normal sinus rhythm.  Assessment/Plan Principal Problem:   Acute respiratory failure with hypoxemia (HCC) Active Problems:   Acute diastolic heart failure (HCC)   Acrocyanosis (HCC)   HOCM (hypertrophic obstructive cardiomyopathy) (HCC)   Mitral valve mass   Acute respiratory failure (HCC)   1. Acute respiratory failure with hypoxia - cause not clear. Probably secondary to acute diastolic CHF. Patient was given Lasix 40 mg IV and I have continued patient on Lasix 40 mg IV daily. During recent admission patient's shortness of breath cough was not. Sedimentation rate was high I have  ordered repeat sedimentation rate and I have also consulted pulmonary critical care. At this time we will obtain blood cultures procalcitonin of a lactic acid level troponin levels and for now I will keep patient on empiric antibiotics. Echocardiogram done during last admission showed mitral valve vegetations and also EF of 70%. 2. Acrocyanosis - cause not clear. Patient's right hand middle finger is discolored. Follow cultures. This has happened during last admission also. 3. Mitral valve mass per echocardiogram - being followed as outpatient with cardiologist.   DVT Prophylaxis Lovenox.  Code Status: Full code.  Family Communication: Discussed with patient's daughter.  Disposition Plan: Admit to inpatient.    Beck Cofer N. [facility] Pager [phone].  If 7PM-7AM, please contact night-coverage [email or web address] Password [email or web address] 04/11/2015, 1:29 AM

## 2015-04-12 DIAGNOSIS — I059 Rheumatic mitral valve disease, unspecified: Secondary | ICD-10-CM

## 2015-04-12 LAB — CBC
HCT: 40.7 % (ref 36.0–46.0)
Hemoglobin: 12.9 g/dL (ref 12.0–15.0)
MCH: 29.2 pg (ref 26.0–34.0)
MCHC: 31.7 g/dL (ref 30.0–36.0)
MCV: 92.1 fL (ref 78.0–100.0)
PLATELETS: 298 10*3/uL (ref 150–400)
RBC: 4.42 MIL/uL (ref 3.87–5.11)
RDW: 17.2 % — ABNORMAL HIGH (ref 11.5–15.5)
WBC: 16.8 10*3/uL — ABNORMAL HIGH (ref 4.0–10.5)

## 2015-04-12 LAB — BLOOD GAS, ARTERIAL
Acid-Base Excess: 2.1 mmol/L — ABNORMAL HIGH (ref 0.0–2.0)
Bicarbonate: 26 mEq/L — ABNORMAL HIGH (ref 20.0–24.0)
DRAWN BY: 244861
FIO2: 1
O2 SAT: 89.4 %
Patient temperature: 97.7
TCO2: 27.2 mmol/L (ref 0–100)
pCO2 arterial: 38.9 mmHg (ref 35.0–45.0)
pH, Arterial: 7.438 (ref 7.350–7.450)
pO2, Arterial: 55.3 mmHg — ABNORMAL LOW (ref 80.0–100.0)

## 2015-04-12 LAB — BASIC METABOLIC PANEL
Anion gap: 17 — ABNORMAL HIGH (ref 5–15)
BUN: 20 mg/dL (ref 6–20)
CALCIUM: 8.9 mg/dL (ref 8.9–10.3)
CO2: 21 mmol/L — AB (ref 22–32)
CREATININE: 0.94 mg/dL (ref 0.44–1.00)
Chloride: 101 mmol/L (ref 101–111)
GFR calc Af Amer: >60 mL/min (ref >60)
GFR calc non Af Amer: >60 mL/min (ref >60)
GLUCOSE: 149 mg/dL — AB (ref 65–99)
Potassium: 4.3 mmol/L (ref 3.5–5.1)
Sodium: 139 mmol/L (ref 135–145)

## 2015-04-12 LAB — GLUCOSE, CAPILLARY
GLUCOSE-CAPILLARY: 114 mg/dL — AB (ref 65–99)
Glucose-Capillary: 123 mg/dL — ABNORMAL HIGH (ref 65–99)
Glucose-Capillary: 136 mg/dL — ABNORMAL HIGH (ref 65–99)

## 2015-04-12 LAB — MAGNESIUM: Magnesium: 2.2 mg/dL (ref 1.7–2.4)

## 2015-04-12 MED ORDER — CEFEPIME HCL 1 G IJ SOLR
1.0000 g | Freq: Three times a day (TID) | INTRAMUSCULAR | Status: DC
  Administered 2015-04-12 – 2015-04-15 (×9): 1 g via INTRAVENOUS
  Filled 2015-04-12 (×14): qty 1

## 2015-04-12 MED ORDER — INSULIN ASPART 100 UNIT/ML ~~LOC~~ SOLN: 0.0000 [IU] | Freq: Three times a day (TID) | SUBCUTANEOUS | Status: DC

## 2015-04-12 MED ORDER — VANCOMYCIN HCL IN DEXTROSE 1-5 GM/200ML-% IV SOLN
1000.0000 mg | Freq: Two times a day (BID) | INTRAVENOUS | Status: DC
  Administered 2015-04-12 – 2015-04-14 (×4): 1000 mg via INTRAVENOUS
  Filled 2015-04-12 (×6): qty 200

## 2015-04-12 MED ORDER — INSULIN ASPART 100 UNIT/ML ~~LOC~~ SOLN: 0.0000 [IU] | Freq: Every day | SUBCUTANEOUS | Status: DC

## 2015-04-12 NOTE — Care Management Note (Signed)
Case Management Note  Patient Details  Name: Christasia Angeletti MRN: [id number] Date of Birth: 06/28/1950  Subjective/Objective:     Pt admitted for Acute Respiratory Failure. Pt is from [facility].                Action/Plan: CSW is aware and is following pt for disposition needs. CM will continue to monitor.    Expected Discharge Date:                  Expected Discharge Plan:  Skilled Nursing Facility  In-House Referral:  Clinical Social Work  Discharge planning Services  CM Consult  Post Acute Care Choice:    Choice offered to:  NA  DME Arranged:  N/A DME Agency:  NA  HH Arranged:  NA HH Agency:  NA  Status of Service:  Completed, signed off  Medicare Important Message Given:    Date Medicare IM Given:    Medicare IM give by:    Date Additional Medicare IM Given:    Additional Medicare Important Message give by:     If discussed at Long Length of Stay Meetings, dates discussed:    Additional Comments:  Gala Lewandowsky, RN 04/12/2015, 11:48 AM

## 2015-04-12 NOTE — Progress Notes (Signed)
Patient ID: Rachel Baker, female   DOB: Apr 25, 1950, 65 y.o.   MRN: [id number]    Subjective:  Still with dyspnea on minimal exertion BM this am   Objective:  Filed Vitals:   04/12/15 0000 04/12/15 0135 04/12/15 0203 04/12/15 0400  BP: 147/53   119/48  Pulse: 73  74 64  Temp:    98.2 F (36.8 C)  TempSrc:    Oral  Resp: 26  24 32  Height:      Weight:    99.428 kg (219 lb 3.2 oz)  SpO2: 89% 90% 95% 96%    Intake/Output from previous day:  Intake/Output Summary (Last 24 hours) at 04/12/15 0806 Last data filed at 04/12/15 0309  Gross per 24 hour  Intake    240 ml  Output   1700 ml  Net  -1460 ml    Physical Exam: Affect appropriate Overweight white female  HEENT: normal Neck supple with no adenopathy JVP normal no bruits no thyromegaly Lungs crackels at base poor mechanics of breathign no  wheezing and good diaphragmatic motion Heart:  S1/S2 loud LVOT  Murmur worse with valsalva MR murmur as well , no rub, gallop or click PMI normal Abdomen: benighn, BS positve, no tenderness, no AAA no bruit.  No HSM or HJR Distal pulses intact with no bruits No edema Neuro non-focal Skin warm and dry No muscular weakness   Lab Results: Basic Metabolic Panel:  Recent Labs  72/53/66 0438 04/12/15 0310  NA 141 139  K 4.3 4.3  CL 102 101  CO2 24 21*  GLUCOSE 92 149*  BUN 15 20  CREATININE 0.79 0.94  CALCIUM 8.8* 8.9  MG  --  2.2   Liver Function Tests:  Recent Labs  04/11/15 0438  AST 37  ALT 21  ALKPHOS 63  BILITOT 2.7*  PROT 5.3*  ALBUMIN 2.7*   No results for input(s): LIPASE, AMYLASE in the last 72 hours. CBC:  Recent Labs  04/11/15 0438 04/12/15 0310  WBC 15.3* 16.8*  NEUTROABS 12.5*  --   HGB 12.7 12.9  HCT 40.1 40.7  MCV 92.0 92.1  PLT 231 298   Cardiac Enzymes:  Recent Labs  04/11/15 0150 04/11/15 0738 04/11/15 1332  TROPONINI 0.33* 0.35* 0.25*    Imaging: Dg Chest 2 View  04/25/2015  CLINICAL DATA:  Shortness of breath and  hypoxia. EXAM: CHEST  2 VIEW COMPARISON:  03/28/2015 FINDINGS: Shallow inspiration. Cardiac enlargement with increased vascular congestion. Bilateral parenchymal airspace disease, increasing since previous study. This may indicate edema or pneumonia. No blunting of costophrenic angles. No pneumothorax. 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 Degenerative changes in the spine and shoulders. IMPRESSION: Cardiac enlargement with pulmonary vascular congestion. Increasing bilateral airspace infiltrates in the lungs may indicate edema or pneumonia. Electronically Signed   By: Burman Nieves M.D.   On: 04/09/2015 23:05    Cardiac Studies:  ECG:  SR poor R wave progression LAD no acute changes     Telemetry:  NSR rates 80's   Echo:   04/03/15  Study Conclusions  - Left ventricle: The cavity size was normal. There was severe  focal basal hypertrophy of the septum with moderate hypertrophy  of the posterior wall. Consistent with hypertrophic  cardiomyopathy. Systolic function was normal. The estimated  ejection fraction was 75%. There was dynamic obstruction at  restin the mid cavity, with a peak gradient  of 9 mm Hg. Wall  motion was normal; there were no regional wall motion  abnormalities. Doppler parameters are consistent with abnormal  left ventricular relaxation (grade 1 diastolic dysfunction). - Aortic valve: Right coronary and noncoronary cusp mobility was  restricted. 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 There was mild  regurgitation. Peak velocity (S): 317.44 cm/s. Mean gradient (S):  22 mm Hg. Regurgitation pressure half-time: 772 ms. - Mitral valve: There is a calcified, mobile density on the  posterior leaflet of the mitral valve measuring 1.4 cm x 0.6 cm.  There is also heavy calcification of the mitral valve annulus,  worse posteriorly. Mobility was restricted. The  findings are  consistent with moderate stenosis. There was mild regurgitation.  Pressure half-time: 152 ms. Mean gradient (D): 5 mm Hg. Valve  area by pressure half-time: 1.45 cm^2. - Right ventricle: The cavity size was normal. Wall thickness was  normal. Systolic function was normal. - Tricuspid valve: Structurally normal valve. There was no  regurgitation. - Inferior vena cava: The vessel was normal in size. The  respirophasic diameter changes were in the normal range (= 50%),  consistent with normal central venous pressure. - Pericardium, extracardiac: A trivial pericardial effusion was  identified posterior to the heart.  Medications:   . albuterol  2.5 mg Nebulization Q6H  . arformoterol  15 mcg Nebulization BID  . aspirin  81 mg Oral Daily  . aztreonam  2 g Intravenous 3 times per day  . budesonide (PULMICORT) nebulizer solution  0.5 mg Nebulization BID  . guaiFENesin  1,200 mg Oral BID  . methylPREDNISolone (SOLU-MEDROL) injection  40 mg Intravenous Q6H  . metoprolol succinate  75 mg Oral BID  . oseltamivir  75 mg Oral BID  . pantoprazole  40 mg Oral BID AC  . sodium chloride flush  3 mL Intravenous Q12H  . vancomycin  1,250 mg Intravenous Q12H       Assessment/Plan:  1. Acute Hypoxic Respiratory Failure Mixed cardiopulmonary etiology - BNP mildly elevated to 267 on admission. Good output with lasix . Weight was 226 lbs at time of discharge on 04/04/2015, down to 220 lbs at time of admission. She does not appear overly volume overloaded on physical exam.    - CXR on admission showed increasing bilateral airspace infiltrates which may indicate edema or PNA. She has been started on Vancomycin and Aztreonam for possible PNA per the admitting team. She reported "significant improvement" in her breathing following her nebulizer treatment   - Pulmonology following. There is concern for inflammatory or interstitial lung disease. Her Sed rate had been elevated to 96 last  admission, improved to 34 this admission.  2. Elevated Troponin - cyclic troponin values have been 0.33 and 0.35 (down-trending from 0.91 three weeks ago). - EKG without acute changes. She denies any recent chest pain.  - had recent cardiac catheterization on 03/29/2015 which showed mild non-obstructive CAD. - No further ischemic evaluation indicated at this time with her recent cath results.   3. HOCM - severe focal basal hypertrophy of the septum with moderate hypertrophy of the posterior wall was noted on recent echo last admission. EF was 75%.  - Beta blocker increased to maximize diastolic filling time   4. Mitral Valve Nodule - recommended repeat TEE in 4-6 months   Charlton Haws

## 2015-04-12 NOTE — Progress Notes (Signed)
Patient having episodic hypoxia lasting a few minutes with SPO2 typically in the mid-to-high 80% range. Last episode, beginning around 20:40 was sustained until nearly 21:35. SPO2 during this episode dropped as low 55% and averaged between 69-88%. Patient has been on 100% O2 per non-rebreather mask with brief interruption by respiratory therapy to deliver nebulized medications. Turn, cough, deep breathing, pulmonary toilet and calming techniques were minimally successful.  Currently, patient is holding SPO2 between 82-94% on non-rebreather.  Discussed patient issues with Elray Mcgregor, NP on call for [facility]. Orders received.  Monitoring acutely.

## 2015-04-12 NOTE — Progress Notes (Signed)
RN has not been able to wean pt off of NRB today. Pt has been very anxious, received a xanax this afternoon. Pt has been up to bsc, despite her respiratory status, she states she can not void on the bedpan. Pt is now resting in bed comfortably with her family at her bedside, 02 is currently 92%

## 2015-04-12 NOTE — Progress Notes (Signed)
Chaplain presented to the patient, introduction as Chaplain. The patient needed assistance with completing as Advance Directive.  Chaplin explained each compotent of the directive to the patient and her sister who was present will be the Health Care Power of Attorney for the patient.  Upon completion of the form Chaplain obtained witnesses and the Notary to finalize the document.  A Copy was placed in the patient's medical record, and two copies were given to the patient.  Chaplain offered a prayer of healing and wholeness for the patient. Chaplain Janell Quiet [phone]

## 2015-04-12 NOTE — [place] FL2 (Signed)
[place] MEDICAID FL2 LEVEL OF CARE SCREENING TOOL     IDENTIFICATION  Patient Name: Rachel Baker Birthdate: 01/02/1951 Sex: female Admission Date (Current Location): 04/11/2015  [facility] and [place] Number:  Producer, television/film/video and Address:  [facility], [address], [place], [place] [address]      Provider Number: [id number]  Attending Physician Name and Address:  Rodolph Bong, MD  Relative Name and Phone Number:       Current Level of Care: Hospital Recommended Level of Care: Skilled Nursing Facility Prior Approval Number:    Date Approved/Denied:   PASRR Number:  [id number])  Discharge Plan: SNF    Current Diagnoses: Patient Active Problem List   Diagnosis Date Noted  . CHF exacerbation (HCC) 04/11/2015  . Acute respiratory failure (HCC) 04/11/2015  . Influenza B 04/11/2015  . Bilateral pulmonary infiltrates on chest x-ray   . Acute respiratory failure with hypoxemia (HCC)   . 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 Cardiac mass   . HOCM (hypertrophic obstructive cardiomyopathy) (HCC)   . Acute respiratory failure with hypoxia (HCC)   . SOB (shortness of breath)   . Morbid obesity with BMI of 40.0-44.9, adult (HCC) 03/19/2015  . Acrocyanosis (HCC)   . Lobar pneumonia (HCC)   . PNA (pneumonia) 03/17/2015  . Acute diastolic heart failure (HCC)   . CAP (community acquired pneumonia) 03/15/2015  . Pneumonia 03/15/2015  . Elevated troponin 03/15/2015    Orientation RESPIRATION BLADDER Height & Weight     Self, Time, Situation, Place  O2 Continent Weight: 219 lb 3.2 oz (99.428 kg) Height:  5\' 3"  (160 cm)  BEHAVIORAL SYMPTOMS/MOOD NEUROLOGICAL BOWEL NUTRITION STATUS      Continent  (heart healthy)  AMBULATORY STATUS COMMUNICATION OF NEEDS Skin   Extensive Assist   Normal                       Personal Care Assistance Level of Assistance  Bathing, Feeding, Dressing Bathing Assistance: Limited assistance Feeding  assistance: Limited assistance Dressing Assistance: Limited assistance     Functional Limitations Info  Sight, Hearing, Speech Sight Info: Adequate Hearing Info: Adequate Speech Info: Adequate    SPECIAL CARE FACTORS FREQUENCY  PT (By licensed PT), OT (By licensed OT)                    Contractures      Additional Factors Info  Insulin Sliding Scale Code Status Info:  (full code)     Insulin Sliding Scale Info:  (3x daily with meals)       Current Medications (04/12/2015):  This is the current hospital active medication list Current Facility-Administered Medications  Medication Dose Route Frequency Provider Last Rate Last Dose  . acetaminophen (TYLENOL) tablet 650 mg  650 mg Oral Q6H PRN 06/12/2015, MD       Or  . acetaminophen (TYLENOL) suppository 650 mg  650 mg Rectal Q6H PRN Eduard Clos, MD      . albuterol (PROVENTIL) (2.5 MG/3ML) 0.083% nebulizer solution 2.5 mg  2.5 mg Nebulization Q3H PRN Rahul P Desai, PA-C   2.5 mg at 04/11/15 1708  . albuterol (PROVENTIL) (2.5 MG/3ML) 0.083% nebulizer solution 2.5 mg  2.5 mg Nebulization Q6H 06/11/15, MD   2.5 mg at 04/12/15 0918  . ALPRAZolam 06/12/15) tablet 0.5 mg  0.5 mg Oral TID PRN Prudy Feeler, MD      .  arformoterol (BROVANA) nebulizer solution 15 mcg  15 mcg Nebulization BID Jose Alexis Frock, MD   15 mcg at 04/12/15 [phone]  . aspirin chewable tablet 81 mg  81 mg Oral Daily Eduard Clos, MD   81 mg at 04/12/15 0810  . budesonide (PULMICORT) nebulizer solution 0.5 mg  0.5 mg Nebulization BID Louann Sjogren, MD   0.5 mg at 04/12/15 7902  . ceFEPIme (MAXIPIME) 1 g in dextrose 5 % 50 mL IVPB  1 g Intravenous 3 times per day Rodolph Bong, MD      . guaiFENesin [facility]) 12 hr tablet 1,200 mg  1,200 mg Oral BID Rodolph Bong, MD   1,200 mg at 04/12/15 0809  . insulin aspart (novoLOG) injection 0-20 Units  0-20 Units Subcutaneous TID WC Coralyn Helling, MD   0 Units at  04/12/15 1217  . insulin aspart (novoLOG) injection 0-5 Units  0-5 Units Subcutaneous QHS Coralyn Helling, MD      . methylPREDNISolone sodium succinate (SOLU-MEDROL) 40 mg/mL injection 40 mg  40 mg Intravenous Q6H Jose Alexis Frock, MD   40 mg at 04/12/15 4097  . metoprolol succinate (TOPROL-XL) 24 hr tablet 75 mg  75 mg Oral BID Ellsworth Lennox, [place]   75 mg at 04/12/15 0810  . ondansetron (ZOFRAN) tablet 4 mg  4 mg Oral Q6H PRN Eduard Clos, MD       Or  . ondansetron [facility]) injection 4 mg  4 mg Intravenous Q6H PRN Eduard Clos, MD      . oseltamivir (TAMIFLU) capsule 75 mg  75 mg Oral BID Rodolph Bong, MD   75 mg at 04/12/15 0811  . pantoprazole (PROTONIX) EC tablet 40 mg  40 mg Oral BID AC Eduard Clos, MD   40 mg at 04/11/15 2119  . sodium chloride flush (NS) 0.9 % injection 3 mL  3 mL Intravenous Q12H Eduard Clos, MD   3 mL at 04/12/15 0819  . vancomycin (VANCOCIN) IVPB 1000 mg/200 mL premix  1,000 mg Intravenous Q12H Rodolph Bong, MD         Discharge Medications: Please see discharge summary for a list of discharge medications.  Relevant Imaging Results:  Relevant Lab Results:   Additional Information    Shawntelle Ungar M, LCSW

## 2015-04-12 NOTE — Progress Notes (Addendum)
Pharmacy Antibiotic Note  Rachel Baker is a 65 y.o. female admitted on 04/24/2015 with pneumonia.  Pharmacy has been consulted for Vancomycin and Aztreonam dosing x 8 days.  Plan: Change aztreonam to cefepime (discussed with Dr. Janee Morn - she tolerated this 2/17 - 2/19 of this year) Continue Vancomycin 1000mg  IV q12h x 8 days Continue tamiflu 75mg  BID for Influenza B Will f/u renal function, micro data, and pt's clinical condition Vanc trough prn  Height: 5\' 3"  (160 cm) Weight: 219 lb 3.2 oz (99.428 kg) IBW/kg (Calculated) : 52.4  Temp (24hrs), Avg:98 F (36.7 C), Min:97.6 F (36.4 C), Max:98.4 F (36.9 C)   Recent Labs Lab 04/06/2015 2305 04/11/15 0150 04/11/15 0438 04/11/15 0439 04/12/15 0310  WBC 16.1*  --  15.3*  --  16.8*  CREATININE 0.78  --  0.79  --  0.94  LATICACIDVEN  --  1.6  --  2.2*  --     Estimated Creatinine Clearance: 67.1 mL/min (by C-G formula based on Cr of 0.94).     Antimicrobials this admission: 3/9  Vanc >> 3/17 3/9 Aztreonam >>3/9 3/10 cefepime >>3/19 3/9 tamiflu  Dose adjustments this admission: n/a  Microbiology results: 3/9 influenza B positive 3/9 blood>>no growth so far   Thank you for allowing pharmacy to be a part of this patient's care.  5/9, PharmD, BCPS-AQ ID Clinical Pharmacist Pager [phone]   04/12/2015 1:01 PM

## 2015-04-12 NOTE — Progress Notes (Signed)
RT went to give pt her 0200 tx. Pts sats were 87% on 6L [place]. Tried pt on venti mask and high flow nasal canula without improvement. Pt did improve with 100% nonrebreather. RN notified at bedside. Pt is on Rapid Response work list. RT will continue to monitor.

## 2015-04-12 NOTE — Progress Notes (Signed)
Pt has been able to complete adls today, but gets very anxious, with calming techniques the patient calms down and is able to complete the task.

## 2015-04-12 NOTE — Progress Notes (Signed)
PULMONARY / CRITICAL CARE MEDICINE   Name: Rachel Baker MRN: [id number] DOB: 1950-09-07    ADMISSION DATE:  Apr 23, 2015 CONSULTATION DATE:  04/11/2015  REFERRING MD:  [facility]  CHIEF COMPLAINT:  Short of breath  SUBJECTIVE:  She feels her breathing has improved since last night.  Able to take deeper breaths.  VITAL SIGNS: BP 158/68 mmHg  Pulse 85  Temp(Src) 97.6 F (36.4 C) (Axillary)  Resp 37  Ht 5\' 3"  (1.6 m)  Wt 219 lb 3.2 oz (99.428 kg)  BMI 38.84 kg/m2  SpO2 94%  VENTILATOR SETTINGS: Vent Mode:  [-]  FiO2 (%):  [100 %] 100 %  INTAKE / OUTPUT: I/O last 3 completed shifts: In: 240 [P.O.:240] Out: 3850 [Urine:3850]  PHYSICAL EXAMINATION: General:  Sitting up in bed, speaks in full sentences Neuro:  Alert, normal strength HEENT:  No stridor Cardiovascular:  Regular, 2/6 SM  Lungs:  B/l crackles Abdomen:  Soft, non tender Musculoskeletal:  No edema Skin:  No rashes  LABS:  BMET  Recent Labs Lab 04/23/2015 2305 04/11/15 0438 04/12/15 0310  NA 139 141 139  K 3.9 4.3 4.3  CL 103 102 101  CO2 24 24 21*  BUN 14 15 20   CREATININE 0.78 0.79 0.94  GLUCOSE 116* 92 149*    Electrolytes  Recent Labs Lab 04-23-15 2305 04/11/15 0438 04/12/15 0310  CALCIUM 8.8* 8.8* 8.9  MG  --   --  2.2    CBC  Recent Labs Lab 04/23/15 2305 04/11/15 0438 04/12/15 0310  WBC 16.1* 15.3* 16.8*  HGB 12.9 12.7 12.9  HCT 39.3 40.1 40.7  PLT 248 231 298    Coag's No results for input(s): APTT, INR in the last 168 hours.  Sepsis Markers  Recent Labs Lab 04/11/15 0150 04/11/15 0233 04/11/15 0439  LATICACIDVEN 1.6  --  2.2*  PROCALCITON  --  <0.10  --     ABG  Recent Labs Lab 04/11/15 0247  PHART 7.471*  PCO2ART 34.1*  PO2ART 46.0*    Liver Enzymes  Recent Labs Lab 04/11/15 0438  AST 37  ALT 21  ALKPHOS 63  BILITOT 2.7*  ALBUMIN 2.7*    Cardiac Enzymes  Recent Labs Lab 04/11/15 0150 04/11/15 0738 04/11/15 1332  TROPONINI 0.33*  0.35* 0.25*    Glucose No results for input(s): GLUCAP in the last 168 hours.  Imaging No results found.   STUDIES:   CULTURES: 3/09 Influenza PCR >> Flu B positive  ANTIBIOTICS: 3/08 Vancomycin >> 3/08 Azactam >> 3/09 Tamiflu >>   SIGNIFICANT EVENTS: 3/08 Admit  LINES/TUBES:  DISCUSSION: 65 yo female former smoker presented from NH with dyspnea.  Found to have influenza B.  Was in hospital in February 2017 with pulmonary infiltrates of unclear cause >> serology, bronchoscopy negative.  ASSESSMENT / PLAN:  PULMONARY A: Acute respiratory failure with hypoxia 2nd to Influenza B. P:   Oxygen to keep SpO2 > 92% F/u CXR If respiratory status gets worse, then try on BiPAP first Scheduled BDs Continue solumedrol  CARDIOVASCULAR A:  Hx of Hypertrophic CM, mitral valve nodule. P:  Continue toprol, ASA  RENAL A:   No acute issues. P:   Monitor renal fx, urine outpt  GASTROINTESTINAL A:   Morbid obesity. Hx of GERD. P:   Protonix  HEMATOLOGIC A:   Leukocytosis. P:  F/u CBC SQ heparin for DVT prophylaxis  INFECTIOUS A:   Pneumonia 2nd to influenza B. P:   Continue tamiflu, azactam, vancomycin  ENDOCRINE A:  Steroid induced hyperglycemia. P:   SSI while on solumedrol  NEUROLOGIC A:   No acute issues. P:   Monitor mental status  Okay to keep in SDU for now.  Coralyn Helling, MD [facility] Pulmonary/Critical Care 04/12/2015, 11:25 AM Pager:  [phone] After 3pm call: [phone]

## 2015-04-12 NOTE — Progress Notes (Signed)
Patient was taken off NRB and placed on treatmentl only 1 oxygen flow meter in room. Patient SPO2 dropped to 70s mid treatment and was placed back on NRB until SPO2 increased to 89%. Continued breathing treatment.

## 2015-04-12 NOTE — Progress Notes (Signed)
[facility] PROGRESS NOTE  Rachel Baker MVH:[id number] DOB: 1951/01/11 DOA: 04-23-2015 PCP: Georgann Housekeeper, MD  Assessment/Plan: #1 acute respiratory failure with hypoxia likely secondary to pneumonia and influenza B patient still requiring nonrebreather mask however speaking in full sentences. Patient states some improvement since admission have assisted states shortness of breath with no significant improvement. Influenza PCR was positive for influenza B. Blood cultures pending. Sputum Gram stain and culture pending. Patient has been afebrile. Patient with a leukocytosis. Change IV history on them to IV cefepime. Continue IV vancomycin. Continue nebulizer treatments, Brovana, Pulmicort, IV steroids, Mucinex, Tamiflu, PPI. Diuretics have been discontinued per cardiology. Per critical care medicine and cardiology. Patient's respiratory status worsened likely need BiPAP. Follow.  #2 elevated troponin Patient noted to have elevated troponin. EKG with no ischemic changes. Patient denies any chest pain. Patient recent cardiac catheterization 03/29/2015 which showed mild nonobstructive coronary artery disease. Per cardiology no further ischemic workup indicated at this time.  #3 HOCM Noted. 2-D echo during last admission. Diuretics discontinued per cardiology yesterday. Beta blocker dose has been increased to maximize diastolic filling time per cardiology. Follow.   #4 mitral valve nodule Recommended repeat TEE in 4-6 months.  #5 acrocyanosis Unclear etiology. Up and about a year ago. Follow.  #6 prophylaxis PPI for GI prophylaxis. Lovenox for DVT prophylaxis.    Code Status: Full Family Communication: Updated sister and patient at bedside.  Disposition Plan: Home when medically stable and improved and on nasal cannula home oxygen dose and per critical care.  Consultants:  PCCM: Dr Lucy Chris 04/11/2015  Cardiology: Dr.Nishan 04/11/2015  Procedures:  Chest x-ray  04-23-15  Antibiotics:  IV cefepime 04/12/2015  IV vancomycin 04/11/2015  IV aztreonam 04/11/2015>>>> 04/12/2015  HPI/Subjective: Patient still with some shortness of breath requiring nonrebreather. Patient states some improvement since admission. Patient sitting in a bedside doesn't think patient shortness of breath has improved significantly. Patient denies any chest pain.  Objective: Filed Vitals:   04/12/15 0400 04/12/15 0800  BP: 119/48 158/68  Pulse: 64 85  Temp: 98.2 F (36.8 C) 97.6 F (36.4 C)  Resp: 32 37    Intake/Output Summary (Last 24 hours) at 04/12/15 1059 Last data filed at 04/12/15 0912  Gross per 24 hour  Intake    600 ml  Output   2000 ml  Net  -1400 ml   Filed Weights   04/23/2015 2227 04/11/15 1037 04/12/15 0400  Weight: 99.791 kg (220 lb) 99.973 kg (220 lb 6.4 oz) 99.428 kg (219 lb 3.2 oz)    Exam:   General:  On 100% nonrebreather  Cardiovascular: Regular rate and rhythm with 3/6 systolic ejection murmur  Respiratory: Bibasilar crackles. No wheezing.  Abdomen: Obese, soft, nontender, nondistended, positive bowel sounds.  Musculoskeletal: No clubbing cyanosis or edema.  Data Reviewed: Basic Metabolic Panel:  Recent Labs Lab 04/23/2015 2305 04/11/15 0438 04/12/15 0310  NA 139 141 139  K 3.9 4.3 4.3  CL 103 102 101  CO2 24 24 21*  GLUCOSE 116* 92 149*  BUN 14 15 20   CREATININE 0.78 0.79 0.94  CALCIUM 8.8* 8.8* 8.9  MG  --   --  2.2   Liver Function Tests:  Recent Labs Lab 04/11/15 0438  AST 37  ALT 21  ALKPHOS 63  BILITOT 2.7*  PROT 5.3*  ALBUMIN 2.7*   No results for input(s): LIPASE, AMYLASE in the last 168 hours. No results for input(s): AMMONIA in the last 168 hours. CBC:  Recent Labs Lab 04/23/2015 2305  04/11/15 0438 04/12/15 0310  WBC 16.1* 15.3* 16.8*  NEUTROABS  --  12.5*  --   HGB 12.9 12.7 12.9  HCT 39.3 40.1 40.7  MCV 91.4 92.0 92.1  PLT 248 231 298   Cardiac Enzymes:  Recent Labs Lab  04/11/15 0150 04/11/15 0738 04/11/15 1332  TROPONINI 0.33* 0.35* 0.25*   BNP (last 3 results)  Recent Labs  03/20/15 1333 03/25/15 0457 04-15-2015 2315  BNP 98.0 230.9* 267.9*    ProBNP (last 3 results) No results for input(s): PROBNP in the last 8760 hours.  CBG: No results for input(s): GLUCAP in the last 168 hours.  No results found for this or any previous visit (from the past 240 hour(s)).   Studies: Dg Chest 2 View  04-15-2015  CLINICAL DATA:  Shortness of breath and hypoxia. EXAM: CHEST  2 VIEW COMPARISON:  03/28/2015 FINDINGS: Shallow inspiration. Cardiac enlargement with increased vascular congestion. Bilateral parenchymal airspace disease, increasing since previous study. This may indicate edema or pneumonia. No blunting of costophrenic angles. No pneumothorax. 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 Degenerative changes in the spine and shoulders. IMPRESSION: Cardiac enlargement with pulmonary vascular congestion. Increasing bilateral airspace infiltrates in the lungs may indicate edema or pneumonia. Electronically Signed   By: Burman Nieves M.D.   On: April 15, 2015 23:05    Scheduled Meds: . albuterol  2.5 mg Nebulization Q6H  . arformoterol  15 mcg Nebulization BID  . aspirin  81 mg Oral Daily  . aztreonam  2 g Intravenous 3 times per day  . budesonide (PULMICORT) nebulizer solution  0.5 mg Nebulization BID  . guaiFENesin  1,200 mg Oral BID  . methylPREDNISolone (SOLU-MEDROL) injection  40 mg Intravenous Q6H  . metoprolol succinate  75 mg Oral BID  . oseltamivir  75 mg Oral BID  . pantoprazole  40 mg Oral BID AC  . sodium chloride flush  3 mL Intravenous Q12H  . vancomycin  1,250 mg Intravenous Q12H   Continuous Infusions:   Principal Problem:   Acute respiratory failure with hypoxemia (HCC) Active Problems:   Influenza B   Acute diastolic heart  failure (HCC)   Acrocyanosis (HCC)   HOCM (hypertrophic obstructive cardiomyopathy) (HCC)   Mitral valve mass   Acute respiratory failure (HCC)    Time spent: 35 minutes    THOMPSON,DANIEL M.D. [facility] Pager [phone]. If 7PM-7AM, please contact night-coverage at [email or web address], password [facility] 04/12/2015, 10:59 AM  LOS: 1 day

## 2015-04-12 NOTE — Progress Notes (Signed)
UR Completed Joyleen Haselton Graves-Bigelow, RN,BSN [phone]  

## 2015-04-13 ENCOUNTER — Other Ambulatory Visit: Payer: Self-pay

## 2015-04-13 ENCOUNTER — Inpatient Hospital Stay (HOSPITAL_COMMUNITY): Payer: Medicare HMO

## 2015-04-13 DIAGNOSIS — J9600 Acute respiratory failure, unspecified whether with hypoxia or hypercapnia: Secondary | ICD-10-CM

## 2015-04-13 LAB — POCT I-STAT 3, ART BLOOD GAS (G3+)
ACID-BASE EXCESS: 3 mmol/L — AB (ref 0.0–2.0)
Acid-Base Excess: 1 mmol/L (ref 0.0–2.0)
Bicarbonate: 28.9 mEq/L — ABNORMAL HIGH (ref 20.0–24.0)
Bicarbonate: 30.9 mEq/L — ABNORMAL HIGH (ref 20.0–24.0)
O2 Saturation: 91 %
O2 Saturation: 99 %
PH ART: 7.319 — AB (ref 7.350–7.450)
TCO2: 31 mmol/L (ref 0–100)
TCO2: 33 mmol/L (ref 0–100)
pCO2 arterial: 55.8 mmHg — ABNORMAL HIGH (ref 35.0–45.0)
pCO2 arterial: 57.2 mmHg (ref 35.0–45.0)
pH, Arterial: 7.337 — ABNORMAL LOW (ref 7.350–7.450)
pO2, Arterial: 67 mmHg — ABNORMAL LOW (ref 80.0–100.0)
pO2, Arterial: 132 mmHg — ABNORMAL HIGH (ref 80.0–100.0)

## 2015-04-13 LAB — ECHOCARDIOGRAM COMPLETE
Height: 63 in
Weight: 3619.07 oz

## 2015-04-13 LAB — MAGNESIUM
MAGNESIUM: 2.2 mg/dL (ref 1.7–2.4)
MAGNESIUM: 2.3 mg/dL (ref 1.7–2.4)

## 2015-04-13 LAB — TRIGLYCERIDES: TRIGLYCERIDES: 127 mg/dL (ref <150)

## 2015-04-13 LAB — PHOSPHORUS
PHOSPHORUS: 6.4 mg/dL — AB (ref 2.5–4.6)
Phosphorus: 4.7 mg/dL — ABNORMAL HIGH (ref 2.5–4.6)

## 2015-04-13 LAB — BLOOD GAS, ARTERIAL
Acid-Base Excess: 3.3 mmol/L — ABNORMAL HIGH (ref 0.0–2.0)
Bicarbonate: 27.2 mEq/L — ABNORMAL HIGH (ref 20.0–24.0)
Drawn by: 312761
FIO2: 100
O2 Saturation: 90.4 %
PATIENT TEMPERATURE: 98.6
PCO2 ART: 40.5 mmHg (ref 35.0–45.0)
PO2 ART: 58.6 mmHg — AB (ref 80.0–100.0)
TCO2: 28.4 mmol/L (ref 0–100)
pH, Arterial: 7.442 (ref 7.350–7.450)

## 2015-04-13 LAB — CBC
HCT: 39.9 % (ref 36.0–46.0)
HEMOGLOBIN: 12.4 g/dL (ref 12.0–15.0)
MCH: 29 pg (ref 26.0–34.0)
MCHC: 31.1 g/dL (ref 30.0–36.0)
MCV: 93.4 fL (ref 78.0–100.0)
Platelets: 241 10*3/uL (ref 150–400)
RBC: 4.27 MIL/uL (ref 3.87–5.11)
RDW: 17.6 % — AB (ref 11.5–15.5)
WBC: 22.6 10*3/uL — AB (ref 4.0–10.5)

## 2015-04-13 LAB — BASIC METABOLIC PANEL
ANION GAP: 12 (ref 5–15)
BUN: 19 mg/dL (ref 6–20)
CALCIUM: 9.1 mg/dL (ref 8.9–10.3)
CO2: 26 mmol/L (ref 22–32)
Chloride: 104 mmol/L (ref 101–111)
Creatinine, Ser: 0.66 mg/dL (ref 0.44–1.00)
GLUCOSE: 132 mg/dL — AB (ref 65–99)
Potassium: 4.1 mmol/L (ref 3.5–5.1)
SODIUM: 142 mmol/L (ref 135–145)

## 2015-04-13 LAB — GLUCOSE, CAPILLARY
GLUCOSE-CAPILLARY: 108 mg/dL — AB (ref 65–99)
GLUCOSE-CAPILLARY: 134 mg/dL — AB (ref 65–99)
Glucose-Capillary: 87 mg/dL (ref 65–99)
Glucose-Capillary: 108 mg/dL — ABNORMAL HIGH (ref 65–99)

## 2015-04-13 LAB — TROPONIN I
TROPONIN I: 1.26 ng/mL — AB (ref <0.031)
Troponin I: 0.49 ng/mL — ABNORMAL HIGH (ref <0.031)

## 2015-04-13 LAB — MRSA PCR SCREENING: MRSA BY PCR: NEGATIVE

## 2015-04-13 LAB — PROCALCITONIN: Procalcitonin: <0.1 ng/mL

## 2015-04-13 MED ORDER — VITAL HIGH PROTEIN PO LIQD
1000.0000 mL | ORAL | Status: DC
  Filled 2015-04-13 (×2): qty 1000

## 2015-04-13 MED ORDER — ACETAMINOPHEN 160 MG/5ML PO SOLN: 650.0000 mg | Freq: Four times a day (QID) | ORAL | Status: DC | PRN

## 2015-04-13 MED ORDER — MIDAZOLAM HCL 2 MG/2ML IJ SOLN
1.0000 mg | INTRAMUSCULAR | Status: DC | PRN
  Administered 2015-04-13: 1 mg via INTRAVENOUS
  Filled 2015-04-13: qty 2

## 2015-04-13 MED ORDER — CISATRACURIUM BOLUS VIA INFUSION
0.0500 mg/kg | Freq: Once | INTRAVENOUS | Status: AC
  Administered 2015-04-13: 5.1 mg via INTRAVENOUS
  Filled 2015-04-13: qty 6

## 2015-04-13 MED ORDER — ANTISEPTIC ORAL RINSE SOLUTION (CORINZ)
7.0000 mL | OROMUCOSAL | Status: DC
  Administered 2015-04-13 – 2015-04-18 (×51): 7 mL via OROMUCOSAL

## 2015-04-13 MED ORDER — FENTANYL BOLUS VIA INFUSION
25.0000 ug | INTRAVENOUS | Status: DC | PRN
Start: 2015-04-13 — End: 2015-04-13
  Filled 2015-04-13: qty 25

## 2015-04-13 MED ORDER — FENTANYL BOLUS VIA INFUSION
50.0000 ug | INTRAVENOUS | Status: DC | PRN
  Filled 2015-04-13: qty 50

## 2015-04-13 MED ORDER — SODIUM CHLORIDE 0.9 % IV BOLUS (SEPSIS)
1000.0000 mL | Freq: Once | INTRAVENOUS | Status: AC
  Administered 2015-04-13: 1000 mL via INTRAVENOUS

## 2015-04-13 MED ORDER — OSELTAMIVIR PHOSPHATE 6 MG/ML PO SUSR
75.0000 mg | Freq: Two times a day (BID) | ORAL | Status: DC
  Administered 2015-04-13 – 2015-04-14 (×3): 75 mg
  Filled 2015-04-13 (×3): qty 12.5

## 2015-04-13 MED ORDER — PANTOPRAZOLE SODIUM 40 MG PO PACK
40.0000 mg | PACK | ORAL | Status: DC
  Administered 2015-04-13 – 2015-04-19 (×7): 40 mg
  Filled 2015-04-13 (×12): qty 20

## 2015-04-13 MED ORDER — PRO-STAT SUGAR FREE PO LIQD
30.0000 mL | Freq: Two times a day (BID) | ORAL | Status: AC
  Administered 2015-04-13: 30 mL
  Filled 2015-04-13: qty 30

## 2015-04-13 MED ORDER — CHLORHEXIDINE GLUCONATE 0.12% ORAL RINSE (MEDLINE KIT)
15.0000 mL | Freq: Two times a day (BID) | OROMUCOSAL | Status: DC
  Administered 2015-04-13 – 2015-04-18 (×12): 15 mL via OROMUCOSAL

## 2015-04-13 MED ORDER — INSULIN ASPART 100 UNIT/ML ~~LOC~~ SOLN
0.0000 [IU] | SUBCUTANEOUS | Status: DC
  Administered 2015-04-13: 3 [IU] via SUBCUTANEOUS
  Administered 2015-04-14: 4 [IU] via SUBCUTANEOUS
  Administered 2015-04-14 (×2): 3 [IU] via SUBCUTANEOUS
  Administered 2015-04-14: 4 [IU] via SUBCUTANEOUS
  Administered 2015-04-14 – 2015-04-19 (×3): 3 [IU] via SUBCUTANEOUS

## 2015-04-13 MED ORDER — FENTANYL CITRATE (PF) 100 MCG/2ML IJ SOLN: 100.0000 ug | Freq: Once | INTRAMUSCULAR | Status: DC | PRN

## 2015-04-13 MED ORDER — VITAL HIGH PROTEIN PO LIQD: 1000.0000 mL | ORAL | Status: DC

## 2015-04-13 MED ORDER — SODIUM CHLORIDE 0.9 % IV SOLN
25.0000 ug/h | INTRAVENOUS | Status: DC
  Administered 2015-04-13: 150 ug/h via INTRAVENOUS
  Filled 2015-04-13: qty 50

## 2015-04-13 MED ORDER — PRO-STAT SUGAR FREE PO LIQD
30.0000 mL | Freq: Two times a day (BID) | ORAL | Status: DC
  Administered 2015-04-13: 30 mL
  Filled 2015-04-13: qty 30

## 2015-04-13 MED ORDER — HEPARIN BOLUS VIA INFUSION
4000.0000 [IU] | Freq: Once | INTRAVENOUS | Status: AC
  Administered 2015-04-13: 4000 [IU] via INTRAVENOUS
  Filled 2015-04-13: qty 4000

## 2015-04-13 MED ORDER — HEPARIN (PORCINE) IN NACL 100-0.45 UNIT/ML-% IJ SOLN
1350.0000 [IU]/h | INTRAMUSCULAR | Status: DC
  Administered 2015-04-13: 1000 [IU]/h via INTRAVENOUS
  Administered 2015-04-14: 1350 [IU]/h via INTRAVENOUS
  Filled 2015-04-13 (×5): qty 250

## 2015-04-13 MED ORDER — ARTIFICIAL TEARS OP OINT
1.0000 | TOPICAL_OINTMENT | Freq: Three times a day (TID) | OPHTHALMIC | Status: DC
Start: 2015-04-13 — End: 2015-04-15
  Administered 2015-04-13 – 2015-04-15 (×5): 1 via OPHTHALMIC
  Filled 2015-04-13: qty 3.5

## 2015-04-13 MED ORDER — NOREPINEPHRINE BITARTRATE 1 MG/ML IV SOLN
0.0000 ug/min | INTRAVENOUS | Status: DC
  Administered 2015-04-13: 5 ug/min via INTRAVENOUS
  Administered 2015-04-14 (×2): 13 ug/min via INTRAVENOUS
  Filled 2015-04-13 (×5): qty 4

## 2015-04-13 MED ORDER — SODIUM CHLORIDE 0.9 % IV SOLN
25.0000 ug/h | INTRAVENOUS | Status: DC
  Administered 2015-04-14: 100 ug/h via INTRAVENOUS
  Administered 2015-04-15: 150 ug/h via INTRAVENOUS
  Filled 2015-04-13 (×2): qty 50

## 2015-04-13 MED ORDER — FUROSEMIDE 10 MG/ML IJ SOLN
20.0000 mg | Freq: Once | INTRAMUSCULAR | Status: DC
  Administered 2015-04-13: 20 mg via INTRAVENOUS
  Filled 2015-04-13: qty 2

## 2015-04-13 MED ORDER — PROPOFOL 1000 MG/100ML IV EMUL
5.0000 ug/kg/min | INTRAVENOUS | Status: DC
  Administered 2015-04-13: 20 ug/kg/min via INTRAVENOUS
  Administered 2015-04-14: 25 ug/kg/min via INTRAVENOUS
  Administered 2015-04-14: 23 ug/kg/min via INTRAVENOUS
  Administered 2015-04-14 (×2): 25 ug/kg/min via INTRAVENOUS
  Administered 2015-04-15 (×2): 30 ug/kg/min via INTRAVENOUS
  Filled 2015-04-13 (×7): qty 100

## 2015-04-13 MED ORDER — VITAL HIGH PROTEIN PO LIQD
1000.0000 mL | ORAL | Status: DC
  Administered 2015-04-13: 1000 mL
  Administered 2015-04-14: 06:00:00

## 2015-04-13 MED ORDER — SODIUM CHLORIDE 0.9 % IV SOLN: INTRAVENOUS | Status: DC | PRN

## 2015-04-13 MED ORDER — FENTANYL CITRATE (PF) 100 MCG/2ML IJ SOLN: 100.0000 ug | Freq: Once | INTRAMUSCULAR | Status: DC

## 2015-04-13 MED ORDER — PROPOFOL 1000 MG/100ML IV EMUL
0.0000 ug/kg/min | INTRAVENOUS | Status: DC
  Administered 2015-04-13: 10 ug/kg/min via INTRAVENOUS
  Administered 2015-04-13: 30 ug/kg/min via INTRAVENOUS
  Filled 2015-04-13 (×2): qty 100

## 2015-04-13 MED ORDER — SODIUM CHLORIDE 0.9 % IV SOLN
INTRAVENOUS | Status: DC
Start: 2015-04-13 — End: 2015-04-15
  Administered 2015-04-13 – 2015-04-14 (×2): via INTRAVENOUS

## 2015-04-13 MED ORDER — CISATRACURIUM BESYLATE (PF) 200 MG/20ML IV SOLN
3.0000 ug/kg/min | INTRAVENOUS | Status: DC
  Administered 2015-04-13: 3 ug/kg/min via INTRAVENOUS
  Administered 2015-04-14: 1.5 ug/kg/min via INTRAVENOUS
  Filled 2015-04-13 (×2): qty 20

## 2015-04-13 NOTE — Progress Notes (Signed)
Patient became agitated and demanded that BiPAP be discontinued because she is unable to communicate with the mask in place. She asked, "what comes next?  What do I do when I wake up with this thing on me?  How am I supposed to talk?" Explained that she could use the call-light or telephone to notify me or NT for needs and that we would respond to the room to talk to her. Further explained that current therapy was important to improve her oxygenation status. She was not satisfied by this; stated, "take this thing off!"  Resumed O2 delivery with non-rebreather mask at 100%. SPO2 currently 96% and holding.  Will notify NP of issues and enquire if repeat ABG in AM is desired to re-evaluate for hypoxia off BiPAP. Stable for now.  Continuing to monitor acutely.

## 2015-04-13 NOTE — Progress Notes (Signed)
[facility] PROGRESS NOTE  Rachel Baker AOZ:[id number] DOB: [birth year] DOA: 04-26-15 PCP: Georgann Housekeeper, MD  Assessment/Plan: #1 acute respiratory failure with hypoxia likely secondary to pneumonia and influenza B Patient went into acute respiratory distress overnight desatted into the 80s with ABG with a pH of 7.44 PCO2 of 39 PO2 of 55 with a bicarbonate of 26. Patient was placed on the BiPAP however did not tolerate it and BiPAP removed. Patient on 100% nonrebreather in acute respiratory distress using accessory muscles of respiration with thoracoabdominal breathing not speaking in full sentences. Patient is positive for influenza a be by influenza PCR. Blood cultures pending. Sputum Gram stain and culture pending. Patient with a worsening leukocytosis with a white count of 22.6. Chest x-ray this morning pending however on looking at the chest x-ray it's worse than chest x-ray on admission with concerns for possible ARDS versus volume overload. Chest x-ray results pending. Continue IV vancomycin, IV cefepime, nebulizer treatments, Brovana, Pulmicort, Tamiflu, PPI, Mucinex. Will give a dose of Lasix 40 mg IV 1 now. Consulted with anesthesia and patient now has been intubated and will be transferred to the intensive care unit. Patient may benefit from a broncoscopy patient recently hospitalized from 03/15/2015 - 04/04/2015 with acute hypoxic respiratory failure and had to be intubated during that hospitalization as well. Critical care and cardiology following.  #2 elevated troponin Patient noted to have elevated troponin. EKG with no ischemic changes. Patient denies any chest pain. Patient recent cardiac catheterization 03/29/2015 which showed mild nonobstructive coronary artery disease. Per cardiology no further ischemic workup indicated at this time.  #3 HOCM Noted. 2-D echo during last admission. Diuretics discontinued per cardiology. Beta blocker dose has been increased to maximize  diastolic filling time per cardiology. Will give Lasix 40 mg IV 1 secondary to acute respiratory distress. Follow.   #4 mitral valve nodule Recommended repeat TEE in 4-6 months.  #5 acrocyanosis Unclear etiology. Follow.  #6 prophylaxis PPI for GI prophylaxis. Lovenox for DVT prophylaxis.    Code Status: Full Family Communication: Updated sister and patient at bedside.  Disposition Plan: Intubate patient. Transfer to ICU.  Consultants:  PCCM: Dr Lucy Chris 04/11/2015  Cardiology: Dr.Nishan 04/11/2015  Procedures:  Chest x-ray Apr 26, 2015, 03/16/2015  Antibiotics:  IV cefepime 04/12/2015  IV vancomycin 04/11/2015  IV aztreonam 04/11/2015>>>> 04/12/2015  HPI/Subjective: Patient is significant respiratory distress using accessory muscles of respiration are speaking in full sentences. Patient with thoracoabdominal breathing. Events overnight noted patient went into respiratory distress was desatted into the 70s to 80s. ABG showed PaO2 of 55. Patient was initially placed on the BiPAP overnight was unable to tolerate it and BiPAP taken off. Patient on 100% nonrebreather this morning in significant respiratory distress.  Objective: Filed Vitals:   04/13/15 0619 04/13/15 0623  BP:  138/59  Pulse: 80   Temp: 96.9 F (36.1 C)   Resp: 35     Intake/Output Summary (Last 24 hours) at 04/13/15 0809 Last data filed at 04/13/15 0405  Gross per 24 hour  Intake   1090 ml  Output    300 ml  Net    790 ml   Filed Weights   Apr 26, 2015 2227 04/11/15 1037 04/12/15 0400  Weight: 99.791 kg (220 lb) 99.973 kg (220 lb 6.4 oz) 99.428 kg (219 lb 3.2 oz)    Exam:   General:  On 100% non rebreather. Using accessory muscles of respiration not speaking in full sentences.  Cardiovascular: Regular rate and rhythm with 3/6 systolic ejection murmur  Respiratory:  Diffuse crackles anterior lung fields, historian and 30 wheezing, poor air movement, use of accessory muscles of respiration.  Thoracoabdominal breathing.   Abdomen: Obese, soft, nontender, nondistended, positive bowel sounds.  Musculoskeletal: No clubbing cyanosis or edema.  Data Reviewed: Basic Metabolic Panel:  Recent Labs Lab 04/29/2015 2305 04/11/15 0438 04/12/15 0310 04/13/15 0502  NA 139 141 139 142  K 3.9 4.3 4.3 4.1  CL 103 102 101 104  CO2 24 24 21* 26  GLUCOSE 116* 92 149* 132*  BUN 14 15 20 19   CREATININE 0.78 0.79 0.94 0.66  CALCIUM 8.8* 8.8* 8.9 9.1  MG  --   --  2.2  --    Liver Function Tests:  Recent Labs Lab 04/11/15 0438  AST 37  ALT 21  ALKPHOS 63  BILITOT 2.7*  PROT 5.3*  ALBUMIN 2.7*   No results for input(s): LIPASE, AMYLASE in the last 168 hours. No results for input(s): AMMONIA in the last 168 hours. CBC:  Recent Labs Lab 04/27/2015 2305 04/11/15 0438 04/12/15 0310 04/13/15 0502  WBC 16.1* 15.3* 16.8* 22.6*  NEUTROABS  --  12.5*  --   --   HGB 12.9 12.7 12.9 12.4  HCT 39.3 40.1 40.7 39.9  MCV 91.4 92.0 92.1 93.4  PLT 248 231 298 241   Cardiac Enzymes:  Recent Labs Lab 04/11/15 0150 04/11/15 0738 04/11/15 1332  TROPONINI 0.33* 0.35* 0.25*   BNP (last 3 results)  Recent Labs  03/20/15 1333 03/25/15 0457 04/05/2015 2315  BNP 98.0 230.9* 267.9*    ProBNP (last 3 results) No results for input(s): PROBNP in the last 8760 hours.  CBG:  Recent Labs Lab 04/12/15 1156 04/12/15 1610 04/12/15 2120 04/13/15 0745  GLUCAP 123* 114* 136* 87    Recent Results (from the past 240 hour(s))  Culture, blood (routine x 2)     Status: None (Preliminary result)   Collection Time: 04/11/15  1:50 AM  Result Value Ref Range Status   Specimen Description BLOOD RIGHT ARM  Final   Special Requests BOTTLES DRAWN AEROBIC AND ANAEROBIC 06/11/15  Final   Culture NO GROWTH 1 DAY  Final   Report Status PENDING  Incomplete     Studies: No results found.  Scheduled Meds: . albuterol  2.5 mg Nebulization Q6H  . arformoterol  15 mcg Nebulization BID  . aspirin  81  mg Oral Daily  . budesonide (PULMICORT) nebulizer solution  0.5 mg Nebulization BID  . ceFEPime (MAXIPIME) IV  1 g Intravenous 3 times per day  . guaiFENesin  1,200 mg Oral BID  . insulin aspart  0-20 Units Subcutaneous TID WC  . insulin aspart  0-5 Units Subcutaneous QHS  . methylPREDNISolone (SOLU-MEDROL) injection  40 mg Intravenous Q6H  . metoprolol succinate  75 mg Oral BID  . oseltamivir  75 mg Oral BID  . pantoprazole  40 mg Oral BID AC  . sodium chloride flush  3 mL Intravenous Q12H  . vancomycin  1,000 mg Intravenous Q12H   Continuous Infusions:   Principal Problem:   Acute respiratory failure with hypoxemia (HCC) Active Problems:   Influenza B   Acute diastolic heart failure (HCC)   Acrocyanosis (HCC)   HOCM (hypertrophic obstructive cardiomyopathy) (HCC)   Mitral valve mass   Acute respiratory failure (HCC)    Time spent: 35 minutes    THOMPSON,DANIEL M.D. [facility] Pager [phone]. If 7PM-7AM, please contact night-coverage at [email or web address], password [facility] 04/13/2015, 8:09 AM  LOS: 2 days

## 2015-04-13 NOTE — Significant Event (Signed)
Rapid Response Event Note  Overview: Time Called: 0830 Arrival Time: 7262    Initial Focused Assessment: Upon arrival medical team is beginning intubation protocol.  Interventions: Assisted with intubation, patient comfort, transfer to ICU in bed with monitor, on ventilator.  Event Summary:   at      at          Kristine Linea

## 2015-04-13 NOTE — Progress Notes (Signed)
Blood pressure into the  80's , resp rate 38 asynchronous with the vent , dr sood made aware

## 2015-04-13 NOTE — Progress Notes (Signed)
ABG result drawn earlier this evening showed poor arterial oxygenation status with no acid/base imbalance. Patient was started on BiPAP therapy for worsening respiratory failure with hypoxia as identified. Currently tolerating therapy well with O2 saturations of 92-96% on 100% O2 per BiPAP.  Respiratory rate remains in the mid 30-40 breaths/minute range; some accessory muscle use noted. No adverse change noted  in mentation; patient has remained alert, oriented x 4 and able to carry-on a conversation throughout events tonight.  Continuing to monitor acutely.

## 2015-04-13 NOTE — Progress Notes (Signed)
Floor RT called d/t pt in resp distress, desat on bipap.  Upon entering room, noted pt in resp distress, pt alert and awake saying "please hurry!".  Anesthesia was called for intubation.  Pt was placed on 100% fio2 via bipap.  Anesthesia intubated pt w/ no apparent complications.  Pt bagged on 100% fio2, + easy cap color change (purple to yellow), + BBSH coarse = t/o. Placed pt on vent, Performed recruitment maneuver, Sat slowly improved to 90%. Pt transported to ICU on vent 100% fio2.

## 2015-04-13 NOTE — Progress Notes (Signed)
  Echocardiogram 2D Echocardiogram has been performed.  Delcie Roch 04/13/2015, 11:20 AM

## 2015-04-13 NOTE — Progress Notes (Addendum)
SUBJECTIVE:  Patient in acute respiratory distress  OBJECTIVE:   Vitals:   Filed Vitals:   04/13/15 0400 04/13/15 0619 04/13/15 0623 04/13/15 0750  BP: 130/61  138/59 141/68  Pulse: 81 80  78  Temp:  96.9 F (36.1 C)  98.5 F (36.9 C)  TempSrc:  Oral  Axillary  Resp: 38 35  37  Height:      Weight:      SpO2: 94% 90%  93%   I&O's:   Intake/Output Summary (Last 24 hours) at 04/13/15 9622 Last data filed at 04/13/15 0405  Gross per 24 hour  Intake   1090 ml  Output    300 ml  Net    790 ml   TELEMETRY: Reviewed telemetry pt in sinus tachcyardia:     PHYSICAL EXAM General: Well developed, well nourished, in severe acute respiratory distress using accessory muscles of respiration Lungs:   Diffuse crackles throughout Heart:   tachycardic S1 S2 Pulses are 2+ & equal. Abdomen: Bowel sounds are positive, abdomen soft and non-tender without masses Extremities:   No clubbing, cyanosis or edema.  DP +1 Neuro: Alert and oriented X 3.    LABS: Basic Metabolic Panel:  Recent Labs  29/79/89 0310 04/13/15 0502  NA 139 142  K 4.3 4.1  CL 101 104  CO2 21* 26  GLUCOSE 149* 132*  BUN 20 19  CREATININE 0.94 0.66  CALCIUM 8.9 9.1  MG 2.2  --    Liver Function Tests:  Recent Labs  04/11/15 0438  AST 37  ALT 21  ALKPHOS 63  BILITOT 2.7*  PROT 5.3*  ALBUMIN 2.7*   No results for input(s): LIPASE, AMYLASE in the last 72 hours. CBC:  Recent Labs  04/11/15 0438 04/12/15 0310 04/13/15 0502  WBC 15.3* 16.8* 22.6*  NEUTROABS 12.5*  --   --   HGB 12.7 12.9 12.4  HCT 40.1 40.7 39.9  MCV 92.0 92.1 93.4  PLT 231 298 241   Cardiac Enzymes:  Recent Labs  04/11/15 0150 04/11/15 0738 04/11/15 1332  TROPONINI 0.33* 0.35* 0.25*   BNP: Invalid input(s): POCBNP D-Dimer: No results for input(s): DDIMER in the last 72 hours. Hemoglobin A1C: No results for input(s): HGBA1C in the last 72 hours. Fasting Lipid Panel: No results for input(s): CHOL, HDL, LDLCALC,  TRIG, CHOLHDL, LDLDIRECT in the last 72 hours. Thyroid Function Tests: No results for input(s): TSH, T4TOTAL, T3FREE, THYROIDAB in the last 72 hours.  Invalid input(s): FREET3 Anemia Panel: No results for input(s): VITAMINB12, FOLATE, FERRITIN, TIBC, IRON, RETICCTPCT in the last 72 hours. Coag Panel:   No results found for: INR, PROTIME  RADIOLOGY: Dg Chest 2 View  05/08/2015  CLINICAL DATA:  Shortness of breath and hypoxia. EXAM: CHEST  2 VIEW COMPARISON:  03/28/2015 FINDINGS: Shallow inspiration. Cardiac enlargement with increased vascular congestion. Bilateral parenchymal airspace disease, increasing since previous study. This may indicate edema or pneumonia. No blunting of costophrenic angles. No pneumothorax. 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 Degenerative changes in the spine and shoulders. IMPRESSION: Cardiac enlargement with pulmonary vascular congestion. Increasing bilateral airspace infiltrates in the lungs may indicate edema or pneumonia. Electronically Signed   By: Burman Nieves M.D.   On: 2015/05/08 23:05   Dg Chest 2 View  03/20/2015  CLINICAL DATA:  Shortness of breath with exertion. EXAM: CHEST  2 VIEW COMPARISON:  03/18/2015 and 03/15/2015 FINDINGS: Lungs are  adequately inflated with multifocal airspace process over the mid to lower lungs bilaterally left worse than right. Findings demonstrate overall slight worsening. Possible small amount left pleural fluid. Findings likely due to multifocal infection. Borderline stable cardiomegaly. Remainder of the exam is unchanged. IMPRESSION: Multifocal airspace process over the mid to lower lungs left greater than right with slight interval worsening likely representing multifocal infection. Possible small amount left pleural fluid. Stable borderline cardiomegaly. Electronically Signed   By: Elberta Fortis M.D.   On:  03/20/2015 08:07   Dg Chest 2 View  03/18/2015  CLINICAL DATA:  Shortness of breath and cough since Friday, former smoker EXAM: CHEST  2 VIEW COMPARISON:  03/15/2015 FINDINGS: Enlargement of cardiac silhouette. Stable mediastinal contours. Diffuse BILATERAL airspace infiltrates, increased at RIGHT lower lobe versus previous study. Remaining lungs grossly unchanged. No definite pleural effusion or pneumothorax. Degenerative disc disease changes thoracic spine. IMPRESSION: Diffuse BILATERAL airspace infiltrates question pneumonia, increased in RIGHT lower lobe since previous exam. Electronically Signed   By: Ulyses Southward M.D.   On: 03/18/2015 08:10   Dg Chest 2 View  03/15/2015  CLINICAL DATA:  Severe shortness of breath for 1 week EXAM: CHEST  2 VIEW COMPARISON:  None. FINDINGS: Mild cardiac enlargement. Vascular pattern is normal. Multifocal airspace disease throughout the bilateral mid to lower lung zones, left worse than right. No pleural effusion. IMPRESSION: Moderately severe multifocal bilateral nonspecific airspace disease. One consideration would be pneumonia. Electronically Signed   By: Esperanza Heir M.D.   On: 03/15/2015 11:19   Ct Chest Wo Contrast  03/22/2015  CLINICAL DATA:  65 year old female with extensive history of malignancy. Evaluate for underlying neoplasm. EXAM: CT CHEST WITHOUT CONTRAST TECHNIQUE: Multidetector CT imaging of the chest was performed following the standard protocol without IV contrast. COMPARISON:  Multiple recent prior chest x-rays most recently earlier today 5:28 a.m. FINDINGS: Mediastinum: Unremarkable CT appearance of the thyroid gland. No suspicious mediastinal or hilar adenopathy. No soft tissue mediastinal mass. The thoracic esophagus is unremarkable and conveys a nasogastric tube into the stomach. Heart/Vascular: Borderline cardiomegaly. Small pericardial effusion. Caseous calcification of the mitral valve annulus. Scattered atherosclerotic calcifications along  the course of the coronary arteries. Lungs/Pleura: Multifocal regions of airspace consolidation and ground-glass attenuation opacity involving the dependent portions of the right upper and bilateral lower lobes as well as the periphery of these left greater than right upper lobes and the right middle lobe. Respiratory motion artifact is present. No definite pleural effusion. Bones/Soft Tissues: No acute fracture or aggressive appearing lytic or blastic osseous lesion. Multilevel degenerative disc disease throughout the thoracic spine. Upper Abdomen: Nasogastric tube coiled in the stomach. Otherwise, the imaged abdomen is unremarkable. IMPRESSION: 1. Extensive ground-glass attenuation airspace opacity and airspace consolidation involving all lobes of both lungs but predominantly the dependent portions of the lower lobes. Differential considerations include multifocal pneumonia, aspiration, and less likely asymmetric or noncardiogenic pulmonary edema. Atypical processes including eosinophilic pneumonitis and organizing pneumonia are also considerations. 2. Borderline cardiomegaly. 3. Small pericardial effusion. 4. Coronary artery calcifications. Electronically Signed   By: Malachy Moan M.D.   On: 03/22/2015 16:14   Dg Chest Port 1 View  03/28/2015  CLINICAL DATA:  Patient with respiratory failure. EXAM: PORTABLE CHEST 1 VIEW COMPARISON:  Chest radiograph 03/26/2015. FINDINGS: Interval removal central venous catheter. Stable cardiac and mediastinal contours. Low lung volumes. Improving bilateral mid and lower lung airspace opacities. No definite pneumothorax. Possible small bilateral pleural effusions. IMPRESSION: Interval removal central venous catheter. Slight  interval improvement bilateral airspace opacities which may represent improving edema or infection. Possible small bilateral pleural effusions. Electronically Signed   By: Annia Belt M.D.   On: 03/28/2015 11:23   Dg Chest Port 1 View  03/26/2015   CLINICAL DATA:  Respiratory failure. EXAM: PORTABLE CHEST 1 VIEW COMPARISON:  03/25/2015. FINDINGS: Left IJ line in stable position. Cardiomegaly with bilateral pulmonary infiltrates suggesting pulmonary edema. Bilateral pneumonia cannot be excluded. Infiltrates have progressed from prior exam. Small left pleural effusion. No pneumothorax. IMPRESSION: 1. Left IJ line in stable position. 2. Cardiomegaly with progressive bilateral pulmonary infiltrates suggesting pulmonary edema. Bilateral pneumonia cannot be excluded. Small left pleural effusion. Electronically Signed   By: Maisie Fus  Register   On: 03/26/2015 07:12   Dg Chest Port 1 View  03/25/2015  CLINICAL DATA:  65 year old female with acute respiratory failure. EXAM: PORTABLE CHEST 1 VIEW COMPARISON:  Chest radiograph dated 03/24/2015 FINDINGS: There has been interval removal of the endotracheal and enteric tube. A left IJ central venous line with tip overlying the central SVC again noted. There is stable cardiac silhouette. Bilateral lower lung field airspace opacities appear grossly stable or slightly improved compared to prior study. A small left pleural effusion may be present. No acute osseous pathology. IMPRESSION: Interval removal of the endotracheal and enteric tube. Grossly stable or minimally improved bilateral lower lung field airspace opacities. Electronically Signed   By: Elgie Collard M.D.   On: 03/25/2015 07:22   Dg Chest Port 1 View  03/24/2015  CLINICAL DATA:  Acute respiratory failure. EXAM: PORTABLE CHEST 1 VIEW COMPARISON:  03/23/2015. FINDINGS: Endotracheal tube in satisfactory position. Nasogastric tube extending into the stomach. Left jugular catheter tip in the superior vena cava. Stable enlarged cardiac silhouette and prominent pulmonary vasculature. Mildly decreased airspace opacity in both lower lung zones. No visible pleural fluid. Thoracic spine degenerative changes. IMPRESSION: 1. Mildly improved bibasilar pneumonia,  aspiration pneumonitis or alveolar edema. 2. Stable cardiomegaly and pulmonary vascular congestion. Electronically Signed   By: Beckie Salts M.D.   On: 03/24/2015 07:51   Dg Chest Port 1 View  03/23/2015  CLINICAL DATA:  65 year old female currently intubated EXAM: PORTABLE CHEST 1 VIEW COMPARISON:  Prior chest x-ray and CT scan of the chest 03/22/2015 FINDINGS: The endotracheal tube is 2.4 cm above the carina. A nasogastric tube is present and coiled within the stomach. Left IJ central venous catheter the tip of which overlies the mid SVC. Stable cardiac and mediastinal contours. Bilateral patchy interstitial and airspace opacities most prominent in the lower lobes. No pneumothorax. No large pleural effusion. No acute osseous abnormality. IMPRESSION: 1. The tip the endotracheal tube is 2.4 cm above the carina. 2. Left IJ approach central venous catheter with the tip overlying the mid SVC. 3. Persistent diffuse bilateral interstitial and airspace opacities most confluent in the lower lobes concerning for multifocal pneumonia versus aspiration. Electronically Signed   By: Malachy Moan M.D.   On: 03/23/2015 07:37   Dg Chest Port 1 View  03/22/2015  CLINICAL DATA:  Intubated EXAM: PORTABLE CHEST 1 VIEW COMPARISON:  03/21/2015 FINDINGS: Cardiomegaly again noted. Stable endotracheal and NG tube position. Persistent patchy airspace disease/pneumonia bilaterally. Slight improvement in aeration right midlung. Question small left pleural effusion. Central mild vascular congestion without convincing pulmonary edema. Stable left IJ central line with tip in SVC. IMPRESSION: Stable support apparatus. Persistent patchy airspace disease/pneumonia bilaterally. Slight improvement in aeration right midlung. Question small left pleural effusion. Central mild vascular congestion without convincing pulmonary  edema. Electronically Signed   By: Natasha Mead M.D.   On: 03/22/2015 08:13   Portable Chest Xray  03/21/2015   CLINICAL DATA:  Acute on chronic respiratory failure, community-acquired pneumonia, acute CHF, known aortic and mitral stenosis EXAM: PORTABLE CHEST 1 VIEW COMPARISON:  Portable chest x-ray of March 20, 2015 FINDINGS: The lungs are well-expanded. The left hemidiaphragm is slightly better demonstrated today. Patchy confluent interstitial and alveolar opacities persist bilaterally. The cardiac silhouette is enlarged. The pulmonary vascularity is mildly prominent centrally but stable. The endotracheal tube tip lies 3 cm above the carina. The esophagogastric tube tip projects below the inferior margin of the image. The left internal jugular venous catheter tip projects over the midportion of the SVC. IMPRESSION: Persistent bilateral pneumonia. Superimposed low-grade CHF. Slight interval improvement since yesterday's study. The support tubes are in reasonable position. Electronically Signed   By: David  [place] M.D.   On: 03/21/2015 07:44   Dg Chest Port 1 View  03/20/2015  CLINICAL DATA:  Encounter for intubation, bronchoscopy and central line placement. EXAM: PORTABLE CHEST 1 VIEW COMPARISON:  03/20/2015 at 7:20 a.m. FINDINGS: Endotracheal tube tip projects 3.4 cm about the carina. Nasal/ orogastric tube passes below the diaphragm well into the stomach below the included field of view. Left internal jugular central venous line tip projects in the lower superior vena cava. No pneumothorax. Bilateral lung opacities are without significant change from the earlier study allowing differences in positioning and technique. No new lung abnormalities. IMPRESSION: 1. Endotracheal tube, nasal/orogastric tube and left internal jugular central venous line are well positioned. 2. No pneumothorax. Electronically Signed   By: Amie Portland M.D.   On: 03/20/2015 19:32    Assessment/Plan:  1. Acute Hypoxic Respiratory Failure Mixed cardiopulmonary etiology - BNP mildly elevated to 267 on admission. Good output with lasix .  Weight was 226 lbs at time of discharge on 04/04/2015, down to 220 lbs at time of admission. Her weight has not been consistently recorded this admission.  - CXR on admission showed increasing bilateral airspace infiltrates which may indicate edema or PNA. She has been started on Vancomycin and Aztreonam for possible PNA per the admitting team. She reported "significant improvement" in her breathing following her nebulizer treatment.  Overnight developed acute respiratory failure.  Refused BiPAP.  Currently in respiratory distress.  Cxray with diffuse infiltrative process ? ARDS vs. Edema.  Suspect this is more related to worsening PNA/Flu.  Given IV Lasix 40mg  now.  Pulmonology following. There is concern for inflammatory or interstitial lung disease. Her Sed rate had been elevated to 96 last admission, improved to 34 this admission.  - check BNP - Need to be careful not to drop preload too much with diuretics given her significant HOCM.  2. Elevated Troponin - cyclic troponin values have been 0.33 and 0.35 (down-trending from 0.91 three weeks ago). - EKG without acute changes. She denies any recent chest pain.  - had recent cardiac catheterization on 03/29/2015 which showed mild non-obstructive CAD. - No further ischemic evaluation indicated at this time with her recent cath results.   3. HOCM - severe focal basal hypertrophy of the septum with moderate hypertrophy of the posterior wall was noted on recent echo last admission. EF was 75%.  - Beta blocker increased to maximize diastolic filling time   4. Mitral Valve Nodule with moderate MS on echo - - given acute respiratory decompensation with repeat echo  5.  Moderate AS on echo    Rachel Baker,Rachel R,  MD  04/13/2015  8:23 AM

## 2015-04-13 NOTE — Progress Notes (Signed)
eLink Physician-Brief Progress Note Patient Name: Rachel Baker DOB: Oct 10, 1950 MRN: [id number]   Date of Service  04/13/2015  HPI/Events of Note  Troponin = 1.26. Patient is currently on ASA and Toprol.   eICU Interventions  Will add Heparin IV infusion per pharmacy protocol.     Intervention Category Intermediate Interventions: Diagnostic test evaluation  Lenell Antu 04/13/2015, 10:57 PM

## 2015-04-13 NOTE — Progress Notes (Signed)
CRITICAL VALUE ALERT  Critical value received:  Trop= 1.26  Date of notification:  04/13/15  Time of notification:  2212  Critical value read back:Yes.    Nurse who received alert:  Dustin Flock  MD notified (1st page): Dr Arsenio Loader  Time of first page:  2222  MD notified (2nd page):  Time of second page:  Responding MD:  Dr Arsenio Loader  Time MD responded:  2224

## 2015-04-13 NOTE — Progress Notes (Signed)
PULMONARY / CRITICAL CARE MEDICINE   Name: Rachel Baker MRN: [id number] DOB: 1950/04/23    ADMISSION DATE:  04/21/2015 CONSULTATION DATE:  04/11/2015  REFERRING MD:  [facility]  CHIEF COMPLAINT:  Short of breath  SUBJECTIVE:  Intubated this morning.  VITAL SIGNS: BP 141/68 mmHg  Pulse 78  Temp(Src) 98.5 F (36.9 C) (Axillary)  Resp 37  Ht 5\' 3"  (1.6 m)  Wt 219 lb 3.2 oz (99.428 kg)  BMI 38.84 kg/m2  SpO2 93%  VENTILATOR SETTINGS: Vent Mode:  [-] BIPAP FiO2 (%):  [100 %] 100 % Set Rate:  [16 bmp] 16 bmp PEEP:  [5 cmH20] 5 cmH20  INTAKE / OUTPUT: I/O last 3 completed shifts: In: 1090 [P.O.:840; IV Piggyback:250] Out: 850 [Urine:850]  PHYSICAL EXAMINATION: General: anxious Neuro:  RASS +1 HEENT:  ETT in place Cardiovascular:  Regular, 2/6 SM  Lungs:  B/l crackles Abdomen:  Soft, non tender Musculoskeletal:  No edema Skin:  No rashes  LABS:  BMET  Recent Labs Lab 04/11/15 0438 04/12/15 0310 04/13/15 0502  NA 141 139 142  K 4.3 4.3 4.1  CL 102 101 104  CO2 24 21* 26  BUN 15 20 19   CREATININE 0.79 0.94 0.66  GLUCOSE 92 149* 132*    Electrolytes  Recent Labs Lab 04/11/15 0438 04/12/15 0310 04/13/15 0502  CALCIUM 8.8* 8.9 9.1  MG  --  2.2  --     CBC  Recent Labs Lab 04/11/15 0438 04/12/15 0310 04/13/15 0502  WBC 15.3* 16.8* 22.6*  HGB 12.7 12.9 12.4  HCT 40.1 40.7 39.9  PLT 231 298 241    Coag's No results for input(s): APTT, INR in the last 168 hours.  Sepsis Markers  Recent Labs Lab 04/11/15 0150 04/11/15 0233 04/11/15 0439 04/13/15 0502  LATICACIDVEN 1.6  --  2.2*  --   PROCALCITON  --  <0.10  --  <0.10    ABG  Recent Labs Lab 04/11/15 0247 04/12/15 2205 04/13/15 0455  PHART 7.471* 7.438 7.442  PCO2ART 34.1* 38.9 40.5  PO2ART 46.0* 55.3* 58.6*    Liver Enzymes  Recent Labs Lab 04/11/15 0438  AST 37  ALT 21  ALKPHOS 63  BILITOT 2.7*  ALBUMIN 2.7*    Cardiac Enzymes  Recent Labs Lab  04/11/15 0150 04/11/15 0738 04/11/15 1332  TROPONINI 0.33* 0.35* 0.25*    Glucose  Recent Labs Lab 04/12/15 1156 04/12/15 1610 04/12/15 2120 04/13/15 0745  GLUCAP 123* 114* 136* 87    Imaging No results found.   STUDIES:   CULTURES: 3/09 Influenza PCR >> Flu B positive  ANTIBIOTICS: 3/08 Vancomycin >> 3/08 Azactam >> 3/09 Tamiflu >>   SIGNIFICANT EVENTS: 3/08 Admit 3/11 VDRF, to ICU  LINES/TUBES:  DISCUSSION: 65 yo female former smoker presented from NH with dyspnea.  Found to have influenza B.  Was in hospital in February 2017 with pulmonary infiltrates of unclear cause >> serology, bronchoscopy negative.  ASSESSMENT / PLAN:  PULMONARY A: Acute respiratory failure with hypoxia 2nd to Influenza B. P:   Full vent support F/u CXR, ABG Scheduled BDs Continue solumedrol  CARDIOVASCULAR A:  Hx of Hypertrophic CM, mitral valve nodule. P:  Continue toprol, ASA  RENAL A:   No acute issues. P:   Monitor renal fx, urine outpt  GASTROINTESTINAL A:   Morbid obesity. Hx of GERD. P:   Protonix Tube feeds while on vent  HEMATOLOGIC A:   Leukocytosis. P:  F/u CBC SQ heparin for DVT prophylaxis  INFECTIOUS  A:   Pneumonia 2nd to influenza B. P:   Continue tamiflu, cefepime, vancomycin  ENDOCRINE A:   Steroid induced hyperglycemia. P:   SSI while on solumedrol  NEUROLOGIC A:   Acute encephalopathy 2nd to respiratory failure. P:   RASS goal -2  CC 33 minutes.  Coralyn Helling, MD [facility] Pulmonary/Critical Care 04/13/2015, 8:34 AM Pager:  [phone] After 3pm call: [phone]

## 2015-04-13 NOTE — Progress Notes (Addendum)
Initial Nutrition Assessment  DOCUMENTATION CODES:   Obesity unspecified  INTERVENTION:    Initiate TF via OGT with Vital High Protein at 25 ml/h and Prostat 30 ml BID on day 1; on day 2, d/c Prostat and increase to goal rate of 50 ml/h (1200 ml per day) to provide 1200 kcals, 105 gm protein, 1003 ml free water daily.  Total intake with TF and Propofol will be 1446 kcals (100% of estimated needs) and 105 gm protein (100% of estimated needs).  NUTRITION DIAGNOSIS:   Inadequate oral intake related to inability to eat as evidenced by NPO status.  GOAL:   Provide needs based on ASPEN/SCCM guidelines  MONITOR:   TF tolerance, Vent status, Labs, I & O's  REASON FOR ASSESSMENT:   Consult Enteral/tube feeding initiation and management  ASSESSMENT:   65 y.o. female who was recently admitted last month for shortness of breath and at that time patient also was intubated for respiratory failure. She was eventually discharged to rehabilitation and was brought to the ER on 3/8  after patient acutely became short of breath. She required intubation and transfer to the ICU on 3/11.  Received MD Consult for TF initiation and management. Nutrition-Focused physical exam completed. Findings are no fat depletion, mild-moderate and severe muscle depletion, and mild edema.   Patient is currently intubated on ventilator support MV: 11.8 L/min Temp (24hrs), Avg:97.9 F (36.6 C), Min:96.9 F (36.1 C), Max:98.6 F (37 C)  Propofol: 9.3 ml/hr providing 246 kcals  Diet Order:  Diet NPO time specified  Skin:  Reviewed, no issues  Last BM:  3/9  Height:   Ht Readings from Last 1 Encounters:  04/11/15 5\' 3"  (1.6 m)    Weight:   Wt Readings from Last 1 Encounters:  04/13/15 226 lb 3.1 oz (102.6 kg)    Ideal Body Weight:  52.3 kg  BMI:  Body mass index is 40.08 kg/(m^2).  Estimated Nutritional Needs:   Kcal:  06/13/15  Protein:  >/= 105 gm  Fluid:  >/= 1.5 L  EDUCATION NEEDS:    No education needs identified at this time  [id number], RD, LDN, CNSC Pager [phone] After Hours Pager [phone]

## 2015-04-13 NOTE — Progress Notes (Signed)
ANTICOAGULATION CONSULT NOTE - Initial Consult  Pharmacy Consult for Heparin Indication: chest pain/ACS  Allergies  Allergen Reactions  . Penicillins Hives and Itching    Has patient had a PCN reaction causing immediate rash, facial/tongue/throat swelling, SOB or lightheadedness with hypotension: Yes Has patient had a PCN reaction causing severe rash involving mucus membranes or skin necrosis: No Has patient had a PCN reaction that required hospitalization No Has patient had a PCN reaction occurring within the last 10 years: No If all of the above answers are "NO", then may proceed with Cephalosporin use.   . Sulfa Antibiotics Hives and Itching  . Ceclor [Cefaclor] Itching and Rash    Patient Measurements: Height: 5\' 3"  (160 cm) Weight: 226 lb 3.1 oz (102.6 kg) IBW/kg (Calculated) : 52.4 Heparin Dosing Weight: 76 kg  Vital Signs: Temp: 98.2 F (36.8 C) (03/11 1950) Temp Source: Oral (03/11 1950) BP: 112/48 mmHg (03/11 2200) Pulse Rate: 85 (03/11 2245)  Labs:  Recent Labs  04/11/15 0438  04/11/15 1332 04/12/15 0310 04/13/15 0502 04/13/15 1006 04/13/15 2105  HGB 12.7  --   --  12.9 12.4  --   --   HCT 40.1  --   --  40.7 39.9  --   --   PLT 231  --   --  298 241  --   --   CREATININE 0.79  --   --  0.94 0.66  --   --   TROPONINI  --   < > 0.25*  --   --  0.49* 1.26*  < > = values in this interval not displayed.  Estimated Creatinine Clearance: 80.2 mL/min (by C-G formula based on Cr of 0.66).   Medical History: Past Medical History  Diagnosis Date  . CAP (community acquired pneumonia) 03/15/2015  . Family history of adverse reaction to anesthesia     "my mother got combative"  . Complication of anesthesia     "I hallucinated when I had teeth worked on"  . Sleep apnea     "need breath rite strip sometimes" (03/15/2015)  . History of blood transfusion     "w/tooth implant"  . GERD (gastroesophageal reflux disease)   . Ocular migraine     "all the time"  (03/15/2015)  . Arthritis     "all over; different joints" (03/15/2015)  . CAD (coronary artery disease)     a. mild non-obstructive CAD by cath in 03/2015.    Medications:  Prescriptions prior to admission  Medication Sig Dispense Refill Last Dose  . ALPRAZolam (XANAX) 0.5 MG tablet Take 1 tablet (0.5 mg total) by mouth 3 (three) times daily as needed for anxiety. 10 tablet 0 unknown  . alum & mag hydroxide-simeth (MAALOX/MYLANTA) 200-200-20 MG/5ML suspension Take 15 mLs by mouth every 4 (four) hours as needed for indigestion or heartburn. 355 mL 0 unknown at Unknown time  . aspirin 81 MG chewable tablet Chew 1 tablet (81 mg total) by mouth daily.   04/11/2015 at Unknown time  . metoprolol succinate (TOPROL-XL) 50 MG 24 hr tablet Take 50 mg by mouth 2 (two) times daily. Take with or immediately following a meal.   04/11/2015 at 1800  . pantoprazole (PROTONIX) 40 MG tablet Take 1 tablet (40 mg total) by mouth 2 (two) times daily before a meal.   04/11/2015 at Unknown time  . predniSONE (DELTASONE) 10 MG tablet Take 10 mg by mouth daily. Stop date 04/12/15   04/11/2015 at Unknown time  . metoprolol (  LOPRESSOR) 50 MG tablet Take 1 tablet (50 mg total) by mouth 2 (two) times daily. (Patient not taking: Reported on 04/11/2015)   Not Taking at Unknown time  . predniSONE (DELTASONE) 10 MG tablet Take 10 mg by mouth daily. Stop date 04/20/15   Taking  . predniSONE (DELTASONE) 20 MG tablet Take 20 mg by mouth daily.   Taking    Assessment: 64 y.o. F known to pharmacy from abx dosing. Now with elevated trop. To begin heparin for r/o ACS. CBC stable.  Goal of Therapy:  Heparin level 0.3-0.7 units/ml Monitor platelets by anticoagulation protocol: Yes   Plan:  Heparin IV bolus 4000 units Heparin gtt at 1000 units/hr Will f/u heparin level in 6 hours Daily heparin level and CBC  Christoper Fabian, PharmD, BCPS Clinical pharmacist, pager [phone] 04/13/2015,10:58 PM

## 2015-04-13 NOTE — Progress Notes (Signed)
   04/13/15 0907  Clinical Encounter Type  Visited With Health care provider  Visit Type Initial;Critical Care  Referral From Nurse   Chaplain responded to a request from 3W to support patient's family as they seemed to be emotional about patient's health changes and recent intubation. Chaplain checked in on and searched the waiting area multiple times without finding the family. Chaplain support available as needed.   Alda Ponder, Chaplain 04/13/2015 9:09 AM

## 2015-04-13 NOTE — Progress Notes (Signed)
Repeat ABG this AM showing little improvement in pAO2 at 58%. No acid/base imbalance, as before. Patient's O2 saturations holding at 92-96% on 100% O2 per non-rebreather mask. Other vital signs grossly unchanged. However, she is looking and feeling very tired; respirations have been sustained >35 since beginning of this shift.  Discussed with patient at bedside and recommended BiPAP to aid with oxygenation. Patient declines at this time; requests provider consultation to discuss treatment options.  NP for [facility], Elray Mcgregor, paged to make aware. Given timing of patient request, will pass-on to MD to address on AM rounds.  Continuing to monitor closely.

## 2015-04-13 NOTE — Progress Notes (Signed)
Patient continued with Respiratory distress on norebreather.  Respiratory therapy called to apply bipap.  Dr. Janee Morn up to see patient and feels need intubation.  Respiratory Therapy updated.  Anesthesia called to assist.  Rapid Response notified.  Bed request for ICU made.  Anesthesia up to intubate with the assistance of Respiratory Therapy.  Patient transferred to 31M.  Colman Cater

## 2015-04-13 NOTE — Progress Notes (Signed)
Per RN pt wanted to be taken off Bipap about an hour ago. RT had to stop breathing tx about 2 minutes in due to SpO2 in the 70s. Pt placed back on NRB and SpO2 now WNL.

## 2015-04-14 ENCOUNTER — Inpatient Hospital Stay (HOSPITAL_COMMUNITY): Payer: Medicare HMO

## 2015-04-14 LAB — GLUCOSE, CAPILLARY
GLUCOSE-CAPILLARY: 131 mg/dL — AB (ref 65–99)
GLUCOSE-CAPILLARY: 140 mg/dL — AB (ref 65–99)
GLUCOSE-CAPILLARY: 140 mg/dL — AB (ref 65–99)
GLUCOSE-CAPILLARY: 162 mg/dL — AB (ref 65–99)
Glucose-Capillary: 138 mg/dL — ABNORMAL HIGH (ref 65–99)
Glucose-Capillary: 170 mg/dL — ABNORMAL HIGH (ref 65–99)
Glucose-Capillary: 101 mg/dL — ABNORMAL HIGH (ref 65–99)

## 2015-04-14 LAB — BLOOD GAS, ARTERIAL
Acid-base deficit: 2.5 mmol/L — ABNORMAL HIGH (ref 0.0–2.0)
BICARBONATE: 25.7 meq/L — AB (ref 20.0–24.0)
Drawn by: 23604
FIO2: 0.7
LHR: 30 {breaths}/min
O2 Saturation: 98.7 %
PEEP: 12 cmH2O
PO2 ART: 157 mmHg — AB (ref 80.0–100.0)
Patient temperature: 98.6
TCO2: 28.2 mmol/L (ref 0–100)
VT: 320 mL
pCO2 arterial: 81.2 mmHg (ref 35.0–45.0)
pH, Arterial: 7.128 — CL (ref 7.350–7.450)

## 2015-04-14 LAB — POCT I-STAT 3, ART BLOOD GAS (G3+)
ACID-BASE DEFICIT: 3 mmol/L — AB (ref 0.0–2.0)
ACID-BASE DEFICIT: 3 mmol/L — AB (ref 0.0–2.0)
Acid-base deficit: 3 mmol/L — ABNORMAL HIGH (ref 0.0–2.0)
BICARBONATE: 24.5 meq/L — AB (ref 20.0–24.0)
Bicarbonate: 29.2 mEq/L — ABNORMAL HIGH (ref 20.0–24.0)
Bicarbonate: 27 mEq/L — ABNORMAL HIGH (ref 20.0–24.0)
O2 SAT: 97 %
O2 SAT: 98 %
O2 Saturation: 97 %
PH ART: 7.194 — AB (ref 7.350–7.450)
PO2 ART: 110 mmHg — AB (ref 80.0–100.0)
Patient temperature: 97.6
TCO2: 32 mmol/L (ref 0–100)
TCO2: 29 mmol/L (ref 0–100)
TCO2: 26 mmol/L (ref 0–100)
pCO2 arterial: 91.9 mmHg (ref 35.0–45.0)
pCO2 arterial: 69.4 mmHg (ref 35.0–45.0)
pCO2 arterial: 52.7 mmHg — ABNORMAL HIGH (ref 35.0–45.0)
pH, Arterial: 7.106 — CL (ref 7.350–7.450)
pH, Arterial: 7.275 — ABNORMAL LOW (ref 7.350–7.450)
pO2, Arterial: 123 mmHg — ABNORMAL HIGH (ref 80.0–100.0)
pO2, Arterial: 120 mmHg — ABNORMAL HIGH (ref 80.0–100.0)

## 2015-04-14 LAB — CBC
HCT: 36 % (ref 36.0–46.0)
Hemoglobin: 10.7 g/dL — ABNORMAL LOW (ref 12.0–15.0)
MCH: 28.7 pg (ref 26.0–34.0)
MCHC: 29.7 g/dL — ABNORMAL LOW (ref 30.0–36.0)
MCV: 96.5 fL (ref 78.0–100.0)
PLATELETS: 258 10*3/uL (ref 150–400)
RBC: 3.73 MIL/uL — AB (ref 3.87–5.11)
RDW: 16.4 % — AB (ref 11.5–15.5)
WBC: 24.1 10*3/uL — AB (ref 4.0–10.5)

## 2015-04-14 LAB — BASIC METABOLIC PANEL
ANION GAP: 7 (ref 5–15)
BUN: 36 mg/dL — ABNORMAL HIGH (ref 6–20)
CALCIUM: 8.7 mg/dL — AB (ref 8.9–10.3)
CO2: 26 mmol/L (ref 22–32)
CREATININE: 1.51 mg/dL — AB (ref 0.44–1.00)
Chloride: 106 mmol/L (ref 101–111)
GFR, EST AFRICAN AMERICAN: 41 mL/min — AB (ref >60)
GFR, EST NON AFRICAN AMERICAN: 35 mL/min — AB (ref >60)
Glucose, Bld: 161 mg/dL — ABNORMAL HIGH (ref 65–99)
Potassium: 4.2 mmol/L (ref 3.5–5.1)
Sodium: 139 mmol/L (ref 135–145)

## 2015-04-14 LAB — PHOSPHORUS
PHOSPHORUS: 6.5 mg/dL — AB (ref 2.5–4.6)
Phosphorus: 5.9 mg/dL — ABNORMAL HIGH (ref 2.5–4.6)

## 2015-04-14 LAB — MAGNESIUM
MAGNESIUM: 2.3 mg/dL (ref 1.7–2.4)
MAGNESIUM: 2.2 mg/dL (ref 1.7–2.4)

## 2015-04-14 LAB — HEPARIN LEVEL (UNFRACTIONATED)
Heparin Unfractionated: 0.44 IU/mL (ref 0.30–0.70)
Heparin Unfractionated: 0.24 IU/mL — ABNORMAL LOW (ref 0.30–0.70)
Heparin Unfractionated: 0.25 IU/mL — ABNORMAL LOW (ref 0.30–0.70)

## 2015-04-14 LAB — BRAIN NATRIURETIC PEPTIDE: B NATRIURETIC PEPTIDE 5: 735.2 pg/mL — AB (ref 0.0–100.0)

## 2015-04-14 MED ORDER — VITAL HIGH PROTEIN PO LIQD: 1000.0000 mL | ORAL | Status: DC

## 2015-04-14 MED ORDER — OSELTAMIVIR PHOSPHATE 6 MG/ML PO SUSR
30.0000 mg | Freq: Two times a day (BID) | ORAL | Status: DC
  Administered 2015-04-14 – 2015-04-15 (×3): 30 mg
  Filled 2015-04-14 (×4): qty 5

## 2015-04-14 MED ORDER — NOREPINEPHRINE BITARTRATE 1 MG/ML IV SOLN
0.0000 ug/min | INTRAVENOUS | Status: DC
  Administered 2015-04-14: 10 ug/min via INTRAVENOUS
  Filled 2015-04-14: qty 16

## 2015-04-14 NOTE — Progress Notes (Signed)
ANTICOAGULATION CONSULT NOTE - Initial Consult  Pharmacy Consult for Heparin Indication: chest pain/ACS  Allergies  Allergen Reactions  . Penicillins Hives and Itching    Has patient had a PCN reaction causing immediate rash, facial/tongue/throat swelling, SOB or lightheadedness with hypotension: Yes Has patient had a PCN reaction causing severe rash involving mucus membranes or skin necrosis: No Has patient had a PCN reaction that required hospitalization No Has patient had a PCN reaction occurring within the last 10 years: No If all of the above answers are "NO", then may proceed with Cephalosporin use.   . Sulfa Antibiotics Hives and Itching  . Ceclor [Cefaclor] Itching and Rash    Patient Measurements: Height: 5\' 3"  (160 cm) Weight: 228 lb 9.9 oz (103.7 kg) IBW/kg (Calculated) : 52.4 Heparin Dosing Weight: 76 kg  Vital Signs: Temp: 97.2 F (36.2 C) (03/12 0900) Temp Source: Oral (03/12 0900) BP: 108/43 mmHg (03/12 1000) Pulse Rate: 68 (03/12 1045)  Labs:  Recent Labs  04/11/15 1332  04/12/15 0310 04/13/15 0502 04/13/15 1006 04/13/15 2105 04/14/15 0515 04/14/15 0527 04/14/15 1049  HGB  --   < > 12.9 12.4  --   --   --   --  10.7*  HCT  --   --  40.7 39.9  --   --   --   --  36.0  PLT  --   --  298 241  --   --   --   --  258  HEPARINUNFRC  --   --   --   --   --   --   --  0.44 0.24*  CREATININE  --   --  0.94 0.66  --   --  1.51*  --   --   TROPONINI 0.25*  --   --   --  0.49* 1.26*  --   --   --   < > = values in this interval not displayed.  Estimated Creatinine Clearance: 42.7 mL/min (by C-G formula based on Cr of 1.51).   Medical History: Past Medical History  Diagnosis Date  . CAP (community acquired pneumonia) 03/15/2015  . Family history of adverse reaction to anesthesia     "my mother got combative"  . Complication of anesthesia     "I hallucinated when I had teeth worked on"  . Sleep apnea     "need breath rite strip sometimes" (03/15/2015)   . History of blood transfusion     "w/tooth implant"  . GERD (gastroesophageal reflux disease)   . Ocular migraine     "all the time" (03/15/2015)  . Arthritis     "all over; different joints" (03/15/2015)  . CAD (coronary artery disease)     a. mild non-obstructive CAD by cath in 03/2015.    Medications:  Prescriptions prior to admission  Medication Sig Dispense Refill Last Dose  . ALPRAZolam (XANAX) 0.5 MG tablet Take 1 tablet (0.5 mg total) by mouth 3 (three) times daily as needed for anxiety. 10 tablet 0 unknown  . alum & mag hydroxide-simeth (MAALOX/MYLANTA) 200-200-20 MG/5ML suspension Take 15 mLs by mouth every 4 (four) hours as needed for indigestion or heartburn. 355 mL 0 unknown at Unknown time  . aspirin 81 MG chewable tablet Chew 1 tablet (81 mg total) by mouth daily.   04/11/2015 at Unknown time  . metoprolol succinate (TOPROL-XL) 50 MG 24 hr tablet Take 50 mg by mouth 2 (two) times daily. Take with or immediately following a  meal.   04/11/2015 at 1800  . pantoprazole (PROTONIX) 40 MG tablet Take 1 tablet (40 mg total) by mouth 2 (two) times daily before a meal.   04/11/2015 at Unknown time  . predniSONE (DELTASONE) 10 MG tablet Take 10 mg by mouth daily. Stop date 04/12/15   04/11/2015 at Unknown time  . metoprolol (LOPRESSOR) 50 MG tablet Take 1 tablet (50 mg total) by mouth 2 (two) times daily. (Patient not taking: Reported on 04/11/2015)   Not Taking at Unknown time  . predniSONE (DELTASONE) 10 MG tablet Take 10 mg by mouth daily. Stop date 04/20/15   Taking  . predniSONE (DELTASONE) 20 MG tablet Take 20 mg by mouth daily.   Taking    Assessment: 65 y.o. F known to pharmacy from abx dosing. Now with elevated trop. To begin heparin for r/o ACS. CBC stable. Heparin level SUBtherapeutic, not planning on cath. 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  Goal of Therapy:  Heparin level 0.3-0.7 units/ml Monitor platelets by anticoagulation protocol: Yes   Plan:   Heparin gtt at 1150 units/hr Will f/u heparin level in 6 hours Daily heparin level and CBC    Agapito Games, PharmD, BCPS Clinical Pharmacist Pager: [phone] 04/14/2015 11:57 AM

## 2015-04-14 NOTE — Progress Notes (Signed)
ANTICOAGULATION CONSULT NOTE - Follow Up Consult  Pharmacy Consult for heparin Indication: chest pain/ACS   Labs:  Recent Labs  04/11/15 1332 04/12/15 0310 04/13/15 0502 04/13/15 1006 04/13/15 2105 04/14/15 0515 04/14/15 0527  HGB  --  12.9 12.4  --   --   --   --   HCT  --  40.7 39.9  --   --   --   --   PLT  --  298 241  --   --   --   --   HEPARINUNFRC  --   --   --   --   --   --  0.44  CREATININE  --  0.94 0.66  --   --  1.51*  --   TROPONINI 0.25*  --   --  0.49* 1.26*  --   --      Assessment/Plan:  65yo female therapeutic on heparin with initial dosing for elevated troponin. Will continue gtt at current rate and confirm stable with additional level.   Vernard Gambles, PharmD, BCPS  04/14/2015,6:52 AM

## 2015-04-14 NOTE — Progress Notes (Signed)
CRITICAL VALUE ALERT  Critical value received:  PH= 7.194, pCO2= 69.4  Date of notification:  04/14/15   Time of notification:  0650  Critical value read back:Yes.    Nurse who received alert:  Dustin Flock  MD notified (1st page):  Dr Belinda Block  Time of first page:  0654  MD notified (2nd page):  Time of second page:  Responding MD:  Dr Belinda Block  Time MD responded:  [phone]

## 2015-04-14 NOTE — Progress Notes (Signed)
CRITICAL VALUE ALERT  Critical value received: pH= 7.106, pCO2= 94.1  Date of notification:  04/14/15  Time of notification:  0510  Critical value read back:Yes.    Nurse who received alert:  Dustin Flock  MD notified (1st page):  Dr Katrinka Blazing  Time of first page:  0534  MD notified (2nd page):  Time of second page:  Responding MD:  Dr Katrinka Blazing  Time MD responded:  [phone]

## 2015-04-14 NOTE — Progress Notes (Signed)
eLink Physician-Brief Progress Note Patient Name: Rachel Baker DOB: 1950/07/04 MRN: [id number]   Date of Service  04/14/2015  HPI/Events of Note  Patient with worsening acidemia from hypercarbia in the setting ARDS. PCO2 92.  Present RR 34  eICU Interventions  Pull back ETT 2 cm based on cxr Increase TV to 360 Repeat abg in 1 hr     Intervention Category Major Interventions: Acid-Base disturbance - evaluation and management  Henry Russel, P 04/14/2015, 5:34 AM

## 2015-04-14 NOTE — Progress Notes (Signed)
Dr Craige Cotta made aware of urine output

## 2015-04-14 NOTE — Progress Notes (Signed)
PULMONARY / CRITICAL CARE MEDICINE   Name: Rachel Baker MRN: [id number] DOB: 22-Jun-1950    ADMISSION DATE:  2015/04/30 CONSULTATION DATE:  04/11/2015  REFERRING MD:  [facility]  CHIEF COMPLAINT:  Short of breath  SUBJECTIVE:  Worsening acidemia overnight , vent adjustment, gradual improvement w/ decreased PCO2  Elevated Troponin last pm, hep drip started  Remains on pressor support  Required paralytics last pm due to vent dsynchrony   VITAL SIGNS: BP 106/45 mmHg  Pulse 62  Temp(Src) 97.6 F (36.4 C) (Oral)  Resp 35  Ht 5\' 3"  (1.6 m)  Wt 228 lb 9.9 oz (103.7 kg)  BMI 40.51 kg/m2  SpO2 100%  VENTILATOR SETTINGS: Vent Mode:  [-] PRVC FiO2 (%):  [50 %-90 %] 50 % Set Rate:  [30 bmp-35 bmp] 35 bmp Vt Set:  [320 mL-370 mL] 370 mL PEEP:  [10 cmH20-16 cmH20] 10 cmH20 Plateau Pressure:  [21 cmH20-30 cmH20] 28 cmH20  INTAKE / OUTPUT: I/O last 3 completed shifts: In: 3113.5 [I.V.:1678.5; NG/GT:585; IV Piggyback:850] Out: 1285 [Urine:1285]  PHYSICAL EXAMINATION: General: anxious Neuro:  RASS +1 HEENT:  ETT in place Cardiovascular:  Regular, 2/6 SM  Lungs:  B/l crackles Abdomen:  Soft, non tender Musculoskeletal:  No edema Skin:  No rashes  LABS:  BMET  Recent Labs Lab 04/12/15 0310 04/13/15 0502 04/14/15 0515  NA 139 142 139  K 4.3 4.1 4.2  CL 101 104 106  CO2 21* 26 26  BUN 20 19 36*  CREATININE 0.94 0.66 1.51*  GLUCOSE 149* 132* 161*    Electrolytes  Recent Labs Lab 04/12/15 0310 04/13/15 0502 04/13/15 1005 04/13/15 2049 04/14/15 0515  CALCIUM 8.9 9.1  --   --  8.7*  MG 2.2  --  2.2 2.3  --   PHOS  --   --  4.7* 6.4*  --     CBC  Recent Labs Lab 04/11/15 0438 04/12/15 0310 04/13/15 0502  WBC 15.3* 16.8* 22.6*  HGB 12.7 12.9 12.4  HCT 40.1 40.7 39.9  PLT 231 298 241    Coag's No results for input(s): APTT, INR in the last 168 hours.  Sepsis Markers  Recent Labs Lab 04/11/15 0150 04/11/15 0233 04/11/15 0439 04/13/15 0502   LATICACIDVEN 1.6  --  2.2*  --   PROCALCITON  --  <0.10  --  <0.10    ABG  Recent Labs Lab 04/14/15 0345 04/14/15 0513 04/14/15 0650  PHART 7.128* 7.106* 7.194*  PCO2ART 81.2* 91.9* 69.4*  PO2ART 157* 123.0* 120.0*    Liver Enzymes  Recent Labs Lab 04/11/15 0438  AST 37  ALT 21  ALKPHOS 63  BILITOT 2.7*  ALBUMIN 2.7*    Cardiac Enzymes  Recent Labs Lab 04/11/15 1332 04/13/15 1006 04/13/15 2105  TROPONINI 0.25* 0.49* 1.26*    Glucose  Recent Labs Lab 04/13/15 1254 04/13/15 1636 04/13/15 1948 04/14/15 0016 04/14/15 0404 04/14/15 0843  GLUCAP 108* 134* 140* 162* 140* 138*    Imaging Dg Chest Port 1 View  04/14/2015  CLINICAL DATA:  Respiratory failure EXAM: PORTABLE CHEST 1 VIEW COMPARISON:  04/13/2015 FINDINGS: Endotracheal tube terminates 2.5 cm above the carina. Cardiomegaly with suspected mild interstitial edema. Multifocal pneumonia is considered unlikely Small bilateral pleural effusions, right greater than left. No pneumothorax. Left subclavian venous catheter terminates at the cavoatrial junction. Enteric tube courses below the diaphragm. IMPRESSION: Cardiomegaly with suspected mild interstitial edema. Small bilateral pleural effusions, right greater than left. Endotracheal tube terminates 2.5 cm above the carina.  Electronically Signed   By: Charline Bills M.D.   On: 04/14/2015 07:26   Dg Chest Port 1 View  04/13/2015  CLINICAL DATA:  Encounter for central line placement. EXAM: PORTABLE CHEST 1 VIEW COMPARISON:  Earlier this day at 0940 hour FINDINGS: Endotracheal tube 3.6 cm from the carina. Tip of the enteric tube below the diaphragm in the stomach. There is a new left internal jugular central venous catheter with tip projecting over the mid SVC. No evidence of pneumothorax. Patient is significantly rotated to the left. Cardiomediastinal contours are grossly unchanged. Bilateral perihilar opacities, left greater than right, grossly unchanged.  Suspect small pleural effusions. IMPRESSION: 1. Tip of the left central line in the mid SVC. No pneumothorax. Endotracheal and enteric tubes remain in place. 2. Otherwise unchanged appearance of the lungs. Stable bilateral opacities, edema versus infection. Electronically Signed   By: Rubye Oaks M.D.   On: 04/13/2015 18:36   Dg Chest Port 1 View  04/13/2015  CLINICAL DATA:  Evaluate ETT EXAM: PORTABLE CHEST 1 VIEW COMPARISON:  April 13, 2015 FINDINGS: The ETT is in good position. The NG tube terminates in the stomach. No pneumothorax. Diffuse bilateral pulmonary opacities, left greater than right, are similar in the interval. IMPRESSION: Diffuse bilateral pulmonary opacities, left greater than right, similar in the interval. No other changes. Electronically Signed   By: Gerome Sam III M.D   On: 04/13/2015 10:23   Dg Chest Port 1 View  04/13/2015  CLINICAL DATA:  65 year old female with a history of respiratory failure. EXAM: PORTABLE CHEST 1 VIEW COMPARISON:  CT 03/22/2015, most recent plain film 04/11/2015, 03/28/2015, 03/26/2015 FINDINGS: Apical kyphotic positioning. Cardiomediastinal silhouette obscured by overlying lung/ pleural disease. Worsening of left basilar airspace and interstitial opacity obscuring the heart borders in the left hemidiaphragm. Worsening of right upper airspace opacity, with persisting interstitial and airspace opacities of the right mid lung. IMPRESSION: Worsening mixed airspace and interstitial disease, more pronounced in the right upper lung and left lower lung, compatible with worsening infection and/ or edema. Signed, Yvone Neu. Loreta Ave, DO [facility] Electronically Signed   By: Gilmer Mor D.O.   On: 04/13/2015 09:28     STUDIES:  Echo 3/11 >EF 65-70%, GR1 DD , AV mod regurg, LA mod dilation, nml PAP, MV mod stenosis   CULTURES: 3/09 Influenza PCR >> Flu B positive 3/9 BC >>  ANTIBIOTICS: 3/08  Vancomycin >> 3/08 Azactam >> 3/09 Tamiflu >>   SIGNIFICANT EVENTS: 3/08 Admit 3/11 VDRF, to ICU  LINES/TUBES:  DISCUSSION: 65 yo female former smoker presented from NH with dyspnea.  Found to have influenza B.  Was in hospital in February 2017 with pulmonary infiltrates of unclear cause >> serology, bronchoscopy negative.  ASSESSMENT / PLAN:  PULMONARY A: Acute respiratory failure with hypoxia 2nd to Influenza B. P:   Full vent support F/u CXR in am  Scheduled BDs Continue solumedrol-wean as able (on paralytics)  Check ABG at 1400 today and in am  Cont Paralytics   CARDIOVASCULAR A:  Hx of Hypertrophic CM, mitral valve nodule. Echo 3/11 nml EF , gr 1 DD , MV mod stenosis  Troponin elevation  Hypotension /Septic Shock  P:  Continue toprol, ASA Cont on Hep Drip  Wean pressor for MAP goal >65  Trend LA   RENAL A:   Acute Renal Faliure  P:   Monitor renal fx, urine outpt  GASTROINTESTINAL A:   Morbid obesity. Hx of GERD. P:  Protonix Tube feeds while on vent  HEMATOLOGIC A:   Leukocytosis. P:  F/u CBC SQ heparin for DVT prophylaxis  INFECTIOUS A:   Pneumonia 2nd to influenza B. P:   Continue tamiflu, cefepime, vancomycin  ENDOCRINE A:   Steroid induced hyperglycemia. P:   SSI while on solumedrol  NEUROLOGIC A:   Acute encephalopathy 2nd to respiratory failure. P:   Cont sedation Cont paralytics  RASS goal -4 to -5  Krystine Pabst NP-C  [facility]  [phone]  04/14/2015

## 2015-04-14 NOTE — Progress Notes (Signed)
SUBJECTIVE:  Intubated and sedated  OBJECTIVE:   Vitals:   Filed Vitals:   04/14/15 1000 04/14/15 1015 04/14/15 1030 04/14/15 1045  BP: 108/43     Pulse: 67 66 68 68  Temp:      TempSrc:      Resp: 35 35 35 35  Height:      Weight:      SpO2: 98% 98% 98% 98%   I&O's:   Intake/Output Summary (Last 24 hours) at 04/14/15 1132 Last data filed at 04/14/15 1000  Gross per 24 hour  Intake 3297.68 ml  Output    990 ml  Net 2307.68 ml   TELEMETRY: Reviewed telemetry pt in NSR:     PHYSICAL EXAM General: intubated and sedated Lungs: clear anteriorly Heart:   HRRR S1 S2 Pulses are 2+ & equal. Abdomen: Bowel sounds are positive, abdomen soft and non-tender without masses Extremities:   No clubbing, cyanosis or edema.  DP +1  LABS: Basic Metabolic Panel:  Recent Labs  16/10/96 0502 04/13/15 1005 04/13/15 2049 04/14/15 0515  NA 142  --   --  139  K 4.1  --   --  4.2  CL 104  --   --  106  CO2 26  --   --  26  GLUCOSE 132*  --   --  161*  BUN 19  --   --  36*  CREATININE 0.66  --   --  1.51*  CALCIUM 9.1  --   --  8.7*  MG  --  2.2 2.3  --   PHOS  --  4.7* 6.4*  --    Liver Function Tests: No results for input(s): AST, ALT, ALKPHOS, BILITOT, PROT, ALBUMIN in the last 72 hours. No results for input(s): LIPASE, AMYLASE in the last 72 hours. CBC:  Recent Labs  04/13/15 0502 04/14/15 1049  WBC 22.6* 24.1*  HGB 12.4 10.7*  HCT 39.9 36.0  MCV 93.4 96.5  PLT 241 258   Cardiac Enzymes:  Recent Labs  04/11/15 1332 04/13/15 1006 04/13/15 2105  TROPONINI 0.25* 0.49* 1.26*   BNP: Invalid input(s): POCBNP D-Dimer: No results for input(s): DDIMER in the last 72 hours. Hemoglobin A1C: No results for input(s): HGBA1C in the last 72 hours. Fasting Lipid Panel:  Recent Labs  04/13/15 1006  TRIG 127   Thyroid Function Tests: No results for input(s): TSH, T4TOTAL, T3FREE, THYROIDAB in the last 72 hours.  Invalid input(s): FREET3 Anemia Panel: No  results for input(s): VITAMINB12, FOLATE, FERRITIN, TIBC, IRON, RETICCTPCT in the last 72 hours. Coag Panel:   No results found for: INR, PROTIME  RADIOLOGY: Dg Chest 2 View  04-24-15  CLINICAL DATA:  Shortness of breath and hypoxia. EXAM: CHEST  2 VIEW COMPARISON:  03/28/2015 FINDINGS: Shallow inspiration. Cardiac enlargement with increased vascular congestion. Bilateral parenchymal airspace disease, increasing since previous study. This may indicate edema or pneumonia. No blunting of costophrenic angles. No pneumothorax. 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 Degenerative changes in the spine and shoulders. IMPRESSION: Cardiac enlargement with pulmonary vascular congestion. Increasing bilateral airspace infiltrates in the lungs may indicate edema or pneumonia. Electronically Signed   By: Burman Nieves M.D.   On: April 24, 2015 23:05   Dg Chest 2 View  03/20/2015  CLINICAL DATA:  Shortness of breath with exertion. EXAM: CHEST  2 VIEW COMPARISON:  03/18/2015 and 03/15/2015 FINDINGS: Lungs are adequately  inflated with multifocal airspace process over the mid to lower lungs bilaterally left worse than right. Findings demonstrate overall slight worsening. Possible small amount left pleural fluid. Findings likely due to multifocal infection. Borderline stable cardiomegaly. Remainder of the exam is unchanged. IMPRESSION: Multifocal airspace process over the mid to lower lungs left greater than right with slight interval worsening likely representing multifocal infection. Possible small amount left pleural fluid. Stable borderline cardiomegaly. Electronically Signed   By: Elberta Fortis M.D.   On: 03/20/2015 08:07   Dg Chest 2 View  03/18/2015  CLINICAL DATA:  Shortness of breath and cough since Friday, former smoker EXAM: CHEST  2 VIEW COMPARISON:  03/15/2015 FINDINGS: Enlargement of cardiac  silhouette. Stable mediastinal contours. Diffuse BILATERAL airspace infiltrates, increased at RIGHT lower lobe versus previous study. Remaining lungs grossly unchanged. No definite pleural effusion or pneumothorax. Degenerative disc disease changes thoracic spine. IMPRESSION: Diffuse BILATERAL airspace infiltrates question pneumonia, increased in RIGHT lower lobe since previous exam. Electronically Signed   By: Ulyses Southward M.D.   On: 03/18/2015 08:10   Ct Chest Wo Contrast  03/22/2015  CLINICAL DATA:  65 year old female with extensive history of malignancy. Evaluate for underlying neoplasm. EXAM: CT CHEST WITHOUT CONTRAST TECHNIQUE: Multidetector CT imaging of the chest was performed following the standard protocol without IV contrast. COMPARISON:  Multiple recent prior chest x-rays most recently earlier today 5:28 a.m. FINDINGS: Mediastinum: Unremarkable CT appearance of the thyroid gland. No suspicious mediastinal or hilar adenopathy. No soft tissue mediastinal mass. The thoracic esophagus is unremarkable and conveys a nasogastric tube into the stomach. Heart/Vascular: Borderline cardiomegaly. Small pericardial effusion. Caseous calcification of the mitral valve annulus. Scattered atherosclerotic calcifications along the course of the coronary arteries. Lungs/Pleura: Multifocal regions of airspace consolidation and ground-glass attenuation opacity involving the dependent portions of the right upper and bilateral lower lobes as well as the periphery of these left greater than right upper lobes and the right middle lobe. Respiratory motion artifact is present. No definite pleural effusion. Bones/Soft Tissues: No acute fracture or aggressive appearing lytic or blastic osseous lesion. Multilevel degenerative disc disease throughout the thoracic spine. Upper Abdomen: Nasogastric tube coiled in the stomach. Otherwise, the imaged abdomen is unremarkable. IMPRESSION: 1. Extensive ground-glass attenuation airspace  opacity and airspace consolidation involving all lobes of both lungs but predominantly the dependent portions of the lower lobes. Differential considerations include multifocal pneumonia, aspiration, and less likely asymmetric or noncardiogenic pulmonary edema. Atypical processes including eosinophilic pneumonitis and organizing pneumonia are also considerations. 2. Borderline cardiomegaly. 3. Small pericardial effusion. 4. Coronary artery calcifications. Electronically Signed   By: Malachy Moan M.D.   On: 03/22/2015 16:14   Dg Chest Port 1 View  04/14/2015  CLINICAL DATA:  Respiratory failure EXAM: PORTABLE CHEST 1 VIEW COMPARISON:  04/13/2015 FINDINGS: Endotracheal tube terminates 2.5 cm above the carina. Cardiomegaly with suspected mild interstitial edema. Multifocal pneumonia is considered unlikely Small bilateral pleural effusions, right greater than left. No pneumothorax. Left subclavian venous catheter terminates at the cavoatrial junction. Enteric tube courses below the diaphragm. IMPRESSION: Cardiomegaly with suspected mild interstitial edema. Small bilateral pleural effusions, right greater than left. Endotracheal tube terminates 2.5 cm above the carina. Electronically Signed   By: Charline Bills M.D.   On: 04/14/2015 07:26   Dg Chest Port 1 View  04/13/2015  CLINICAL DATA:  Encounter for central line placement. EXAM: PORTABLE CHEST 1 VIEW COMPARISON:  Earlier this day at 0940 hour FINDINGS: Endotracheal tube 3.6 cm from the carina. Tip  of the enteric tube below the diaphragm in the stomach. There is a new left internal jugular central venous catheter with tip projecting over the mid SVC. No evidence of pneumothorax. Patient is significantly rotated to the left. Cardiomediastinal contours are grossly unchanged. Bilateral perihilar opacities, left greater than right, grossly unchanged. Suspect small pleural effusions. IMPRESSION: 1. Tip of the left central line in the mid SVC. No pneumothorax.  Endotracheal and enteric tubes remain in place. 2. Otherwise unchanged appearance of the lungs. Stable bilateral opacities, edema versus infection. Electronically Signed   By: Rubye Oaks M.D.   On: 04/13/2015 18:36   Dg Chest Port 1 View  04/13/2015  CLINICAL DATA:  Evaluate ETT EXAM: PORTABLE CHEST 1 VIEW COMPARISON:  April 13, 2015 FINDINGS: The ETT is in good position. The NG tube terminates in the stomach. No pneumothorax. Diffuse bilateral pulmonary opacities, left greater than right, are similar in the interval. IMPRESSION: Diffuse bilateral pulmonary opacities, left greater than right, similar in the interval. No other changes. Electronically Signed   By: Gerome Sam III M.D   On: 04/13/2015 10:23   Dg Chest Port 1 View  04/13/2015  CLINICAL DATA:  65 year old female with a history of respiratory failure. EXAM: PORTABLE CHEST 1 VIEW COMPARISON:  CT 03/22/2015, most recent plain film 05/01/2015, 03/28/2015, 03/26/2015 FINDINGS: Apical kyphotic positioning. Cardiomediastinal silhouette obscured by overlying lung/ pleural disease. Worsening of left basilar airspace and interstitial opacity obscuring the heart borders in the left hemidiaphragm. Worsening of right upper airspace opacity, with persisting interstitial and airspace opacities of the right mid lung. IMPRESSION: Worsening mixed airspace and interstitial disease, more pronounced in the right upper lung and left lower lung, compatible with worsening infection and/ or edema. Signed, Yvone Neu. Loreta Ave, DO [facility] Electronically Signed   By: Gilmer Mor D.O.   On: 04/13/2015 09:28   Dg Chest Port 1 View  03/28/2015  CLINICAL DATA:  Patient with respiratory failure. EXAM: PORTABLE CHEST 1 VIEW COMPARISON:  Chest radiograph 03/26/2015. FINDINGS: Interval removal central venous catheter. Stable cardiac and mediastinal contours. Low lung volumes. Improving bilateral mid and lower  lung airspace opacities. No definite pneumothorax. Possible small bilateral pleural effusions. IMPRESSION: Interval removal central venous catheter. Slight interval improvement bilateral airspace opacities which may represent improving edema or infection. Possible small bilateral pleural effusions. Electronically Signed   By: Annia Belt M.D.   On: 03/28/2015 11:23   Dg Chest Port 1 View  03/26/2015  CLINICAL DATA:  Respiratory failure. EXAM: PORTABLE CHEST 1 VIEW COMPARISON:  03/25/2015. FINDINGS: Left IJ line in stable position. Cardiomegaly with bilateral pulmonary infiltrates suggesting pulmonary edema. Bilateral pneumonia cannot be excluded. Infiltrates have progressed from prior exam. Small left pleural effusion. No pneumothorax. IMPRESSION: 1. Left IJ line in stable position. 2. Cardiomegaly with progressive bilateral pulmonary infiltrates suggesting pulmonary edema. Bilateral pneumonia cannot be excluded. Small left pleural effusion. Electronically Signed   By: Maisie Fus  Register   On: 03/26/2015 07:12   Dg Chest Port 1 View  03/25/2015  CLINICAL DATA:  65 year old female with acute respiratory failure. EXAM: PORTABLE CHEST 1 VIEW COMPARISON:  Chest radiograph dated 03/24/2015 FINDINGS: There has been interval removal of the endotracheal and enteric tube. A left IJ central venous line with tip overlying the central SVC again noted. There is stable cardiac silhouette. Bilateral lower lung field airspace opacities appear grossly stable or slightly improved compared to prior study. A small left pleural effusion may be present. No  acute osseous pathology. IMPRESSION: Interval removal of the endotracheal and enteric tube. Grossly stable or minimally improved bilateral lower lung field airspace opacities. Electronically Signed   By: Elgie Collard M.D.   On: 03/25/2015 07:22   Dg Chest Port 1 View  03/24/2015  CLINICAL DATA:  Acute respiratory failure. EXAM: PORTABLE CHEST 1 VIEW COMPARISON:  03/23/2015.  FINDINGS: Endotracheal tube in satisfactory position. Nasogastric tube extending into the stomach. Left jugular catheter tip in the superior vena cava. Stable enlarged cardiac silhouette and prominent pulmonary vasculature. Mildly decreased airspace opacity in both lower lung zones. No visible pleural fluid. Thoracic spine degenerative changes. IMPRESSION: 1. Mildly improved bibasilar pneumonia, aspiration pneumonitis or alveolar edema. 2. Stable cardiomegaly and pulmonary vascular congestion. Electronically Signed   By: Beckie Salts M.D.   On: 03/24/2015 07:51   Dg Chest Port 1 View  03/23/2015  CLINICAL DATA:  65 year old female currently intubated EXAM: PORTABLE CHEST 1 VIEW COMPARISON:  Prior chest x-ray and CT scan of the chest 03/22/2015 FINDINGS: The endotracheal tube is 2.4 cm above the carina. A nasogastric tube is present and coiled within the stomach. Left IJ central venous catheter the tip of which overlies the mid SVC. Stable cardiac and mediastinal contours. Bilateral patchy interstitial and airspace opacities most prominent in the lower lobes. No pneumothorax. No large pleural effusion. No acute osseous abnormality. IMPRESSION: 1. The tip the endotracheal tube is 2.4 cm above the carina. 2. Left IJ approach central venous catheter with the tip overlying the mid SVC. 3. Persistent diffuse bilateral interstitial and airspace opacities most confluent in the lower lobes concerning for multifocal pneumonia versus aspiration. Electronically Signed   By: Malachy Moan M.D.   On: 03/23/2015 07:37   Dg Chest Port 1 View  03/22/2015  CLINICAL DATA:  Intubated EXAM: PORTABLE CHEST 1 VIEW COMPARISON:  03/21/2015 FINDINGS: Cardiomegaly again noted. Stable endotracheal and NG tube position. Persistent patchy airspace disease/pneumonia bilaterally. Slight improvement in aeration right midlung. Question small left pleural effusion. Central mild vascular congestion without convincing pulmonary edema. Stable  left IJ central line with tip in SVC. IMPRESSION: Stable support apparatus. Persistent patchy airspace disease/pneumonia bilaterally. Slight improvement in aeration right midlung. Question small left pleural effusion. Central mild vascular congestion without convincing pulmonary edema. Electronically Signed   By: Natasha Mead M.D.   On: 03/22/2015 08:13   Portable Chest Xray  03/21/2015  CLINICAL DATA:  Acute on chronic respiratory failure, community-acquired pneumonia, acute CHF, known aortic and mitral stenosis EXAM: PORTABLE CHEST 1 VIEW COMPARISON:  Portable chest x-ray of March 20, 2015 FINDINGS: The lungs are well-expanded. The left hemidiaphragm is slightly better demonstrated today. Patchy confluent interstitial and alveolar opacities persist bilaterally. The cardiac silhouette is enlarged. The pulmonary vascularity is mildly prominent centrally but stable. The endotracheal tube tip lies 3 cm above the carina. The esophagogastric tube tip projects below the inferior margin of the image. The left internal jugular venous catheter tip projects over the midportion of the SVC. IMPRESSION: Persistent bilateral pneumonia. Superimposed low-grade CHF. Slight interval improvement since yesterday's study. The support tubes are in reasonable position. Electronically Signed   By: David  [place] M.D.   On: 03/21/2015 07:44   Dg Chest Port 1 View  03/20/2015  CLINICAL DATA:  Encounter for intubation, bronchoscopy and central line placement. EXAM: PORTABLE CHEST 1 VIEW COMPARISON:  03/20/2015 at 7:20 a.m. FINDINGS: Endotracheal tube tip projects 3.4 cm about the carina. Nasal/ orogastric tube passes below the diaphragm well into the stomach  below the included field of view. Left internal jugular central venous line tip projects in the lower superior vena cava. No pneumothorax. Bilateral lung opacities are without significant change from the earlier study allowing differences in positioning and technique. No new lung  abnormalities. IMPRESSION: 1. Endotracheal tube, nasal/orogastric tube and left internal jugular central venous line are well positioned. 2. No pneumothorax. Electronically Signed   By: Amie Portland M.D.   On: 03/20/2015 19:32    Assessment/Plan:  1. Acute Hypoxic Respiratory Failure Mixed cardiopulmonary etiology - BNP mildly elevated to 267 on admission. Good output with lasix . Weight was 226 lbs at time of discharge on 04/04/2015, down to 220 lbs at time of admission. Her weight has not been consistently recorded this admission. - CXR on admission showed increasing bilateral airspace infiltrates which may indicate edema or PNA. She has been started on Vancomycin and Aztreonam for possible PNA per the admitting team. She reported "significant improvement" in her breathing following her nebulizer treatment. Yesterday am developed acute respiratory failure. Refused BiPAP and ultimately intubated for acute hypoxemic hypercarbic resp failure. Cxray with diffuse infiltrative process most likely secondary to ARDS vs. Edema. Suspect this is more related to worsening PNA/Flu. Given IV Lasix. There is concern for inflammatory or interstitial lung disease. Her Sed rate had been elevated to 96 last admission, improved to 34 this admission.  - check BNP.  Chest xray this am with edema.  Would give lasix as BP tolerates.  - Need to be careful not to drop preload too much with diuretics given her significant HOCM.  2. Elevated Troponin - cyclic troponin values have been 0.33 and 0.35 (down-trending from 0.91 three weeks ago).  Trop bumped yesterday after acute respiratory event.  Most likely secondary to demand ischemia in setting of respiratory failure.   - EKG without acute changes. - had recent cardiac catheterization on 03/29/2015 which showed mild non-obstructive CAD. - No further ischemic evaluation indicated at this time with her recent cath results.   3. HOCM - severe focal basal hypertrophy of  the septum with moderate hypertrophy of the posterior wall was noted on recent echo last admission. EF was 75%.  - Beta blocker increased to maximize diastolic filling time   4. 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  No change.   5. Moderate AS on echo      Quintella Reichert, MD  04/14/2015  11:32 AM

## 2015-04-14 NOTE — Progress Notes (Signed)
ANTICOAGULATION CONSULT NOTE   Pharmacy Consult for Heparin Indication: chest pain/ACS  Allergies  Allergen Reactions  . Penicillins Hives and Itching    Has patient had a PCN reaction causing immediate rash, facial/tongue/throat swelling, SOB or lightheadedness with hypotension: Yes Has patient had a PCN reaction causing severe rash involving mucus membranes or skin necrosis: No Has patient had a PCN reaction that required hospitalization No Has patient had a PCN reaction occurring within the last 10 years: No If all of the above answers are "NO", then may proceed with Cephalosporin use.   . Sulfa Antibiotics Hives and Itching  . Ceclor [Cefaclor] Itching and Rash    Patient Measurements: Height: 5\' 3"  (160 cm) Weight: 228 lb 9.9 oz (103.7 kg) IBW/kg (Calculated) : 52.4 Heparin Dosing Weight: 76 kg  Vital Signs: Temp: 97.6 F (36.4 C) (03/12 1555) Temp Source: Oral (03/12 1555) BP: 101/43 mmHg (03/12 1600) Pulse Rate: 65 (03/12 1845)  Labs:  Recent Labs  04/12/15 0310 04/13/15 0502 04/13/15 1006 04/13/15 2105 04/14/15 0515 04/14/15 0527 04/14/15 1049 04/14/15 1831  HGB 12.9 12.4  --   --   --   --  10.7*  --   HCT 40.7 39.9  --   --   --   --  36.0  --   PLT 298 241  --   --   --   --  258  --   HEPARINUNFRC  --   --   --   --   --  0.44 0.24* 0.25*  CREATININE 0.94 0.66  --   --  1.51*  --   --   --   TROPONINI  --   --  0.49* 1.26*  --   --   --   --     Estimated Creatinine Clearance: 42.7 mL/min (by C-G formula based on Cr of 1.51).   Medical History: Past Medical History  Diagnosis Date  . CAP (community acquired pneumonia) 03/15/2015  . Family history of adverse reaction to anesthesia     "my mother got combative"  . Complication of anesthesia     "I hallucinated when I had teeth worked on"  . Sleep apnea     "need breath rite strip sometimes" (03/15/2015)  . History of blood transfusion     "w/tooth implant"  . GERD (gastroesophageal reflux  disease)   . Ocular migraine     "all the time" (03/15/2015)  . Arthritis     "all over; different joints" (03/15/2015)  . CAD (coronary artery disease)     a. mild non-obstructive CAD by cath in 03/2015.    Medications:  Prescriptions prior to admission  Medication Sig Dispense Refill Last Dose  . ALPRAZolam (XANAX) 0.5 MG tablet Take 1 tablet (0.5 mg total) by mouth 3 (three) times daily as needed for anxiety. 10 tablet 0 unknown  . alum & mag hydroxide-simeth (MAALOX/MYLANTA) 200-200-20 MG/5ML suspension Take 15 mLs by mouth every 4 (four) hours as needed for indigestion or heartburn. 355 mL 0 unknown at Unknown time  . aspirin 81 MG chewable tablet Chew 1 tablet (81 mg total) by mouth daily.   04/11/2015 at Unknown time  . metoprolol succinate (TOPROL-XL) 50 MG 24 hr tablet Take 50 mg by mouth 2 (two) times daily. Take with or immediately following a meal.   04/11/2015 at 1800  . pantoprazole (PROTONIX) 40 MG tablet Take 1 tablet (40 mg total) by mouth 2 (two) times daily before a meal.  04/11/2015 at Unknown time  . predniSONE (DELTASONE) 10 MG tablet Take 10 mg by mouth daily. Stop date 04/12/15   04/11/2015 at Unknown time  . metoprolol (LOPRESSOR) 50 MG tablet Take 1 tablet (50 mg total) by mouth 2 (two) times daily. (Patient not taking: Reported on 04/11/2015)   Not Taking at Unknown time  . predniSONE (DELTASONE) 10 MG tablet Take 10 mg by mouth daily. Stop date 04/20/15   Taking  . predniSONE (DELTASONE) 20 MG tablet Take 20 mg by mouth daily.   Taking    Assessment: 65 y.o. F known to pharmacy from abx dosing. Now with elevated trop. To begin heparin for r/o ACS. CBC stable. Heparin level still SUBtherapeutic (0.25), not planning on cath. 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  No bleeding or IV issues noted.  Goal of Therapy:  Heparin level 0.3-0.7 units/ml Monitor platelets by anticoagulation protocol: Yes   Plan:  Heparin gtt increased to 1350 units/hr Will f/u  heparin level in 6 hours Daily heparin level and CBC  Sheppard Coil PharmD., BCPS Clinical Pharmacist Pager [phone] 04/14/2015 7:39 PM

## 2015-04-15 ENCOUNTER — Inpatient Hospital Stay (HOSPITAL_COMMUNITY): Payer: Medicare HMO

## 2015-04-15 ENCOUNTER — Other Ambulatory Visit (HOSPITAL_COMMUNITY): Payer: Self-pay | Admitting: Respiratory Therapy

## 2015-04-15 DIAGNOSIS — J9600 Acute respiratory failure, unspecified whether with hypoxia or hypercapnia: Secondary | ICD-10-CM

## 2015-04-15 DIAGNOSIS — I7389 Other specified peripheral vascular diseases: Secondary | ICD-10-CM

## 2015-04-15 LAB — GLUCOSE, CAPILLARY
GLUCOSE-CAPILLARY: 118 mg/dL — AB (ref 65–99)
Glucose-Capillary: 108 mg/dL — ABNORMAL HIGH (ref 65–99)
Glucose-Capillary: 83 mg/dL (ref 65–99)
Glucose-Capillary: 76 mg/dL (ref 65–99)
Glucose-Capillary: 94 mg/dL (ref 65–99)

## 2015-04-15 LAB — CBC
HCT: 35.5 % — ABNORMAL LOW (ref 36.0–46.0)
Hemoglobin: 10.6 g/dL — ABNORMAL LOW (ref 12.0–15.0)
MCH: 28.2 pg (ref 26.0–34.0)
MCHC: 29.9 g/dL — AB (ref 30.0–36.0)
MCV: 94.4 fL (ref 78.0–100.0)
PLATELETS: 183 10*3/uL (ref 150–400)
RBC: 3.76 MIL/uL — AB (ref 3.87–5.11)
RDW: 16.6 % — ABNORMAL HIGH (ref 11.5–15.5)
WBC: 16 10*3/uL — ABNORMAL HIGH (ref 4.0–10.5)

## 2015-04-15 LAB — PHOSPHORUS
PHOSPHORUS: 8.2 mg/dL — AB (ref 2.5–4.6)
PHOSPHORUS: 7.8 mg/dL — AB (ref 2.5–4.6)

## 2015-04-15 LAB — BASIC METABOLIC PANEL
ANION GAP: 12 (ref 5–15)
Anion gap: 12 (ref 5–15)
Anion gap: 15 (ref 5–15)
BUN: 58 mg/dL — AB (ref 6–20)
BUN: 64 mg/dL — ABNORMAL HIGH (ref 6–20)
BUN: 71 mg/dL — AB (ref 6–20)
CALCIUM: 8.8 mg/dL — AB (ref 8.9–10.3)
CALCIUM: 8.6 mg/dL — AB (ref 8.9–10.3)
CALCIUM: 9 mg/dL (ref 8.9–10.3)
CHLORIDE: 105 mmol/L (ref 101–111)
CO2: 21 mmol/L — ABNORMAL LOW (ref 22–32)
CO2: 21 mmol/L — AB (ref 22–32)
CO2: 19 mmol/L — AB (ref 22–32)
CREATININE: 2.42 mg/dL — AB (ref 0.44–1.00)
CREATININE: 2.84 mg/dL — AB (ref 0.44–1.00)
Chloride: 104 mmol/L (ref 101–111)
Chloride: 106 mmol/L (ref 101–111)
Creatinine, Ser: 2.07 mg/dL — ABNORMAL HIGH (ref 0.44–1.00)
GFR calc Af Amer: 28 mL/min — ABNORMAL LOW (ref >60)
GFR calc non Af Amer: 20 mL/min — ABNORMAL LOW (ref >60)
GFR calc non Af Amer: 16 mL/min — ABNORMAL LOW (ref >60)
GFR, EST AFRICAN AMERICAN: 23 mL/min — AB (ref >60)
GFR, EST AFRICAN AMERICAN: 19 mL/min — AB (ref >60)
GFR, EST NON AFRICAN AMERICAN: 24 mL/min — AB (ref >60)
GLUCOSE: 124 mg/dL — AB (ref 65–99)
Glucose, Bld: 122 mg/dL — ABNORMAL HIGH (ref 65–99)
Glucose, Bld: 126 mg/dL — ABNORMAL HIGH (ref 65–99)
Potassium: 3.9 mmol/L (ref 3.5–5.1)
Potassium: 4.1 mmol/L (ref 3.5–5.1)
Potassium: 4.4 mmol/L (ref 3.5–5.1)
SODIUM: 139 mmol/L (ref 135–145)
SODIUM: 139 mmol/L (ref 135–145)
Sodium: 137 mmol/L (ref 135–145)

## 2015-04-15 LAB — MAGNESIUM
MAGNESIUM: 2.3 mg/dL (ref 1.7–2.4)
Magnesium: 2.2 mg/dL (ref 1.7–2.4)

## 2015-04-15 LAB — POCT I-STAT 3, ART BLOOD GAS (G3+)
ACID-BASE DEFICIT: 5 mmol/L — AB (ref 0.0–2.0)
ACID-BASE DEFICIT: 6 mmol/L — AB (ref 0.0–2.0)
BICARBONATE: 23.3 meq/L (ref 20.0–24.0)
BICARBONATE: 21.1 meq/L (ref 20.0–24.0)
O2 Saturation: 91 %
O2 Saturation: 93 %
PH ART: 7.263 — AB (ref 7.350–7.450)
PO2 ART: 69 mmHg — AB (ref 80.0–100.0)
TCO2: 25 mmol/L (ref 0–100)
TCO2: 23 mmol/L (ref 0–100)
pCO2 arterial: 54.2 mmHg — ABNORMAL HIGH (ref 35.0–45.0)
pCO2 arterial: 46.4 mmHg — ABNORMAL HIGH (ref 35.0–45.0)
pH, Arterial: 7.238 — ABNORMAL LOW (ref 7.350–7.450)
pO2, Arterial: 75 mmHg — ABNORMAL LOW (ref 80.0–100.0)

## 2015-04-15 LAB — HEPARIN LEVEL (UNFRACTIONATED): Heparin Unfractionated: 0.57 IU/mL (ref 0.30–0.70)

## 2015-04-15 LAB — TRIGLYCERIDES: TRIGLYCERIDES: 201 mg/dL — AB (ref <150)

## 2015-04-15 LAB — PROCALCITONIN: Procalcitonin: 4.13 ng/mL

## 2015-04-15 LAB — CMV CULTURE CMVC: CULTURE: NEGATIVE

## 2015-04-15 LAB — SODIUM, URINE, RANDOM

## 2015-04-15 LAB — APTT: APTT: 119 s — AB (ref 24–37)

## 2015-04-15 LAB — LACTIC ACID, PLASMA: LACTIC ACID, VENOUS: 0.9 mmol/L (ref 0.5–2.0)

## 2015-04-15 LAB — OSMOLALITY, URINE: Osmolality, Ur: 343 mOsm/kg (ref 300–900)

## 2015-04-15 MED ORDER — DEXTROSE-NACL 5-0.9 % IV SOLN
INTRAVENOUS | Status: DC
  Administered 2015-04-15 – 2015-04-16 (×3): via INTRAVENOUS

## 2015-04-15 MED ORDER — PROPOFOL 1000 MG/100ML IV EMUL
0.0000 ug/kg/min | INTRAVENOUS | Status: DC
  Administered 2015-04-15: 40 ug/kg/min via INTRAVENOUS
  Administered 2015-04-15: 28.924 ug/kg/min via INTRAVENOUS
  Administered 2015-04-15: 40 ug/kg/min via INTRAVENOUS
  Administered 2015-04-16 (×4): 30 ug/kg/min via INTRAVENOUS
  Filled 2015-04-15 (×7): qty 100

## 2015-04-15 MED ORDER — HEPARIN SODIUM (PORCINE) 5000 UNIT/ML IJ SOLN
5000.0000 [IU] | Freq: Three times a day (TID) | INTRAMUSCULAR | Status: DC
  Administered 2015-04-15 – 2015-04-19 (×12): 5000 [IU] via SUBCUTANEOUS
  Filled 2015-04-15 (×15): qty 1

## 2015-04-15 MED ORDER — FENTANYL CITRATE (PF) 100 MCG/2ML IJ SOLN: 50.0000 ug | Freq: Once | INTRAMUSCULAR | Status: DC

## 2015-04-15 MED ORDER — SODIUM CHLORIDE 0.9 % IV BOLUS (SEPSIS)
750.0000 mL | Freq: Once | INTRAVENOUS | Status: AC
  Administered 2015-04-15: 750 mL via INTRAVENOUS

## 2015-04-15 MED ORDER — METHYLPREDNISOLONE SODIUM SUCC 40 MG IJ SOLR
40.0000 mg | Freq: Three times a day (TID) | INTRAMUSCULAR | Status: DC
  Administered 2015-04-15 – 2015-04-16 (×3): 40 mg via INTRAVENOUS
  Filled 2015-04-15 (×4): qty 1

## 2015-04-15 MED ORDER — SODIUM CHLORIDE 0.9 % IV SOLN
25.0000 ug/h | INTRAVENOUS | Status: DC
  Administered 2015-04-15 – 2015-04-16 (×2): 100 ug/h via INTRAVENOUS
  Administered 2015-04-17 – 2015-04-18 (×4): 350 ug/h via INTRAVENOUS
  Administered 2015-04-18: 375 ug/h via INTRAVENOUS
  Administered 2015-04-19: 350 ug/h via INTRAVENOUS
  Filled 2015-04-15 (×9): qty 50

## 2015-04-15 MED ORDER — FENTANYL BOLUS VIA INFUSION
25.0000 ug | INTRAVENOUS | Status: DC | PRN
  Filled 2015-04-15: qty 25

## 2015-04-15 MED ORDER — DEXTROSE 5 % IV SOLN
1.0000 g | INTRAVENOUS | Status: AC
  Administered 2015-04-16 – 2015-04-19 (×4): 1 g via INTRAVENOUS
  Filled 2015-04-15 (×4): qty 1

## 2015-04-15 MED ORDER — METOCLOPRAMIDE HCL 5 MG/ML IJ SOLN
5.0000 mg | Freq: Two times a day (BID) | INTRAMUSCULAR | Status: DC
  Administered 2015-04-15 – 2015-04-16 (×4): 5 mg via INTRAVENOUS
  Filled 2015-04-15 (×7): qty 1

## 2015-04-15 NOTE — Progress Notes (Signed)
Patient Name: Rachel Baker Date of Encounter: 04/15/2015  Patient Profile: Ms. Rachel Baker is a 65 year old female with PMH of CAP (recently hospitalized from 03/15/15-04/04/15), GERD, migraines and arthritis. She was readmitted from SNF for hypoxic respiratory failure and flu positive eventually requiring intubation. Troponin positive and started on heparin drip.   She had a cardiac cath on 03/29/15 that showed no CAD.    SUBJECTIVE: Intubated, sedated.   OBJECTIVE Filed Vitals:   04/15/15 1100 04/15/15 1200 04/15/15 1232 04/15/15 1300  BP: 103/39 107/41 189/65 109/47  Pulse:   84   Temp:    97.3 F (36.3 C)  TempSrc:    Axillary  Resp: 35 35 35 29  Height:      Weight:      SpO2: 100% 100% 99%     Intake/Output Summary (Last 24 hours) at 04/15/15 1303 Last data filed at 04/15/15 1300  Gross per 24 hour  Intake 3660.97 ml  Output    199 ml  Net 3461.97 ml   Filed Weights   04/13/15 0858 04/14/15 0415 04/15/15 0400  Weight: 226 lb 3.1 oz (102.6 kg) 228 lb 9.9 oz (103.7 kg) 235 lb 0.2 oz (106.6 kg)    PHYSICAL EXAM General: Well developed, well nourished, female in no acute distress. Head: Normocephalic, atraumatic.  Neck: Supple without bruits, No JVD. Lungs:  Resp regular and unlabored, on vent, coarse rales in all fields.  Heart: RRR, S1, S2, no S3, S4, No murmur; no rub. Abdomen: Soft, non-tender, non-distended, BS + x 4.  Extremities: No clubbing, cyanosis,generalized edema.  Neuro: Sedated on ventilator  Psych: Unable to assess.  LABS: CBC: Recent Labs  04/14/15 1049 04/15/15 0355  WBC 24.1* 16.0*  HGB 10.7* 10.6*  HCT 36.0 35.5*  MCV 96.5 94.4  PLT 258 183   INBasic Metabolic Panel: Recent Labs  04/14/15 2035 04/15/15 0355 04/15/15 0800 04/15/15 1206  NA  --  137  --  139  K  --  3.9  --  4.1  CL  --  104  --  106  CO2  --  21*  --  21*  GLUCOSE  --  124*  --  122*  BUN  --  58*  --  64*  CREATININE  --  2.07*  --  2.42*  CALCIUM  --   8.8*  --  8.6*  MG 2.2  --  2.3  --   PHOS 6.5*  --  8.2*  --    Cardiac Enzymes: Recent Labs  04/13/15 1006 04/13/15 2105  TROPONINI 0.49* 1.26*   BNP:  B NATRIURETIC PEPTIDE  Date/Time Value Ref Range Status  04/14/2015 02:35 PM 735.2* 0.0 - 100.0 pg/mL Final  05-06-2015 11:15 PM 267.9* 0.0 - 100.0 pg/mL Final   TELE: NSR    ECG: NSR  Echo: - Left ventricle: The cavity size was normal. There was severe  focal basal and moderate concentric hypertrophy of the left  ventricle. Systolic function was vigorous. The estimated ejection  fraction was in the range of 65% to 70%. Wall motion was normal;  there were no regional wall motion abnormalities. Doppler  parameters are consistent with abnormal left ventricular  relaxation (grade 1 diastolic dysfunction). Doppler parameters  are consistent with elevated ventricular end-diastolic filling  pressure. - Aortic valve: There was moderate regurgitation. - Mitral valve: Severely calcified annulus. Moderately thickened,  moderately calcified leaflets . The findings are consistent with  moderate stenosis. There  was mild regurgitation. Mean gradient  (D): 8 mm Hg. Peak gradient (D): 15 mm Hg. Valve area by pressure  half-time: 1.83 cm^2. - Left atrium: The atrium was moderately dilated. - Right ventricle: The cavity size was normal. Wall thickness was  normal. Systolic function was normal. - Right atrium: The atrium was normal in size. - Tricuspid valve: There was mild regurgitation. - Pulmonary arteries: Systolic pressure was within the normal  range. - Inferior vena cava: The vessel was normal in size. - Pericardium, extracardiac: There was no pericardial effusion.  - There was severe focal basal and moderate concentric hypertrophy  of the left ventricle. LVOT gradient of 55 mmHg. LV hypofilled.   Thickened and calcified mitral valve with severely calcified  posterior annulus and associated moderate mitral  stenosis. Radiology/Studies: Dg Chest Port 1 View  04/15/2015  CLINICAL DATA:  Respiratory failure. EXAM: PORTABLE CHEST - 1 VIEW COMPARISON:  One-view chest x-ray 04/14/2015. FINDINGS: Endotracheal tube terminates 5 cm above the carina, in satisfactory position. A left IJ line is stable. The NG tube terminates in the stomach. Heart size is normal. Interstitial and airspace disease is not significantly changed. Bilateral pleural effusions are present. IMPRESSION: 1. Stable interstitial and airspace disease of bilateral pleural effusions. 2. The support apparatus is stable. Electronically Signed   By: Marin Roberts M.D.   On: 04/15/2015 07:42   Dg Chest Port 1 View  04/14/2015  CLINICAL DATA:  Respiratory failure EXAM: PORTABLE CHEST 1 VIEW COMPARISON:  04/13/2015 FINDINGS: Endotracheal tube terminates 2.5 cm above the carina. Cardiomegaly with suspected mild interstitial edema. Multifocal pneumonia is considered unlikely Small bilateral pleural effusions, right greater than left. No pneumothorax. Left subclavian venous catheter terminates at the cavoatrial junction. Enteric tube courses below the diaphragm. IMPRESSION: Cardiomegaly with suspected mild interstitial edema. Small bilateral pleural effusions, right greater than left. Endotracheal tube terminates 2.5 cm above the carina. Electronically Signed   By: Charline Bills M.D.   On: 04/14/2015 07:26   Dg Chest Port 1 View  04/13/2015  CLINICAL DATA:  Encounter for central line placement. EXAM: PORTABLE CHEST 1 VIEW COMPARISON:  Earlier this day at 0940 hour FINDINGS: Endotracheal tube 3.6 cm from the carina. Tip of the enteric tube below the diaphragm in the stomach. There is a new left internal jugular central venous catheter with tip projecting over the mid SVC. No evidence of pneumothorax. Patient is significantly rotated to the left. Cardiomediastinal contours are grossly unchanged. Bilateral perihilar opacities, left greater than right,  grossly unchanged. Suspect small pleural effusions. IMPRESSION: 1. Tip of the left central line in the mid SVC. No pneumothorax. Endotracheal and enteric tubes remain in place. 2. Otherwise unchanged appearance of the lungs. Stable bilateral opacities, edema versus infection. Electronically Signed   By: Rubye Oaks M.D.   On: 04/13/2015 18:36     Current Medications:  . albuterol  2.5 mg Nebulization Q6H  . antiseptic oral rinse  7 mL Mouth Rinse 10 times per day  . aspirin  81 mg Oral Daily  . budesonide (PULMICORT) nebulizer solution  0.5 mg Nebulization BID  . [START ON 04/16/2015] ceFEPime (MAXIPIME) IV  1 g Intravenous Q24H  . chlorhexidine gluconate  15 mL Mouth Rinse BID  . fentaNYL (SUBLIMAZE) injection  50 mcg Intravenous Once  . heparin subcutaneous  5,000 Units Subcutaneous 3 times per day  . insulin aspart  0-20 Units Subcutaneous 6 times per day  . methylPREDNISolone (SOLU-MEDROL) injection  40 mg Intravenous 3 times  per day  . metoCLOPramide (REGLAN) injection  5 mg Intravenous Q12H  . oseltamivir  30 mg Per Tube BID  . pantoprazole sodium  40 mg Per Tube Q24H   . sodium chloride 100 mL/hr at 04/15/15 1238  . fentaNYL infusion INTRAVENOUS    . norepinephrine (LEVOPHED) Adult infusion Stopped (04/15/15 0230)  . propofol (DIPRIVAN) infusion      ASSESSMENT AND PLAN: Principal Problem:   Acute respiratory failure with hypoxemia (HCC) Active Problems:   Acute diastolic heart failure (HCC)   Acrocyanosis (HCC)   HOCM (hypertrophic obstructive cardiomyopathy) (HCC)   Mitral valve mass   Acute respiratory failure (HCC)   Influenza B  1. Acute hypoxic respiratory failure - Mixed cardiopulmonary etiology - BNP is 735, CVP is 7.  Most likely needs preload in setting of acute illness and significant HOCM.  2. Elevated troponin - Troponin increased to 1.26 on 04/13/15, however this was following acute respiratory distress, she was intubated soon after.   - Most likely  demand ischemia in setting of respiratory failure. - Recent cath on 03/29/15 showed no CAD.   3. HOCM -severe focal basal hypertrophy of the septum with moderate hypertrophy of the posterior wall was noted on recent echo last admission. EF was 75%.  - Beta blocker increased to maximize diastolic filling time, however patient is requiring pressors.    4.  Mitral valve nodule - given acute respiratory decompensation with repeat echo showed normal LVF with moderate AR and moderate MS. No change.    Signed, Little Ishikawa , NP 1:03 PM 04/15/2015   I have seen and examined the patient along with Little Ishikawa , NP.  I have reviewed the chart, notes and new data.  I agree with NP's note.  Key new complaints: intubated, sedated Key examination changes: loud aortic ejection murmur and apical holosystolic murmur Key new findings / data: low CVP   PLAN:  She has HOCM and the use of positive inotropes should be strictly avoided. Treat hypotension with volume and vasoconstrictors (e.g. Phenylephrine). Avoid vasodilators (e.g. Nitrates, dihydropyridines, ACEi/ARB, hydralazine). Even relative vasodilatation from anetsthetics/paralytics may have some degree of deleterious effect. A relatively slow heart rate is beneficial for HOCM, but also for concomitant mitral stenosis. Tachycardia will result in rapid hemodynamic deterioration   Thurmon Fair, MD, [facility] [phone] 04/15/2015, 1:43 PM

## 2015-04-15 NOTE — Progress Notes (Signed)
ICU UR Completed. Vann Okerlund, RN, BSN.  [phone]  

## 2015-04-15 NOTE — Progress Notes (Signed)
ANTICOAGULATION CONSULT NOTE   Pharmacy Consult for Heparin Indication: chest pain/ACS  Allergies  Allergen Reactions  . Penicillins Hives and Itching    Has patient had a PCN reaction causing immediate rash, facial/tongue/throat swelling, SOB or lightheadedness with hypotension: Yes Has patient had a PCN reaction causing severe rash involving mucus membranes or skin necrosis: No Has patient had a PCN reaction that required hospitalization No Has patient had a PCN reaction occurring within the last 10 years: No If all of the above answers are "NO", then may proceed with Cephalosporin use.   . Sulfa Antibiotics Hives and Itching  . Ceclor [Cefaclor] Itching and Rash    Patient Measurements: Height: 5\' 3"  (160 cm) Weight: 228 lb 9.9 oz (103.7 kg) IBW/kg (Calculated) : 52.4 Heparin Dosing Weight: 76 kg  Vital Signs: Temp: 97.4 F (36.3 C) (03/13 0341) Temp Source: Oral (03/13 0341) BP: 130/44 mmHg (03/13 0300) Pulse Rate: 69 (03/13 0345)  Labs:  Recent Labs  04/13/15 0502 04/13/15 1006 04/13/15 2105 04/14/15 0515  04/14/15 1049 04/14/15 1831 04/15/15 0355  HGB 12.4  --   --   --   --  10.7*  --   --   HCT 39.9  --   --   --   --  36.0  --   --   PLT 241  --   --   --   --  258  --   --   HEPARINUNFRC  --   --   --   --   < > 0.24* 0.25* 0.57  CREATININE 0.66  --   --  1.51*  --   --   --   --   TROPONINI  --  0.49* 1.26*  --   --   --   --   --   < > = values in this interval not displayed.  Estimated Creatinine Clearance: 42.7 mL/min (by C-G formula based on Cr of 1.51).  Assessment: 65 y.o. F on heparin for r/o ACS. Elevated trop likely demand ischemia per cardiology. Heparin level therapeutic (0.57), not planning on cath. 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 No bleeding noted.  Goal of Therapy:  Heparin level 0.3-0.7 units/ml Monitor platelets by anticoagulation protocol: Yes   Plan:  Continue heparin at 1350 units/hr F/u planned  length of therapy with heparin  Mar 2017, PharmD, BCPS Clinical pharmacist, pager [phone] 04/15/2015 4:24 AM

## 2015-04-15 NOTE — Progress Notes (Signed)
PULMONARY / CRITICAL CARE MEDICINE   Name: Rachel Baker MRN: [id number] DOB: September 18, 1950    ADMISSION DATE:  04/23/15 CONSULTATION DATE:  04/11/2015  REFERRING MD:  [facility]  CHIEF COMPLAINT:  Short of breath  SUBJECTIVE:  Paralyzed remains  VITAL SIGNS: BP 103/39 mmHg  Pulse 70  Temp(Src) 97.3 F (36.3 C) (Axillary)  Resp 35  Ht 5\' 3"  (1.6 m)  Wt 106.6 kg (235 lb 0.2 oz)  BMI 41.64 kg/m2  SpO2 100%  VENTILATOR SETTINGS: Vent Mode:  [-] PRVC FiO2 (%):  [40 %-50 %] 40 % Set Rate:  [35 bmp] 35 bmp Vt Set:  [370 mL] 370 mL PEEP:  [5 cmH20-8 cmH20] 5 cmH20 Plateau Pressure:  [21 cmH20-27 cmH20] 21 cmH20  INTAKE / OUTPUT: I/O last 3 completed shifts: In: 5038.8 [I.V.:4053.8; NG/GT:585; IV Piggyback:400] Out: 570 [Urine:570]  PHYSICAL EXAMINATION: General: paralyzed Neuro:  RASS -4 with paralysis , prior HEENT:  ETT in place Cardiovascular:  Regular, 2/6 SM  Lungs:  B/l ronchi Abdomen:  Soft, non tender, wb noted , no r Musculoskeletal:  No edema Skin:  No rashes  LABS:  BMET  Recent Labs Lab 04/13/15 0502 04/14/15 0515 04/15/15 0355  NA 142 139 137  K 4.1 4.2 3.9  CL 104 106 104  CO2 26 26 21*  BUN 19 36* 58*  CREATININE 0.66 1.51* 2.07*  GLUCOSE 132* 161* 124*    Electrolytes  Recent Labs Lab 04/13/15 0502  04/14/15 0515 04/14/15 1049 04/14/15 2035 04/15/15 0355 04/15/15 0800  CALCIUM 9.1  --  8.7*  --   --  8.8*  --   MG  --   < >  --  2.3 2.2  --  2.3  PHOS  --   < >  --  5.9* 6.5*  --  8.2*  < > = values in this interval not displayed.  CBC  Recent Labs Lab 04/13/15 0502 04/14/15 1049 04/15/15 0355  WBC 22.6* 24.1* 16.0*  HGB 12.4 10.7* 10.6*  HCT 39.9 36.0 35.5*  PLT 241 258 183    Coag's  Recent Labs Lab 04/15/15 0355  APTT 119*    Sepsis Markers  Recent Labs Lab 04/11/15 0150 04/11/15 0233 04/11/15 0439 04/13/15 0502 04/15/15 0355  LATICACIDVEN 1.6  --  2.2*  --  0.9  PROCALCITON  --  <0.10  --  <0.10  4.13    ABG  Recent Labs Lab 04/14/15 0650 04/14/15 1433 04/15/15 0401  PHART 7.194* 7.275* 7.238*  PCO2ART 69.4* 52.7* 54.2*  PO2ART 120.0* 110.0* 69.0*    Liver Enzymes  Recent Labs Lab 04/11/15 0438  AST 37  ALT 21  ALKPHOS 63  BILITOT 2.7*  ALBUMIN 2.7*    Cardiac Enzymes  Recent Labs Lab 04/11/15 1332 04/13/15 1006 04/13/15 2105  TROPONINI 0.25* 0.49* 1.26*    Glucose  Recent Labs Lab 04/14/15 0843 04/14/15 1117 04/14/15 1552 04/14/15 1949 04/15/15 0025 04/15/15 0339  GLUCAP 138* 170* 131* 101* 118* 108*    Imaging Dg Chest Port 1 View  04/15/2015  CLINICAL DATA:  Respiratory failure. EXAM: PORTABLE CHEST - 1 VIEW COMPARISON:  One-view chest x-ray 04/14/2015. FINDINGS: Endotracheal tube terminates 5 cm above the carina, in satisfactory position. A left IJ line is stable. The NG tube terminates in the stomach. Heart size is normal. Interstitial and airspace disease is not significantly changed. Bilateral pleural effusions are present. IMPRESSION: 1. Stable interstitial and airspace disease of bilateral pleural effusions. 2. The support apparatus is  stable. Electronically Signed   By: Marin Roberts M.D.   On: 04/15/2015 07:42   Dg Chest Port 1 View  04/14/2015  CLINICAL DATA:  Respiratory failure EXAM: PORTABLE CHEST 1 VIEW COMPARISON:  04/13/2015 FINDINGS: Endotracheal tube terminates 2.5 cm above the carina. Cardiomegaly with suspected mild interstitial edema. Multifocal pneumonia is considered unlikely Small bilateral pleural effusions, right greater than left. No pneumothorax. Left subclavian venous catheter terminates at the cavoatrial junction. Enteric tube courses below the diaphragm. IMPRESSION: Cardiomegaly with suspected mild interstitial edema. Small bilateral pleural effusions, right greater than left. Endotracheal tube terminates 2.5 cm above the carina. Electronically Signed   By: Charline Bills M.D.   On: 04/14/2015 07:26   Dg Chest  Port 1 View  04/13/2015  CLINICAL DATA:  Encounter for central line placement. EXAM: PORTABLE CHEST 1 VIEW COMPARISON:  Earlier this day at 0940 hour FINDINGS: Endotracheal tube 3.6 cm from the carina. Tip of the enteric tube below the diaphragm in the stomach. There is a new left internal jugular central venous catheter with tip projecting over the mid SVC. No evidence of pneumothorax. Patient is significantly rotated to the left. Cardiomediastinal contours are grossly unchanged. Bilateral perihilar opacities, left greater than right, grossly unchanged. Suspect small pleural effusions. IMPRESSION: 1. Tip of the left central line in the mid SVC. No pneumothorax. Endotracheal and enteric tubes remain in place. 2. Otherwise unchanged appearance of the lungs. Stable bilateral opacities, edema versus infection. Electronically Signed   By: Rubye Oaks M.D.   On: 04/13/2015 18:36     STUDIES:  Echo 3/11 >EF 65-70%, GR1 DD , AV mod regurg, LA mod dilation, nml PAP, MV mod stenosis   CULTURES: 3/09 Influenza PCR >> Flu B positive 3/9 BC >>  ANTIBIOTICS: 3/08 Vancomycin >>>off 3/08 Azactam >>off 3/10 cefepime>>> 3/09 Tamiflu >>>  SIGNIFICANT EVENTS: 3/08 Admit 3/11 VDRF, paralysis 3/13- pos 3 liters  LINES/TUBES:  DISCUSSION: 65 yo female former smoker presented from NH with dyspnea.  Found to have influenza B.  Was in hospital in February 2017 with pulmonary infiltrates of unclear cause >> serology, bronchoscopy negative.  ASSESSMENT / PLAN:  PULMONARY A: Acute respiratory failure with hypoxia 2nd to Influenza B. ARDS pneumonitis P:   Full vent support F/u CXR in am  Scheduled BDs - dc f NOT home med as can worsen ARDS (NEJM 2013) Continue solumedrol, consider to q8h  Paralytics, dc Requires high rate, repeat abg and assess ph on current MV MV too high for SBT Ensure  Plat less 30  Even to allow pos balance Once off pressors x 24 hr then cvp goal 4,  FACTT  CARDIOVASCULAR A:  Hx of Hypertrophic CM, mitral valve nodule. Echo 3/11 nml EF , gr 1 DD , MV mod stenosis  Troponin elevation  Hypotension /Septic Shock - improved P:  Continue ASA Cont on Hep Drip  Wean pressor for MAP goal >60 tele cvp needed  RENAL A:   Acute Renal Faliure  P:   cvp 5, bolus as output poor bmet q8h Saline at 100 increase  GASTROINTESTINAL A:   Morbid obesity. Hx of GERD. High residuals P:   Protonix Tube feeds while on vent Post pyloric needed Add regaln  HEMATOLOGIC A:   Leukocytosis improved P:  F/u CBC Hep drip remain  INFECTIOUS A:   Pneumonia 2nd to influenza B P:   Continue tamiflu 7-10  Maintain cefepime, likely can  Narrow in am   ENDOCRINE A:   Steroid  induced hyperglycemia. P:   SSI while on solumedrol, reduce  NEUROLOGIC A:   Acute encephalopathy 2nd to respiratory failure. ARDS P:   Cont sedation Dc paralytics  RASS goal -3  Ccm time   Mcarthur Rossetti. Tyson Alias, MD, FACP Pgr: [phone] [facility]

## 2015-04-15 NOTE — Progress Notes (Signed)
Pharmacy Antibiotic Note  Rachel Baker is a 65 y.o. female admitted on 04/26/2015 with pneumonia.  Pharmacy has been consulted for antibiotic/cefepime dosing. Pt with acute renal failure requiring dosing adjustment  Plan: Adjusted Cefepime to 1 gram q 24 hour  Height: 5\' 3"  (160 cm) Weight: 235 lb 0.2 oz (106.6 kg) IBW/kg (Calculated) : 52.4  Temp (24hrs), Avg:97.4 F (36.3 C), Min:97.3 F (36.3 C), Max:97.6 F (36.4 C)   Recent Labs Lab 04/11/15 0150 04/11/15 0438 04/11/15 0439 04/12/15 0310 04/13/15 0502 04/14/15 0515 04/14/15 1049 04/15/15 0355 04/15/15 1206  WBC  --  15.3*  --  16.8* 22.6*  --  24.1* 16.0*  --   CREATININE  --  0.79  --  0.94 0.66 1.51*  --  2.07* 2.42*  LATICACIDVEN 1.6  --  2.2*  --   --   --   --  0.9  --     Estimated Creatinine Clearance: 27.1 mL/min (by C-G formula based on Cr of 2.42).    Allergies  Allergen Reactions  . Penicillins Hives and Itching    Has patient had a PCN reaction causing immediate rash, facial/tongue/throat swelling, SOB or lightheadedness with hypotension: Yes Has patient had a PCN reaction causing severe rash involving mucus membranes or skin necrosis: No Has patient had a PCN reaction that required hospitalization No Has patient had a PCN reaction occurring within the last 10 years: No If all of the above answers are "NO", then may proceed with Cephalosporin use.   . Sulfa Antibiotics Hives and Itching  . Ceclor [Cefaclor] Itching and Rash    Antimicrobials this admission: 3/9 Vanc >> 3/12 3/9 Aztreonam >>3/9  3/9 Tamiflu >> 3/10 cefepime >> 3/19 (3/14?)  Dose adjustments this admission: 3/13 Decreased cefepime to q 24 hr  Microbiology results: 3/9 BCx: ng x 4 days 3/11 Resp cx: pending  3/11 MRSA PCR: negative  Thank you for allowing 5/11 to participate in this patients care. [place], PharmD Pager: [phone]  04/15/2015 3:06 PM

## 2015-04-16 ENCOUNTER — Inpatient Hospital Stay (HOSPITAL_COMMUNITY): Payer: Medicare HMO

## 2015-04-16 DIAGNOSIS — J80 Acute respiratory distress syndrome: Secondary | ICD-10-CM | POA: Insufficient documentation

## 2015-04-16 DIAGNOSIS — I952 Hypotension due to drugs: Secondary | ICD-10-CM | POA: Insufficient documentation

## 2015-04-16 DIAGNOSIS — I509 Heart failure, unspecified: Secondary | ICD-10-CM

## 2015-04-16 LAB — URINE MICROSCOPIC-ADD ON

## 2015-04-16 LAB — BASIC METABOLIC PANEL
ANION GAP: 15 (ref 5–15)
Anion gap: 13 (ref 5–15)
BUN: 73 mg/dL — AB (ref 6–20)
BUN: 76 mg/dL — AB (ref 6–20)
CALCIUM: 8.8 mg/dL — AB (ref 8.9–10.3)
CHLORIDE: 107 mmol/L (ref 101–111)
CO2: 19 mmol/L — AB (ref 22–32)
CO2: 19 mmol/L — ABNORMAL LOW (ref 22–32)
Calcium: 8.9 mg/dL (ref 8.9–10.3)
Chloride: 105 mmol/L (ref 101–111)
Creatinine, Ser: 3.07 mg/dL — ABNORMAL HIGH (ref 0.44–1.00)
Creatinine, Ser: 3.37 mg/dL — ABNORMAL HIGH (ref 0.44–1.00)
GFR calc Af Amer: 17 mL/min — ABNORMAL LOW (ref >60)
GFR calc Af Amer: 15 mL/min — ABNORMAL LOW (ref >60)
GFR calc non Af Amer: 15 mL/min — ABNORMAL LOW (ref >60)
GFR calc non Af Amer: 13 mL/min — ABNORMAL LOW (ref >60)
GLUCOSE: 135 mg/dL — AB (ref 65–99)
GLUCOSE: 123 mg/dL — AB (ref 65–99)
POTASSIUM: 4.3 mmol/L (ref 3.5–5.1)
Potassium: 4.1 mmol/L (ref 3.5–5.1)
Sodium: 139 mmol/L (ref 135–145)
Sodium: 139 mmol/L (ref 135–145)

## 2015-04-16 LAB — BLOOD GAS, ARTERIAL
Acid-base deficit: 8.8 mmol/L — ABNORMAL HIGH (ref 0.0–2.0)
BICARBONATE: 18.1 meq/L — AB (ref 20.0–24.0)
Drawn by: 398661
FIO2: 0.6
LHR: 16 {breaths}/min
MECHVT: 550 mL
O2 SAT: 88.2 %
PATIENT TEMPERATURE: 98.6
PCO2 ART: 50.2 mmHg — AB (ref 35.0–45.0)
PEEP/CPAP: 5 cmH2O
PO2 ART: 61.7 mmHg — AB (ref 80.0–100.0)
TCO2: 19.7 mmol/L (ref 0–100)
pH, Arterial: 7.183 — CL (ref 7.350–7.450)

## 2015-04-16 LAB — GLUCOSE, CAPILLARY
GLUCOSE-CAPILLARY: 110 mg/dL — AB (ref 65–99)
GLUCOSE-CAPILLARY: 114 mg/dL — AB (ref 65–99)
GLUCOSE-CAPILLARY: 92 mg/dL (ref 65–99)
GLUCOSE-CAPILLARY: 97 mg/dL (ref 65–99)
Glucose-Capillary: 109 mg/dL — ABNORMAL HIGH (ref 65–99)
Glucose-Capillary: 103 mg/dL — ABNORMAL HIGH (ref 65–99)
Glucose-Capillary: 100 mg/dL — ABNORMAL HIGH (ref 65–99)

## 2015-04-16 LAB — C-REACTIVE PROTEIN: CRP: 6.4 mg/dL — AB (ref <1.0)

## 2015-04-16 LAB — POCT I-STAT 3, ART BLOOD GAS (G3+)
Acid-base deficit: 9 mmol/L — ABNORMAL HIGH (ref 0.0–2.0)
BICARBONATE: 19.4 meq/L — AB (ref 20.0–24.0)
O2 Saturation: 98 %
PCO2 ART: 50.9 mmHg — AB (ref 35.0–45.0)
TCO2: 21 mmol/L (ref 0–100)
pH, Arterial: 7.185 — CL (ref 7.350–7.450)
pO2, Arterial: 127 mmHg — ABNORMAL HIGH (ref 80.0–100.0)

## 2015-04-16 LAB — BODY FLUID CELL COUNT WITH DIFFERENTIAL
Eos, Fluid: 0 %
Lymphs, Fluid: 20 %
Monocyte-Macrophage-Serous Fluid: 5 % — ABNORMAL LOW (ref 50–90)
NEUTROPHIL FLUID: 75 % — AB (ref 0–25)
WBC FLUID: 1594 uL — AB (ref 0–1000)

## 2015-04-16 LAB — CBC WITH DIFFERENTIAL/PLATELET
Basophils Absolute: 0 10*3/uL (ref 0.0–0.1)
Basophils Relative: 0 %
EOS ABS: 0 10*3/uL (ref 0.0–0.7)
Eosinophils Relative: 0 %
HEMATOCRIT: 32.5 % — AB (ref 36.0–46.0)
HEMOGLOBIN: 9.8 g/dL — AB (ref 12.0–15.0)
LYMPHS ABS: 0.3 10*3/uL — AB (ref 0.7–4.0)
Lymphocytes Relative: 2 %
MCH: 28.2 pg (ref 26.0–34.0)
MCHC: 30.2 g/dL (ref 30.0–36.0)
MCV: 93.7 fL (ref 78.0–100.0)
MONO ABS: 0.3 10*3/uL (ref 0.1–1.0)
MONOS PCT: 2 %
NEUTROS PCT: 96 %
Neutro Abs: 13.2 10*3/uL — ABNORMAL HIGH (ref 1.7–7.7)
Platelets: 157 10*3/uL (ref 150–400)
RBC: 3.47 MIL/uL — ABNORMAL LOW (ref 3.87–5.11)
RDW: 17 % — AB (ref 11.5–15.5)
WBC: 13.8 10*3/uL — ABNORMAL HIGH (ref 4.0–10.5)

## 2015-04-16 LAB — URINALYSIS, ROUTINE W REFLEX MICROSCOPIC
Bilirubin Urine: NEGATIVE
GLUCOSE, UA: NEGATIVE mg/dL
Ketones, ur: NEGATIVE mg/dL
Nitrite: NEGATIVE
Protein, ur: 30 mg/dL — AB
Specific Gravity, Urine: 1.016 (ref 1.005–1.030)
pH: 5 (ref 5.0–8.0)

## 2015-04-16 LAB — CULTURE, BLOOD (ROUTINE X 2): CULTURE: NO GROWTH

## 2015-04-16 LAB — FUNGUS CULTURE W SMEAR: FUNGAL SMEAR: NONE SEEN

## 2015-04-16 LAB — CULTURE, RESPIRATORY W GRAM STAIN: Culture: NORMAL

## 2015-04-16 LAB — APTT: aPTT: 34 seconds (ref 24–37)

## 2015-04-16 LAB — GRAM STAIN

## 2015-04-16 LAB — CULTURE, RESPIRATORY

## 2015-04-16 LAB — TRIGLYCERIDES: TRIGLYCERIDES: 289 mg/dL — AB (ref <150)

## 2015-04-16 LAB — SEDIMENTATION RATE: SED RATE: 59 mm/h — AB (ref 0–22)

## 2015-04-16 MED ORDER — METOPROLOL TARTRATE 12.5 MG HALF TABLET
12.5000 mg | ORAL_TABLET | Freq: Two times a day (BID) | ORAL | Status: DC
  Administered 2015-04-16 – 2015-04-18 (×2): 12.5 mg via ORAL
  Filled 2015-04-16 (×9): qty 1

## 2015-04-16 MED ORDER — SODIUM CHLORIDE 0.9 % IV SOLN
0.0000 mg/h | INTRAVENOUS | Status: DC
  Administered 2015-04-17: 6 mg/h via INTRAVENOUS
  Administered 2015-04-17: 2 mg/h via INTRAVENOUS
  Administered 2015-04-17: 6 mg/h via INTRAVENOUS
  Administered 2015-04-18: 3 mg/h via INTRAVENOUS
  Administered 2015-04-18: 6 mg/h via INTRAVENOUS
  Filled 2015-04-16 (×6): qty 10

## 2015-04-16 MED ORDER — PHENYLEPHRINE HCL 10 MG/ML IJ SOLN
30.0000 ug/min | INTRAVENOUS | Status: DC
  Administered 2015-04-17: 30 ug/min via INTRAVENOUS
  Administered 2015-04-17: 40 ug/min via INTRAVENOUS
  Filled 2015-04-16 (×3): qty 1

## 2015-04-16 MED ORDER — MIDAZOLAM BOLUS VIA INFUSION
1.0000 mg | INTRAVENOUS | Status: DC | PRN
  Filled 2015-04-16: qty 2

## 2015-04-16 MED ORDER — FUROSEMIDE 10 MG/ML IJ SOLN
60.0000 mg | Freq: Two times a day (BID) | INTRAMUSCULAR | Status: DC
  Administered 2015-04-16 – 2015-04-17 (×2): 60 mg via INTRAVENOUS
  Filled 2015-04-16 (×3): qty 6

## 2015-04-16 MED ORDER — ETOMIDATE 2 MG/ML IV SOLN
20.0000 mg | Freq: Once | INTRAVENOUS | Status: AC
  Administered 2015-04-16: 20 mg via INTRAVENOUS

## 2015-04-16 MED ORDER — METHYLPREDNISOLONE SODIUM SUCC 125 MG IJ SOLR
80.0000 mg | Freq: Four times a day (QID) | INTRAMUSCULAR | Status: DC
  Administered 2015-04-16 – 2015-04-17 (×3): 80 mg via INTRAVENOUS
  Filled 2015-04-16 (×4): qty 1.28
  Filled 2015-04-16: qty 2

## 2015-04-16 MED ORDER — OSELTAMIVIR PHOSPHATE 6 MG/ML PO SUSR
30.0000 mg | Freq: Every day | ORAL | Status: DC
  Administered 2015-04-16: 30 mg
  Filled 2015-04-16 (×2): qty 5

## 2015-04-16 NOTE — Procedures (Signed)
Intubation Procedure Note- self extubated, desat on 100% diostress Rachel Baker [id number] [birth year]  Procedure: Intubation Indications: Respiratory insufficiency  Procedure Details Consent: Unable to obtain consent because of emergent medical necessity. Time Out: Verified patient identification, verified procedure, site/side was marked, verified correct patient position, special equipment/implants available, medications/allergies/relevent history reviewed, required imaging and test results available.  Performed  Maximum sterile technique was used including antiseptics, cap, gloves, gown, hand hygiene and mask.  MAC and 3    Evaluation Hemodynamic Status: BP stable throughout; O2 sats: stable throughout Patient's Current Condition: stable Complications: No apparent complications Patient did tolerate procedure well. Chest X-ray ordered to verify placement.  CXR: pending.   Nelda Bucks 04/16/2015

## 2015-04-16 NOTE — Progress Notes (Signed)
eLink Physician-Brief Progress Note Patient Name: Rachel Baker DOB: 18-Oct-1950 MRN: [id number]   Date of Service  04/16/2015  HPI/Events of Note  BAL gram stain = GPC's in pairs (rare). No organism ID or sensitivity available at this time. Patient is on Cefepime IV which should cover GPC's in pairs.   eICU Interventions  Continue present management.      Intervention Category Major Interventions: Infection - evaluation and management  Davarion Cuffee Eugene 04/16/2015, 8:19 PM

## 2015-04-16 NOTE — Progress Notes (Signed)
eLink Physician-Brief Progress Note Patient Name: Rachel Baker DOB: 02-05-1950 MRN: [id number]   Date of Service  04/16/2015  HPI/Events of Note  Patient dyssynchronous with ventilator. TV set at 380.   eICU Interventions  Will increase TV to 550 and decrease rate to 16. (Ppeak = 29). ABG at 9 PM.      Intervention Category Major Interventions: Respiratory failure - evaluation and management  Lenell Antu 04/16/2015, 7:57 PM

## 2015-04-16 NOTE — Progress Notes (Signed)
Patient self extubated and was placed on a NRB and then immediately reintubated. RT will continue to monitor.

## 2015-04-16 NOTE — Progress Notes (Signed)
[facility] PCCM PROGRESS NOTE  65 year old female with ARDS secondary to suspected flu pneumonitis. Self extubated and re-intubated today 3/14. Respiratory status on vent has continued to worsen over the course of the night. ABG tonight demonstrated respiratory and metabolic acidosis with hypoxia. Vent settings adjusted accordingly as respiratory rate was increased and PEEP was increased to 8. She continued to be dyssynchronous with vent, which could not be abated with ventilator setting manipulation. Sedation has been complicated by hypotension.   O: BP 112/48 mmHg  Pulse 77  Temp(Src) 97.4 F (36.3 C) (Oral)  Resp 14  Ht 5\' 3"  (1.6 m)  Wt 109.3 kg (240 lb 15.4 oz)  BMI 42.70 kg/m2  SpO2 97%  General:  Female, obese, appears air hungry on vent Neuro:  Agitated, unresponsive HEENT:  [place]/AT, PERRL, no JVD Cardiovascular:  RRR, no MRG Lungs: Coarse bilateral breath sounds Abdomen:  Soft, non-tender, non-distended Musculoskeletal:  No acute deformity Skin:  Intact, MMM  A/P: ARDS  Pneumonitis, likely flu, flu B pos.  Hypotension related to sedation  - Unable to obtain vent synchrony with ventilator changes - Will need to increase sedation per PAD protocol, switch propofol to versed in setting hypotension - Will order neo-synephrine if needed to maintain MAP > 19mm/HG if necessary - Repeat ABG in 1 hour, anticipating that she will hopefully have been synchronous for at least 30 mins.  Additional Critical care time 40 mins.  77m, ACNP [facility]/Critical Care Pager [phone] or [phone]

## 2015-04-16 NOTE — Progress Notes (Signed)
Vent alarming and on response patient was found with ETT out. Patient placed on a non-rebreather. Sats 70%. Dr Tyson Alias called to the bedside. Patient immediately re-intubated. New ETT 22 at the lip. Vent settings back to full support. Vitals stable. Obtaining STAT CXR to confirm tube placement. RN will continue to monitor.

## 2015-04-16 NOTE — Progress Notes (Signed)
Patient Profile: 65 year old female with PMH of CAP (recently hospitalized from 03/15/15-04/04/15), GERD, migraines and arthritis. Also with HOCM. She was readmitted from SNF for hypoxic respiratory failure and flu positive eventually requiring intubation. Troponin positive and started on heparin drip. She had a cardiac cath on 03/29/15 that showed no CAD.   Subjective: Intubated and sedated  Objective: Vital signs in last 24 hours: Temp:  [97.3 F (36.3 C)-97.7 F (36.5 C)] 97.3 F (36.3 C) (03/14 1158) Pulse Rate:  [41-102] 90 (03/14 1313) Resp:  [12-35] 30 (03/14 1313) BP: (97-173)/(35-74) 152/50 mmHg (03/14 1313) SpO2:  [91 %-100 %] 95 % (03/14 1313) Arterial Line BP: (95-188)/(33-91) 170/63 mmHg (03/14 1200) FiO2 (%):  [40 %-100 %] 70 % (03/14 1313) Weight:  [240 lb 15.4 oz (109.3 kg)] 240 lb 15.4 oz (109.3 kg) (03/14 0430) Last BM Date: 04/11/15  Intake/Output from previous day: 03/13 0701 - 03/14 0700 In: 3949.3 [I.V.:2949.3; NG/GT:190; IV Piggyback:800] Out: 121 [Urine:121] Intake/Output this shift: Total I/O In: 660.7 [I.V.:570.7; NG/GT:40; IV Piggyback:50] Out: 35 [Urine:35]  Medications Current Facility-Administered Medications  Medication Dose Route Frequency Provider Last Rate Last Dose  . 0.9 %  sodium chloride infusion   Intra-arterial PRN Coralyn Helling, MD      . acetaminophen (TYLENOL) solution 650 mg  650 mg Per Tube Q6H PRN Coralyn Helling, MD      . albuterol (PROVENTIL) (2.5 MG/3ML) 0.083% nebulizer solution 2.5 mg  2.5 mg Nebulization Q3H PRN Rahul P Desai, PA-C   2.5 mg at 04/11/15 1708  . albuterol (PROVENTIL) (2.5 MG/3ML) 0.083% nebulizer solution 2.5 mg  2.5 mg Nebulization Q6H Rodolph Bong, MD   2.5 mg at 04/16/15 1313  . antiseptic oral rinse solution (CORINZ)  7 mL Mouth Rinse 10 times per day Coralyn Helling, MD   7 mL at 04/16/15 1159  . aspirin chewable tablet 81 mg  81 mg Oral Daily Eduard Clos, MD   81 mg at 04/16/15 0951  . budesonide  (PULMICORT) nebulizer solution 0.5 mg  0.5 mg Nebulization BID Jose Alexis Frock, MD   0.5 mg at 04/16/15 0816  . ceFEPIme (MAXIPIME) 1 g in dextrose 5 % 50 mL IVPB  1 g Intravenous Q24H Nelda Bucks, MD   1 g at 04/16/15 0800  . chlorhexidine gluconate (PERIDEX) 0.12 % solution 15 mL  15 mL Mouth Rinse BID Coralyn Helling, MD   15 mL at 04/16/15 0807  . dextrose 5 %-0.9 % sodium chloride infusion   Intravenous Continuous Nelda Bucks, MD 10 mL/hr at 04/16/15 1334    . fentaNYL (SUBLIMAZE) 2,500 mcg in sodium chloride 0.9 % 250 mL (10 mcg/mL) infusion  25-400 mcg/hr Intravenous Continuous Nelda Bucks, MD 10 mL/hr at 04/16/15 0824 100 mcg/hr at 04/16/15 0824  . fentaNYL (SUBLIMAZE) bolus via infusion 25 mcg  25 mcg Intravenous Q1H PRN Nelda Bucks, MD      . fentaNYL (SUBLIMAZE) injection 50 mcg  50 mcg Intravenous Once Nelda Bucks, MD   50 mcg at 04/15/15 1145  . furosemide (LASIX) injection 60 mg  60 mg Intravenous Q12H Nelda Bucks, MD   60 mg at 04/16/15 1400  . heparin injection 5,000 Units  5,000 Units Subcutaneous 3 times per day Nelda Bucks, MD   5,000 Units at 04/16/15 1400  . insulin aspart (novoLOG) injection 0-20 Units  0-20 Units Subcutaneous 6 times per day Coralyn Helling, MD   3  Units at 04/14/15 1743  . methylPREDNISolone sodium succinate (SOLU-MEDROL) 125 mg/2 mL injection 80 mg  80 mg Intravenous Q6H Nelda Bucks, MD      . metoCLOPramide [facility]) injection 5 mg  5 mg Intravenous Q12H Coralyn Helling, MD   5 mg at 04/16/15 0951  . midazolam (VERSED) injection 1 mg  1 mg Intravenous Q2H PRN Coralyn Helling, MD   1 mg at 04/13/15 0856  . norepinephrine (LEVOPHED) 16 mg in dextrose 5 % 250 mL (0.064 mg/mL) infusion  0-40 mcg/min Intravenous Titrated Coralyn Helling, MD   Stopped at 04/15/15 0230  . ondansetron (ZOFRAN) injection 4 mg  4 mg Intravenous Q6H PRN Eduard Clos, MD      . oseltamivir (TAMIFLU) 6 MG/ML suspension 30 mg  30 mg Per Tube  Daily Nelda Bucks, MD   30 mg at 04/16/15 0951  . pantoprazole sodium (PROTONIX) 40 mg/20 mL oral suspension 40 mg  40 mg Per Tube Q24H Coralyn Helling, MD   40 mg at 04/16/15 0801  . propofol (DIPRIVAN) 1000 MG/100ML infusion  0-50 mcg/kg/min Intravenous Continuous Nelda Bucks, MD 19.2 mL/hr at 04/16/15 1021 30 mcg/kg/min at 04/16/15 1021    PE: General appearance: intubated and sedated Neck: no carotid bruit and no JVD Lungs: intubated Heart: NSR; loud aortic ejection murmur and apical holosystolic murmur Extremities: no edema, bilateral SCDs present Pulses: 2+ and symmetric Skin: warm and dry Neurologic: sedated  Lab Results:   Recent Labs  04/14/15 1049 04/15/15 0355 04/16/15 0345  WBC 24.1* 16.0* 13.8*  HGB 10.7* 10.6* 9.8*  HCT 36.0 35.5* 32.5*  PLT 258 183 157   BMET  Recent Labs  04/15/15 2008 04/16/15 0345 04/16/15 1205  NA 139 139 139  K 4.4 4.1 4.3  CL 105 105 107  CO2 19* 19* 19*  GLUCOSE 126* 135* 123*  BUN 71* 73* 76*  CREATININE 2.84* 3.07* 3.37*  CALCIUM 9.0 8.8* 8.9    Studies/Results: 2D Echo Study Conclusions  - Left ventricle: The cavity size was normal. There was severe  focal basal and moderate concentric hypertrophy of the left  ventricle. Systolic function was vigorous. The estimated ejection  fraction was in the range of 65% to 70%. Wall motion was normal;  there were no regional wall motion abnormalities. Doppler  parameters are consistent with abnormal left ventricular  relaxation (grade 1 diastolic dysfunction). Doppler parameters  are consistent with elevated ventricular end-diastolic filling  pressure. - Aortic valve: There was moderate regurgitation. - Mitral valve: Severely calcified annulus. Moderately thickened,  moderately calcified leaflets . The findings are consistent with  moderate stenosis. There was mild regurgitation. Mean gradient  (D): 8 mm Hg. Peak gradient (D): 15 mm Hg. Valve area by  pressure  half-time: 1.83 cm^2. - Left atrium: The atrium was moderately dilated. - Right ventricle: The cavity size was normal. Wall thickness was  normal. Systolic function was normal. - Right atrium: The atrium was normal in size. - Tricuspid valve: There was mild regurgitation. - Pulmonary arteries: Systolic pressure was within the normal  range. - Inferior vena cava: The vessel was normal in size. - Pericardium, extracardiac: There was no pericardial effusion.  Impressions:  - There was severe focal basal and moderate concentric hypertrophy  of the left ventricle. LVOT gradient of 55 mmHg. LV hypofilled.   Thickened and calcified mitral valve with severely calcified  posterior annulus and associated moderate mitral stenosis.   Assessment/Plan  Principal Problem:   Acute  respiratory failure with hypoxemia (HCC) Active Problems:   Acute diastolic heart failure (HCC)   Acrocyanosis (HCC)   HOCM (hypertrophic obstructive cardiomyopathy) (HCC)   Mitral valve mass   Acute respiratory failure (HCC)   Influenza B   1. Acute hypoxic respiratory failure - Mixed cardiopulmonary etiology -Intubated. Management per Pulmonary/CC  2. Elevated troponin - Troponin increased to 1.26 on 04/13/15, however this was following acute respiratory distress, she was intubated soon after.  - Most likely demand ischemia in setting of respiratory failure. - Recent cath on 03/29/15 showed no CAD.   3. HOCM -severe focal basal hypertrophy of the septum with moderate hypertrophy of the posterior wall was noted on recent echo last admission. EF was 75%.  - use of positive inotropes should be strictly avoided -Treat hypotension with volume and vasoconstrictors (e.g. Phenylephrine). - Avoid vasodilators (e.g. Nitrates, dihydropyridines, ACEi/ARB, hydralazine).   4. Mitral valve Stenosis - moderate by 2D echo. TEE ordered to better assess.    5. Acute Renal Failure: worsening. SCr  continues to rise 2.84>>3.07>>3.37. Per IM. ? Renal consult.    LOS: 5 days    Brittainy M. Delmer Islam 04/16/2015 2:03 PM    I have seen and examined the patient along with Brittainy M. Sharol Harness, PA-C.  I have reviewed the chart, notes and new data.  I agree with PA's note.  Key new complaints: intubated; self-extubated briefly, now back on vent Key examination changes: loud cardiac systolic murmur Key new findings / data: most worrisome development is worsening acute renal failure and metabolic acidosis (+minor metabolic acidosis) and oliguria  PLAN: Suspect ATN, after hypotension requiring pressors. Would like to restart beta blocker for HOCM, will use very low dose. If she becomes hypotensive again, prefer alpha agonist.  Thurmon Fair, MD, [facility] [phone] 04/16/2015, 6:30 PM

## 2015-04-16 NOTE — Progress Notes (Signed)
PULMONARY / CRITICAL CARE MEDICINE   Name: Rachel Baker MRN: [id number] DOB: 29-Oct-1950    ADMISSION DATE:  04/30/2015 CONSULTATION DATE:  04/11/2015  REFERRING MD:  [facility]  CHIEF COMPLAINT:  Short of breath  SUBJECTIVE:  Off paralysis, slef extubated re intubated in am   VITAL SIGNS: BP 151/74 mmHg  Pulse 102  Temp(Src) 97.3 F (36.3 C) (Oral)  Resp 15  Ht 5\' 3"  (1.6 m)  Wt 109.3 kg (240 lb 15.4 oz)  BMI 42.70 kg/m2  SpO2 95%  VENTILATOR SETTINGS: Vent Mode:  [-] PRVC FiO2 (%):  [40 %-100 %] 70 % Set Rate:  [24 bmp-35 bmp] 24 bmp Vt Set:  [370 mL] 370 mL PEEP:  [5 cmH20] 5 cmH20 Pressure Support:  [15 cmH20] 15 cmH20 Plateau Pressure:  [20 cmH20-23 cmH20] 23 cmH20  INTAKE / OUTPUT: I/O last 3 completed shifts: In: 5241.9 [I.V.:4171.9; Other:10; NG/GT:210; IV Piggyback:850] Out: 246 [Urine:246]  PHYSICAL EXAMINATION: General: awake, FC Neuro:  RASS 0, fc pre intubation HEENT:  ETT in place Cardiovascular:  s1 s2  Regular, 2/6 SM  Lungs: coarse Abdomen:  Soft, non tender, wb noted , no r Musculoskeletal:  No edema Skin:  No rashes  LABS:  BMET  Recent Labs Lab 04/15/15 2008 04/16/15 0345 04/16/15 1205  NA 139 139 139  K 4.4 4.1 4.3  CL 105 105 107  CO2 19* 19* 19*  BUN 71* 73* 76*  CREATININE 2.84* 3.07* 3.37*  GLUCOSE 126* 135* 123*    Electrolytes  Recent Labs Lab 04/14/15 2035  04/15/15 0800  04/15/15 2008 04/16/15 0345 04/16/15 1205  CALCIUM  --   < >  --   < > 9.0 8.8* 8.9  MG 2.2  --  2.3  --  2.2  --   --   PHOS 6.5*  --  8.2*  --  7.8*  --   --   < > = values in this interval not displayed.  CBC  Recent Labs Lab 04/14/15 1049 04/15/15 0355 04/16/15 0345  WBC 24.1* 16.0* 13.8*  HGB 10.7* 10.6* 9.8*  HCT 36.0 35.5* 32.5*  PLT 258 183 157    Coag's  Recent Labs Lab 04/15/15 0355 04/16/15 0345  APTT 119* 34    Sepsis Markers  Recent Labs Lab 04/11/15 0150 04/11/15 0233 04/11/15 0439 04/13/15 0502  04/15/15 0355  LATICACIDVEN 1.6  --  2.2*  --  0.9  PROCALCITON  --  <0.10  --  <0.10 4.13    ABG  Recent Labs Lab 04/15/15 0401 04/15/15 1238 04/16/15 1200  PHART 7.238* 7.263* 7.185*  PCO2ART 54.2* 46.4* 50.9*  PO2ART 69.0* 75.0* 127.0*    Liver Enzymes  Recent Labs Lab 04/11/15 0438  AST 37  ALT 21  ALKPHOS 63  BILITOT 2.7*  ALBUMIN 2.7*    Cardiac Enzymes  Recent Labs Lab 04/11/15 1332 04/13/15 1006 04/13/15 2105  TROPONINI 0.25* 0.49* 1.26*    Glucose  Recent Labs Lab 04/15/15 1600 04/15/15 2029 04/15/15 2343 04/16/15 0337 04/16/15 0813 04/16/15 1156  GLUCAP 76 94 110* 114* 97 103*    Imaging Dg Chest Port 1 View  04/16/2015  CLINICAL DATA:  Status post intubation today. EXAM: PORTABLE CHEST 1 VIEW COMPARISON:  Single view of the chest 04/15/2015. FINDINGS: Left subclavian catheter remains in place. The patient has an endotracheal tube with the tip 2.5 cm above the carina. Bilateral airspace and interstitial disease is again seen. Atelectasis in the right base is improved. Heart  size is enlarged. No pneumothorax. IMPRESSION: ETT tip is 2.5 cm above the carina in good position. Bilateral airspace interstitial opacities persist and could be due to infection and/or edema. Atelectasis the right base shows some improvement. Electronically Signed   By: Drusilla Kanner M.D.   On: 04/16/2015 10:40   Dg Chest Port 1 View  04/16/2015  CLINICAL DATA:  Hypoxia EXAM: PORTABLE CHEST 1 VIEW COMPARISON:  March 24, 2015 FINDINGS: Endotracheal tube tip is 1.8 cm above the carina. Central catheter tip is in the superior vena cava. Nasogastric tube tip and side port are in the stomach. No pneumothorax. There is moderate interstitial edema with probable alveolar edema in the right base region. Heart is mildly enlarged with evidence of pulmonary venous hypertension. No adenopathy evident. IMPRESSION: Findings consistent with a degree of congestive heart failure.  Superimposed pneumonia in the right base cannot be entirely excluded radiographically. Tube and catheter positions as described without pneumothorax. Electronically Signed   By: Bretta Bang III M.D.   On: 04/16/2015 08:01   Dg Chest Port 1 View  04/15/2015  CLINICAL DATA:  Respiratory failure. EXAM: PORTABLE CHEST - 1 VIEW COMPARISON:  One-view chest x-ray 04/14/2015. FINDINGS: Endotracheal tube terminates 5 cm above the carina, in satisfactory position. A left IJ line is stable. The NG tube terminates in the stomach. Heart size is normal. Interstitial and airspace disease is not significantly changed. Bilateral pleural effusions are present. IMPRESSION: 1. Stable interstitial and airspace disease of bilateral pleural effusions. 2. The support apparatus is stable. Electronically Signed   By: Marin Roberts M.D.   On: 04/15/2015 07:42     STUDIES:  Echo 3/11 >EF 65-70%, GR1 DD , AV mod regurg, LA mod dilation, nml PAP, MV mod stenosis   CULTURES: 3/09 Influenza PCR >> Flu B positive 3/9 BC >>  ANTIBIOTICS: 3/08 Vancomycin >>>off 3/08 Azactam >>off 3/10 cefepime>>> 3/09 Tamiflu >>>  SIGNIFICANT EVENTS: 3/08 Admit 3/11 VDRF, paralysis 3/13- pos 3 liters 3/14- self extubated, retubed rapid  LINES/TUBES:  DISCUSSION: 65 yo female former smoker presented from NH with dyspnea.  Found to have influenza B.  Was in hospital in February 2017 with pulmonary infiltrates of unclear cause >> serology, bronchoscopy negative.  ASSESSMENT / PLAN:  PULMONARY A: Acute respiratory failure with hypoxia 2nd to Influenza B. ARDS Pneumonitis, flu , other? R/o autoimmune in nature prior pneumonitis P:   re intubated rapid Repeat pcxr reviewed Assess autoimmune work up prior and ensure fully assessed Does she need repeat bronch? Open lung bx? pcxr stat abg reviewed, increase MV back to 24 Keep empiric steroids IV  RHEUM 2/12 ana - neg, anca neg, dsdna neg, RA 16  (slight elevated),  scl 70 neg 3/14-repeat testing  CARDIOVASCULAR A:  Hx of Hypertrophic CM, mitral valve nodule. Echo 3/11 nml EF , gr 1 DD , MV mod stenosis  Troponin elevation  Hypotension /Septic Shock - improved HOCM P:  Continue AS tele cvp 13, kvo Avoid any beta agonists  RENAL A:   Acute Renal Faliure worsening P:   Cvp11, despite almost 4 liters, worsening ATN Consider kvo Lasix UA for active sediment  GASTROINTESTINAL A:   Morbid obesity. Hx of GERD. High residuals P:   Protonix Tube feeds while on vent Post pyloric done reglan  HEMATOLOGIC A:   Leukocytosis improved P:  F/u CBC in am  Hep sub q  INFECTIOUS A:   Pneumonia 2nd to influenza B R/o pneumonitis R/o noninfectious PNA P:  Continue tamiflu 7-10  Consider empiric course -consider 8 days Consider bronch assessment for differential  ENDOCRINE A:   Steroid induced hyperglycemia. P:   Consider escalation, 80 q6h  NEUROLOGIC A:   Acute encephalopathy 2nd to respiratory failure. ARDS P:   Cont sedation, prop, fent, hold further WUA RASS goal -3  Ccm time   Mcarthur Rossetti. Tyson Alias, MD, FACP Pgr: [phone] [facility]

## 2015-04-16 NOTE — Procedures (Signed)
Bronchoscopy Procedure Note- r/o bacterial PNA on top flu Juna Caban [id number] Mar 21, 1950  Procedure: Bronchoscopy Indications: Obtain specimens for culture and/or other diagnostic studies  Procedure Details Consent: Risks of procedure as well as the alternatives and risks of each were explained to the (patient/caregiver).  Consent for procedure obtained. Time Out: Verified patient identification, verified procedure, site/side was marked, verified correct patient position, special equipment/implants available, medications/allergies/relevent history reviewed, required imaging and test results available.  Performed  In preparation for procedure, patient was given 100% FiO2 and bronchoscope lubricated. Sedation: Etomidate  Airway entered and the following bronchi were examined: RUL, RML, RLL, LUL, LLL and Bronchi.   Procedures performed: Brushings performed- no Bronchoscope removed.    Evaluation Hemodynamic Status: BP stable throughout; O2 sats: stable throughout Patient's Current Condition: stable Specimens:  Sent purulent fluid Complications: No apparent complications Patient did tolerate procedure well.   Nelda Bucks. 04/16/2015   1. Moderate pus Lingula, RML, BAL done 2. Diffuse erythema  3. Small ulceration carina 4,. No mass  Mcarthur Rossetti. Tyson Alias, MD, FACP Pgr: [phone] [facility]

## 2015-04-16 NOTE — Progress Notes (Signed)
eLink Physician-Brief Progress Note Patient Name: Rachel Baker DOB: 1951/01/22 MRN: [id number]   Date of Service  04/16/2015  HPI/Events of Note  ABG on 60%/PRVC 16/TV 550/P 5 = 7.18/50/61/18  eICU Interventions  Will order: 1. Increase rate to 26 and PEEP to 8. 2. ABG at 12 midnight.      Intervention Category Major Interventions: Acid-Base disturbance - evaluation and management;Respiratory failure - evaluation and management  Lenell Antu 04/16/2015, 10:43 PM

## 2015-04-17 ENCOUNTER — Inpatient Hospital Stay (HOSPITAL_COMMUNITY): Payer: Medicare HMO

## 2015-04-17 DIAGNOSIS — I5023 Acute on chronic systolic (congestive) heart failure: Secondary | ICD-10-CM

## 2015-04-17 DIAGNOSIS — I050 Rheumatic mitral stenosis: Secondary | ICD-10-CM

## 2015-04-17 DIAGNOSIS — I5033 Acute on chronic diastolic (congestive) heart failure: Secondary | ICD-10-CM

## 2015-04-17 LAB — COMPREHENSIVE METABOLIC PANEL
ALT: 18 U/L (ref 14–54)
AST: 23 U/L (ref 15–41)
Albumin: 2 g/dL — ABNORMAL LOW (ref 3.5–5.0)
Alkaline Phosphatase: 68 U/L (ref 38–126)
Anion gap: 14 (ref 5–15)
BUN: 87 mg/dL — AB (ref 6–20)
CALCIUM: 8.8 mg/dL — AB (ref 8.9–10.3)
CO2: 19 mmol/L — ABNORMAL LOW (ref 22–32)
CREATININE: 3.79 mg/dL — AB (ref 0.44–1.00)
Chloride: 104 mmol/L (ref 101–111)
GFR calc Af Amer: 13 mL/min — ABNORMAL LOW (ref >60)
GFR, EST NON AFRICAN AMERICAN: 12 mL/min — AB (ref >60)
Glucose, Bld: 107 mg/dL — ABNORMAL HIGH (ref 65–99)
POTASSIUM: 4.6 mmol/L (ref 3.5–5.1)
Sodium: 137 mmol/L (ref 135–145)
TOTAL PROTEIN: 5.1 g/dL — AB (ref 6.5–8.1)
Total Bilirubin: 0.6 mg/dL (ref 0.3–1.2)

## 2015-04-17 LAB — GLUCOSE, CAPILLARY
GLUCOSE-CAPILLARY: 88 mg/dL (ref 65–99)
GLUCOSE-CAPILLARY: 97 mg/dL (ref 65–99)
GLUCOSE-CAPILLARY: 81 mg/dL (ref 65–99)
Glucose-Capillary: 91 mg/dL (ref 65–99)
Glucose-Capillary: 86 mg/dL (ref 65–99)
Glucose-Capillary: 93 mg/dL (ref 65–99)

## 2015-04-17 LAB — ANCA TITERS
Atypical P-ANCA titer: <1:20 {titer}
P-ANCA: <1:20 {titer}

## 2015-04-17 LAB — POCT I-STAT 3, ART BLOOD GAS (G3+)
ACID-BASE DEFICIT: 10 mmol/L — AB (ref 0.0–2.0)
ACID-BASE DEFICIT: 9 mmol/L — AB (ref 0.0–2.0)
ACID-BASE DEFICIT: 9 mmol/L — AB (ref 0.0–2.0)
Bicarbonate: 17.9 mEq/L — ABNORMAL LOW (ref 20.0–24.0)
Bicarbonate: 18.3 mEq/L — ABNORMAL LOW (ref 20.0–24.0)
Bicarbonate: 18.8 mEq/L — ABNORMAL LOW (ref 20.0–24.0)
O2 SAT: 96 %
O2 SAT: 96 %
O2 Saturation: 95 %
PCO2 ART: 39.5 mmHg (ref 35.0–45.0)
PH ART: 7.23 — AB (ref 7.350–7.450)
PH ART: 7.263 — AB (ref 7.350–7.450)
PO2 ART: 99 mmHg (ref 80.0–100.0)
Patient temperature: 98.6
TCO2: 19 mmol/L (ref 0–100)
TCO2: 20 mmol/L (ref 0–100)
TCO2: 20 mmol/L (ref 0–100)
pCO2 arterial: 43.4 mmHg (ref 35.0–45.0)
pCO2 arterial: 51.8 mmHg — ABNORMAL HIGH (ref 35.0–45.0)
pH, Arterial: 7.164 — CL (ref 7.350–7.450)
pO2, Arterial: 86 mmHg (ref 80.0–100.0)
pO2, Arterial: 96 mmHg (ref 80.0–100.0)

## 2015-04-17 LAB — BLOOD GAS, ARTERIAL
ACID-BASE DEFICIT: 9.2 mmol/L — AB (ref 0.0–2.0)
ACID-BASE DEFICIT: 9.5 mmol/L — AB (ref 0.0–2.0)
BICARBONATE: 17.5 meq/L — AB (ref 20.0–24.0)
Bicarbonate: 17.4 mEq/L — ABNORMAL LOW (ref 20.0–24.0)
DRAWN BY: 398661
Drawn by: 39899
FIO2: 0.6
FIO2: 0.4
O2 SAT: 96.8 %
O2 SAT: 95 %
PCO2 ART: 47 mmHg — AB (ref 35.0–45.0)
PEEP/CPAP: 5 cmH2O
PEEP: 8 cmH2O
PH ART: 7.195 — AB (ref 7.350–7.450)
PH ART: 7.173 — AB (ref 7.350–7.450)
Patient temperature: 98.6
Patient temperature: 98.6
RATE: 35 resp/min
RATE: 35 resp/min
TCO2: 18.9 mmol/L (ref 0–100)
TCO2: 19 mmol/L (ref 0–100)
VT: 480 mL
VT: 390 mL
pCO2 arterial: 49.6 mmHg — ABNORMAL HIGH (ref 35.0–45.0)
pO2, Arterial: 101 mmHg — ABNORMAL HIGH (ref 80.0–100.0)
pO2, Arterial: 88.9 mmHg (ref 80.0–100.0)

## 2015-04-17 LAB — CBC WITH DIFFERENTIAL/PLATELET
BASOS ABS: 0 10*3/uL (ref 0.0–0.1)
BASOS PCT: 0 %
EOS ABS: 0 10*3/uL (ref 0.0–0.7)
EOS PCT: 0 %
HCT: 32 % — ABNORMAL LOW (ref 36.0–46.0)
Hemoglobin: 9.6 g/dL — ABNORMAL LOW (ref 12.0–15.0)
LYMPHS PCT: 3 %
Lymphs Abs: 0.4 10*3/uL — ABNORMAL LOW (ref 0.7–4.0)
MCH: 28.2 pg (ref 26.0–34.0)
MCHC: 30 g/dL (ref 30.0–36.0)
MCV: 93.8 fL (ref 78.0–100.0)
MONO ABS: 0.2 10*3/uL (ref 0.1–1.0)
Monocytes Relative: 1 %
Neutro Abs: 13.5 10*3/uL — ABNORMAL HIGH (ref 1.7–7.7)
Neutrophils Relative %: 96 %
PLATELETS: 183 10*3/uL (ref 150–400)
RBC: 3.41 MIL/uL — AB (ref 3.87–5.11)
RDW: 16.7 % — AB (ref 11.5–15.5)
WBC: 14 10*3/uL — AB (ref 4.0–10.5)

## 2015-04-17 LAB — MPO/PR-3 (ANCA) ANTIBODIES
ANCA Proteinase 3: <3.5 U/mL (ref 0.0–3.5)
Myeloperoxidase Abs: <9 U/mL (ref 0.0–9.0)

## 2015-04-17 LAB — BASIC METABOLIC PANEL
Anion gap: 14 (ref 5–15)
BUN: 98 mg/dL — AB (ref 6–20)
CALCIUM: 8.5 mg/dL — AB (ref 8.9–10.3)
CO2: 20 mmol/L — ABNORMAL LOW (ref 22–32)
Chloride: 105 mmol/L (ref 101–111)
Creatinine, Ser: 4.29 mg/dL — ABNORMAL HIGH (ref 0.44–1.00)
GFR calc Af Amer: 12 mL/min — ABNORMAL LOW (ref >60)
GFR, EST NON AFRICAN AMERICAN: 10 mL/min — AB (ref >60)
GLUCOSE: 115 mg/dL — AB (ref 65–99)
Potassium: 4.7 mmol/L (ref 3.5–5.1)
Sodium: 139 mmol/L (ref 135–145)

## 2015-04-17 LAB — PHOSPHORUS: Phosphorus: 10.4 mg/dL — ABNORMAL HIGH (ref 2.5–4.6)

## 2015-04-17 LAB — RHEUMATOID FACTOR: Rhuematoid fact SerPl-aCnc: 11.5 IU/mL (ref 0.0–13.9)

## 2015-04-17 LAB — C4 COMPLEMENT: COMPLEMENT C4, BODY FLUID: 27 mg/dL (ref 14–44)

## 2015-04-17 LAB — COMPLEMENT, TOTAL

## 2015-04-17 LAB — APTT: APTT: 33 s (ref 24–37)

## 2015-04-17 LAB — C3 COMPLEMENT: C3 COMPLEMENT: 125 mg/dL (ref 82–167)

## 2015-04-17 LAB — MAGNESIUM: Magnesium: 2.3 mg/dL (ref 1.7–2.4)

## 2015-04-17 MED ORDER — OSELTAMIVIR PHOSPHATE 6 MG/ML PO SUSR
30.0000 mg | Freq: Every day | ORAL | Status: DC
  Administered 2015-04-17 – 2015-04-18 (×2): 30 mg
  Filled 2015-04-17 (×3): qty 5

## 2015-04-17 MED ORDER — METHYLPREDNISOLONE SODIUM SUCC 125 MG IJ SOLR
60.0000 mg | Freq: Two times a day (BID) | INTRAMUSCULAR | Status: DC
  Administered 2015-04-17 – 2015-04-18 (×2): 60 mg via INTRAVENOUS
  Filled 2015-04-17 (×2): qty 0.96

## 2015-04-17 MED ORDER — VITAL HIGH PROTEIN PO LIQD
1000.0000 mL | ORAL | Status: DC
  Administered 2015-04-18 – 2015-04-19 (×2): 1000 mL

## 2015-04-17 MED ORDER — SODIUM BICARBONATE 8.4 % IV SOLN
INTRAVENOUS | Status: AC
  Filled 2015-04-17: qty 100

## 2015-04-17 MED ORDER — SODIUM BICARBONATE 8.4 % IV SOLN
INTRAVENOUS | Status: DC
  Administered 2015-04-17 – 2015-04-19 (×3): via INTRAVENOUS
  Filled 2015-04-17 (×5): qty 850

## 2015-04-17 MED ORDER — SODIUM BICARBONATE 8.4 % IV SOLN
100.0000 meq | Freq: Once | INTRAVENOUS | Status: AC
  Administered 2015-04-17: 100 meq via INTRAVENOUS
  Filled 2015-04-17: qty 100

## 2015-04-17 MED ORDER — FUROSEMIDE 10 MG/ML IJ SOLN
10.0000 mg/h | INTRAVENOUS | Status: DC
  Administered 2015-04-17: 10 mg/h via INTRAVENOUS
  Filled 2015-04-17 (×3): qty 25

## 2015-04-17 MED ORDER — VITAL HIGH PROTEIN PO LIQD
1000.0000 mL | ORAL | Status: DC
  Administered 2015-04-17: 1000 mL

## 2015-04-17 NOTE — Progress Notes (Signed)
Patient Name: Rachel Baker Date of Encounter: 04/17/2015  Principal Problem:   Acute respiratory failure with hypoxemia (HCC) Active Problems:   Acute diastolic heart failure (HCC)   Acrocyanosis (HCC)   HOCM (hypertrophic obstructive cardiomyopathy) (HCC)   Mitral valve mass   Acute respiratory failure (HCC)   Influenza B   Acute on chronic congestive heart failure (HCC)   ARDS (adult respiratory distress syndrome) (HCC)   Hypotension due to drugs   Length of Stay: 6  SUBJECTIVE  Remains sedated and intubated, difficult achieving synchronous ventilation and sedation limited by low BP. Renal function continues to deteriorate. Oliguric. Metabolic and respiratory acidosis.  CURRENT MEDS . albuterol  2.5 mg Nebulization Q6H  . antiseptic oral rinse  7 mL Mouth Rinse 10 times per day  . aspirin  81 mg Oral Daily  . budesonide (PULMICORT) nebulizer solution  0.5 mg Nebulization BID  . ceFEPime (MAXIPIME) IV  1 g Intravenous Q24H  . chlorhexidine gluconate  15 mL Mouth Rinse BID  . fentaNYL (SUBLIMAZE) injection  50 mcg Intravenous Once  . furosemide  60 mg Intravenous Q12H  . heparin subcutaneous  5,000 Units Subcutaneous 3 times per day  . insulin aspart  0-20 Units Subcutaneous 6 times per day  . methylPREDNISolone (SOLU-MEDROL) injection  80 mg Intravenous Q6H  . metoCLOPramide (REGLAN) injection  5 mg Intravenous Q12H  . metoprolol tartrate  12.5 mg Oral BID  . oseltamivir  30 mg Per Tube Daily  . pantoprazole sodium  40 mg Per Tube Q24H    OBJECTIVE   Intake/Output Summary (Last 24 hours) at 04/17/15 0925 Last data filed at 04/17/15 0604  Gross per 24 hour  Intake 1995.02 ml  Output    250 ml  Net 1745.02 ml   Filed Weights   04/15/15 0400 04/16/15 0430 04/17/15 0433  Weight: 106.6 kg (235 lb 0.2 oz) 109.3 kg (240 lb 15.4 oz) 109.5 kg (241 lb 6.5 oz)    PHYSICAL EXAM Filed Vitals:   04/17/15 0530 04/17/15 0545 04/17/15 0752 04/17/15 0856  BP: 99/46  99/44 126/40   Pulse: 70 70 64   Temp:    98 F (36.7 C)  TempSrc:    Oral  Resp: 35 35 35   Height:      Weight:      SpO2: 100% 100% 100%    General: intubated, sedated Head: no evidence of trauma, PERRL, EOMI, no exophtalmos or lid lag, no myxedema, no xanthelasma; normal ears, nose and oropharynx Neck:hard to see jugular venous pulsations ; brisk carotid pulses without delay and no carotid bruits Chest: rhonchi bilaterally Cardiovascular: unable to locat the apical impulse, regular rhythm, normal first and second heart sounds, no rubs or gallops, 2-3/6 systolic harsh murmur at base and apex murmur Abdomen: no tenderness or distention, no masses by palpation, no abnormal pulsatility or arterial bruits, normal bowel sounds, no hepatosplenomegaly Extremities: no clubbing, cyanosis or edema; 2+ radial, ulnar and brachial pulses bilaterally; 2+ right femoral, posterior tibial and dorsalis pedis pulses; 2+ left femoral, posterior tibial and dorsalis pedis pulses; no subclavian or femoral bruits Neurological: unable to perform  LABS  CBC  Recent Labs  04/16/15 0345 04/17/15 0439  WBC 13.8* 14.0*  NEUTROABS 13.2* 13.5*  HGB 9.8* 9.6*  HCT 32.5* 32.0*  MCV 93.7 93.8  PLT 157 183   Basic Metabolic Panel  Recent Labs  04/15/15 0800  04/15/15 2008  04/16/15 1205 04/17/15 0439  NA  --   < >  139  < > 139 137  K  --   < > 4.4  < > 4.3 4.6  CL  --   < > 105  < > 107 104  CO2  --   < > 19*  < > 19* 19*  GLUCOSE  --   < > 126*  < > 123* 107*  BUN  --   < > 71*  < > 76* 87*  CREATININE  --   < > 2.84*  < > 3.37* 3.79*  CALCIUM  --   < > 9.0  < > 8.9 8.8*  MG 2.3  --  2.2  --   --   --   PHOS 8.2*  --  7.8*  --   --   --   < > = values in this interval not displayed. Liver Function Tests  Recent Labs  04/17/15 0439  AST 23  ALT 18  ALKPHOS 68  BILITOT 0.6  PROT 5.1*  ALBUMIN 2.0*   Fasting Lipid Panel  Recent Labs  04/16/15 0819  TRIG 289*   Radiology  Studies Imaging results have been reviewed and Dg Chest Port 1 View  04/17/2015  CLINICAL DATA:  65 year old female with a history of pneumonia EXAM: PORTABLE CHEST 1 VIEW COMPARISON:  04/16/2015, 04/15/2015 FINDINGS: Cardiomediastinal silhouette likely unchanged, though there is significant left rotation limiting evaluation. Worsening mixed interstitial and airspace opacities of the bilateral lungs. No pneumothorax. Endotracheal tube unchanged, terminating 3.7 cm above the carina. Enteric tube in place terminating out of the field of view. Left IJ approach central catheter, appears to terminate at the superior cavoatrial junction. Overlying EKG leads. IMPRESSION: Slight worsening of interstitial and airspace disease, potentially combination of multifocal pneumonia, atelectasis, and/or edema. Unchanged endotracheal tube, terminating suitably above the carina. Unchanged left IJ approach central catheter and enteric tube. Signed, Yvone Neu. Loreta Ave, DO [facility] Radiology Electronically Signed   By: Gilmer Mor D.O.   On: 04/17/2015 07:34   Dg Chest Port 1 View  04/16/2015  CLINICAL DATA:  Status post intubation today. EXAM: PORTABLE CHEST 1 VIEW COMPARISON:  Single view of the chest 04/15/2015. FINDINGS: Left subclavian catheter remains in place. The patient has an endotracheal tube with the tip 2.5 cm above the carina. Bilateral airspace and interstitial disease is again seen. Atelectasis in the right base is improved. Heart size is enlarged. No pneumothorax. IMPRESSION: ETT tip is 2.5 cm above the carina in good position. Bilateral airspace interstitial opacities persist and could be due to infection and/or edema. Atelectasis the right base shows some improvement. Electronically Signed   By: Drusilla Kanner M.D.   On: 04/16/2015 10:40   Dg Chest Port 1 View  04/16/2015  CLINICAL DATA:  Hypoxia EXAM: PORTABLE CHEST 1 VIEW COMPARISON:  March 24, 2015  FINDINGS: Endotracheal tube tip is 1.8 cm above the carina. Central catheter tip is in the superior vena cava. Nasogastric tube tip and side port are in the stomach. No pneumothorax. There is moderate interstitial edema with probable alveolar edema in the right base region. Heart is mildly enlarged with evidence of pulmonary venous hypertension. No adenopathy evident. IMPRESSION: Findings consistent with a degree of congestive heart failure. Superimposed pneumonia in the right base cannot be entirely excluded radiographically. Tube and catheter positions as described without pneumothorax. Electronically Signed   By: Bretta Bang III M.D.   On: 04/16/2015 08:01   Dg Abd Portable 1v  04/16/2015  CLINICAL  DATA:  Encounter for feeding tube placement. EXAM: PORTABLE ABDOMEN - 1 VIEW COMPARISON:  None. FINDINGS: The bowel gas pattern is normal. Distal tip of feeding tube is seen in expected position of stomach. No radio-opaque calculi or other significant radiographic abnormality are seen. IMPRESSION: Distal tip of feeding tube is seen in stomach. Electronically Signed   By: Lupita Raider, M.D.   On: 04/16/2015 14:01    TELE NSR  ASSESSMENT AND PLAN  1. Acute hypoxic respiratory failure - Influenza B pneumonia superimposed on previous pulmonary inflammatory process of uncertain etiology - some degree of diastolic HF may be present (refer to right heart cath on previous admission) - Intubated. Management per Pulmonary/CC  2. Elevated troponin - Troponin increased to 1.26 on 04/13/15, however this was following acute respiratory distress, she was intubated soon after.  - Most likely demand ischemia in setting of respiratory failure. - Recent cath on 03/29/15 showed no CAD.   3. HOCM -severe focal basal hypertrophy of the septum with moderate hypertrophy of the posterior wall was noted on recent echo last admission. EF was 75%.  - use of positive inotropes should be avoided - Treat hypotension  with volume and vasoconstrictors (e.g. Phenylephrine). - Avoid vasodilators (e.g. Nitrates, dihydropyridines, ACEi/ARB, hydralazine).   4.Mitral valve Stenosis - moderate by 2D echo. TEE ordered to better assess.Avoid tachycardia  5. Oliguric Acute Renal Failure: worsening. SCr continues to rise 2.84>>3.07>>3.37.   Thurmon Fair, MD, [facility] [phone] office [phone] pager 04/17/2015 9:25 AM

## 2015-04-17 NOTE — Progress Notes (Signed)
eLink Physician-Brief Progress Note Patient Name: Rachel Baker DOB: 07-10-1950 MRN: [id number]   Date of Service  04/17/2015  HPI/Events of Note  abg 7.19/47/101 Vent setting RR 35, vt=480  eICU Interventions  Adjust vt to 500 Hold chest CT for now, reassess need in AM     Intervention Category Major Interventions: Acid-Base disturbance - evaluation and management  Albertus Chiarelli 04/17/2015, 4:52 AM

## 2015-04-17 NOTE — Progress Notes (Signed)
eLink Physician-Brief Progress Note Patient Name: Rachel Baker DOB: 1950/02/14 MRN: [id number]   Date of Service  04/17/2015  HPI/Events of Note  ABG 7.16/51.8/99  Vent settings PRVC RR 35, Vt 420, PEEP 8, Fio2 60%   eICU Interventions  Rate adjusted to 35 and vt to 480 Follow up abg at 8a     Intervention Category Major Interventions: Acid-Base disturbance - evaluation and management  [name] 04/17/2015, 4:15 AM

## 2015-04-17 NOTE — Progress Notes (Signed)
Nutrition Follow-up / Consult  DOCUMENTATION CODES:   Obesity unspecified  INTERVENTION:    Resume TF with Vital High Protein at 50 ml/h (1200 ml per day) to provide 1200 kcals, 105 gm protein, 1003 ml free water daily.  NUTRITION DIAGNOSIS:   Inadequate oral intake related to inability to eat as evidenced by NPO status.  Ongoing  GOAL:   Provide needs based on ASPEN/SCCM guidelines  Unmet  MONITOR:   TF tolerance, Vent status, Labs, I & O's  REASON FOR ASSESSMENT:   Consult Enteral/tube feeding initiation and management  ASSESSMENT:   65 y.o. female who was recently admitted last month for shortness of breath and at that time patient also was intubated for respiratory failure. She was eventually discharged to rehabilitation and was brought to the ER on 3/8  after patient acutely became short of breath. She required intubation and transfer to the ICU on 3/11.  Patient is currently intubated on ventilator support Temp (24hrs), Avg:97.7 F (36.5 C), Min:97.4 F (36.3 C), Max:98.1 F (36.7 C)  Propofol: none   Patient being treated for the flu. Self-extubated on 3/14 and had to be quickly re-intubated. TF off since Sunday. Noted residual of 310 ml. Unsure why TF was held. Cortrak tube was placed on 3/14, tip is in the stomach. TF resumed early this AM. Propofol has been d/c'ed. Received MD Consult for TF initiation and management.  Diet Order:   NPO  Skin:  Reviewed, no issues  Last BM:  3/9  Height:   Ht Readings from Last 1 Encounters:  04/05/15 5\' 3"  (1.6 m)    Weight:   Wt Readings from Last 1 Encounters:  04/17/15 241 lb 6.5 oz (109.5 kg)    Ideal Body Weight:  52.3 kg  BMI:  Body mass index is 42.77 kg/(m^2).  Estimated Nutritional Needs:   Kcal:  04/18/2015  Protein:  >/= 105 gm  Fluid:  >/= 1.5 L  EDUCATION NEEDS:   No education needs identified at this time  [id number], RD, LDN, CNSC Pager [phone] After Hours Pager  [phone]

## 2015-04-17 NOTE — Progress Notes (Signed)
CRITICAL VALUE ALERT  Critical value received: ABG pH 7.17  Date of notification:  04/17/15  Time of notification:  1830  Critical value read back:Yes.    Nurse who received alert:  Stefani Dama, RN  MD notified (1st page):  Dr Arsenio Loader  Time of first page:  1830  MD notified (2nd page):  Time of second page:  Responding MD:  Dr Arsenio Loader  Time MD responded:  [phone]

## 2015-04-17 NOTE — Progress Notes (Signed)
eLink Physician-Brief Progress Note Patient Name: Rachel Baker DOB: [birth year] MRN: [id number]   Date of Service  04/17/2015  HPI/Events of Note  ABG on 40%/PRVC 35/TV 390/P 5 = 7.17/49.6/88.9/17.5  eICU Interventions  Will order:  1. Increase TV to 450. 2. NaHCO3 IV infusion. 3. NaHCO3 100 meq IV now. 4. ABG at 10 PM.     Intervention Category Major Interventions: Acid-Base disturbance - evaluation and management;Respiratory failure - evaluation and management  Lenell Antu 04/17/2015, 6:45 PM

## 2015-04-17 NOTE — Progress Notes (Addendum)
PULMONARY / CRITICAL CARE MEDICINE   Name: Rachel Baker MRN: [id number] DOB: 04/07/1950    ADMISSION DATE:  2015-04-11 CONSULTATION DATE:  04/11/2015  REFERRING MD:  [facility]  CHIEF COMPLAINT:  Short of breath  SUBJECTIVE:  Worsening acidosis, shock, sedation needs higher  VITAL SIGNS: BP 126/40 mmHg  Pulse 64  Temp(Src) 98 F (36.7 C) (Oral)  Resp 35  Ht 5\' 3"  (1.6 m)  Wt 109.5 kg (241 lb 6.5 oz)  BMI 42.77 kg/m2  SpO2 100%  VENTILATOR SETTINGS: Vent Mode:  [-] PRVC FiO2 (%):  [50 %-100 %] 50 % Set Rate:  [16 bmp-35 bmp] 35 bmp Vt Set:  [370 mL-550 mL] 500 mL PEEP:  [5 cmH20-8 cmH20] 8 cmH20 Plateau Pressure:  [16 cmH20-44 cmH20] 37 cmH20  INTAKE / OUTPUT: I/O last 3 completed shifts: In: 3963.6 [I.V.:3303.6; NG/GT:610; IV Piggyback:50] Out: 330 [Urine:330]  PHYSICAL EXAMINATION: General: sedated Neuro:  RASS -4 HEENT:  ETT in place Cardiovascular:  s1 s2  Regular, 2/6 SM  Lungs: coarse ronchi Abdomen:  Soft, non tender, wb noted , no r Musculoskeletal:  No edema Skin:  No rashes  LABS:  BMET  Recent Labs Lab 04/16/15 0345 04/16/15 1205 04/17/15 0439  NA 139 139 137  K 4.1 4.3 4.6  CL 105 107 104  CO2 19* 19* 19*  BUN 73* 76* 87*  CREATININE 3.07* 3.37* 3.79*  GLUCOSE 135* 123* 107*    Electrolytes  Recent Labs Lab 04/14/15 2035  04/15/15 0800  04/15/15 2008 04/16/15 0345 04/16/15 1205 04/17/15 0439  CALCIUM  --   < >  --   < > 9.0 8.8* 8.9 8.8*  MG 2.2  --  2.3  --  2.2  --   --   --   PHOS 6.5*  --  8.2*  --  7.8*  --   --   --   < > = values in this interval not displayed.  CBC  Recent Labs Lab 04/15/15 0355 04/16/15 0345 04/17/15 0439  WBC 16.0* 13.8* 14.0*  HGB 10.6* 9.8* 9.6*  HCT 35.5* 32.5* 32.0*  PLT 183 157 183    Coag's  Recent Labs Lab 04/15/15 0355 04/16/15 0345 04/17/15 0439  APTT 119* 34 33    Sepsis Markers  Recent Labs Lab 04/11/15 0150 04/11/15 0233 04/11/15 0439 04/13/15 0502  04/15/15 0355  LATICACIDVEN 1.6  --  2.2*  --  0.9  PROCALCITON  --  <0.10  --  <0.10 4.13    ABG  Recent Labs Lab 04/17/15 0303 04/17/15 0430 04/17/15 0806  PHART 7.164* 7.195* 7.263*  PCO2ART 51.8* 47.0* 39.5  PO2ART 99.0 101* 96.0    Liver Enzymes  Recent Labs Lab 04/11/15 0438 04/17/15 0439  AST 37 23  ALT 21 18  ALKPHOS 63 68  BILITOT 2.7* 0.6  ALBUMIN 2.7* 2.0*    Cardiac Enzymes  Recent Labs Lab 04/11/15 1332 04/13/15 1006 04/13/15 2105  TROPONINI 0.25* 0.49* 1.26*    Glucose  Recent Labs Lab 04/16/15 0813 04/16/15 1156 04/16/15 1602 04/16/15 2039 04/16/15 2325 04/17/15 0352  GLUCAP 97 103* 92 100* 88 97    Imaging Dg Chest Port 1 View  04/17/2015  CLINICAL DATA:  65 year old female with a history of pneumonia EXAM: PORTABLE CHEST 1 VIEW COMPARISON:  04/16/2015, 04/15/2015 FINDINGS: Cardiomediastinal silhouette likely unchanged, though there is significant left rotation limiting evaluation. Worsening mixed interstitial and airspace opacities of the bilateral lungs. No pneumothorax. Endotracheal tube unchanged, terminating 3.7 cm  above the carina. Enteric tube in place terminating out of the field of view. Left IJ approach central catheter, appears to terminate at the superior cavoatrial junction. Overlying EKG leads. IMPRESSION: Slight worsening of interstitial and airspace disease, potentially combination of multifocal pneumonia, atelectasis, and/or edema. Unchanged endotracheal tube, terminating suitably above the carina. Unchanged left IJ approach central catheter and enteric tube. Signed, Yvone Neu. Loreta Ave, DO [facility] Radiology Electronically Signed   By: Gilmer Mor D.O.   On: 04/17/2015 07:34   Dg Chest Port 1 View  04/16/2015  CLINICAL DATA:  Status post intubation today. EXAM: PORTABLE CHEST 1 VIEW COMPARISON:  Single view of the chest 04/15/2015. FINDINGS: Left subclavian catheter remains  in place. The patient has an endotracheal tube with the tip 2.5 cm above the carina. Bilateral airspace and interstitial disease is again seen. Atelectasis in the right base is improved. Heart size is enlarged. No pneumothorax. IMPRESSION: ETT tip is 2.5 cm above the carina in good position. Bilateral airspace interstitial opacities persist and could be due to infection and/or edema. Atelectasis the right base shows some improvement. Electronically Signed   By: Drusilla Kanner M.D.   On: 04/16/2015 10:40   Dg Chest Port 1 View  04/16/2015  CLINICAL DATA:  Hypoxia EXAM: PORTABLE CHEST 1 VIEW COMPARISON:  March 24, 2015 FINDINGS: Endotracheal tube tip is 1.8 cm above the carina. Central catheter tip is in the superior vena cava. Nasogastric tube tip and side port are in the stomach. No pneumothorax. There is moderate interstitial edema with probable alveolar edema in the right base region. Heart is mildly enlarged with evidence of pulmonary venous hypertension. No adenopathy evident. IMPRESSION: Findings consistent with a degree of congestive heart failure. Superimposed pneumonia in the right base cannot be entirely excluded radiographically. Tube and catheter positions as described without pneumothorax. Electronically Signed   By: Bretta Bang III M.D.   On: 04/16/2015 08:01   Dg Abd Portable 1v  04/16/2015  CLINICAL DATA:  Encounter for feeding tube placement. EXAM: PORTABLE ABDOMEN - 1 VIEW COMPARISON:  None. FINDINGS: The bowel gas pattern is normal. Distal tip of feeding tube is seen in expected position of stomach. No radio-opaque calculi or other significant radiographic abnormality are seen. IMPRESSION: Distal tip of feeding tube is seen in stomach. Electronically Signed   By: Lupita Raider, M.D.   On: 04/16/2015 14:01     STUDIES:  Echo 3/11 >EF 65-70%, GR1 DD , AV mod regurg, LA mod dilation, nml PAP, MV mod stenosis   CULTURES: 3/09 Influenza PCR >> Flu B positive 3/9 BC >> [facility]  3/14  Culture>>>  HSV>>>  AFB>>>  PCP>>>  ANTIBIOTICS: 3/08 Vancomycin >>>off 3/08 Azactam >>off 3/10 cefepime>>> 3/09 Tamiflu >>>  SIGNIFICANT EVENTS: 3/08 Admit 3/11 VDRF, paralysis 3/13- pos 3 liters 3/14- self extubated, retubed rapid 3/15- declining status, worse infiltrates, worsening vent, pressors - sedation  LINES/TUBES: 3/11  ETT>>>self extubated 3/14 (retubed in 10 min)>>>  DISCUSSION: 65 yo female former smoker presented from NH with dyspnea.  Found to have influenza B.  Was in hospital in February 2017 with pulmonary infiltrates of unclear cause >> serology, bronchoscopy negative.  ASSESSMENT / PLAN:  PULMONARY A: Acute respiratory failure with hypoxia 2nd to Influenza B. ARDS Pneumonitis, flu , other superinfection? R/o autoimmune in nature prior pneumonitis, appearing gless likely P:   pcxr worsening, repeat in am, keep plat less 30 Keep empiric steroids IV, reduce with gram stain noted  ABg reviewed, max MV, likely can reduce peep to 5  If ph less 7.25, add bicarb Follow BAl closely NO SBT Ultimately would want open lung bx after treatment flu and possible bacterial process Abg in am, abg after below changes Plat noted this am, reduce TV by 1 cc/kg  RHEUM 2/12 ana - neg, anca neg, dsdna neg, RA 16  (slight elevated), scl 70 neg 3/14-repeat crp 6.4, RA 11.5, C3 125, c4 27,   CARDIOVASCULAR A:  Hx of Hypertrophic CM, mitral valve nodule. Echo 3/11 nml EF , gr 1 DD , MV mod stenosis  Troponin elevation  Hypotension /Septic Shock - improved HOCM P:  Continue AS tele cvp awaited Neo to MAp 60, sys 90 steroids Avoid any beta agonists  RENAL A:   Acute Renal Faliure worsening, ARDS P:   Cvp Consider kvo Lasix drip UA for active sediment -neg  GASTROINTESTINAL A:   Morbid obesity. Hx of GERD. High residuals P:   Protonix Tube feeds while on vent Follow BM Reglan, dc  HEMATOLOGIC A:   Leukocytosis improved P:  F/u CBC in am   Hep sub q  INFECTIOUS A:   Pneumonia 2nd to influenza B R/o pneumonitis R/o noninfectious PNA P:   Continue tamiflu and extend with worsening status Follow bronch bal, pairs noted? Reduce steroids Cefepime  Broaden if spike  ENDOCRINE A:   Steroid induced hyperglycemia. P:   ssi roids  NEUROLOGIC A:   Acute encephalopathy 2nd to respiratory failure. ARDS P:   Versed / fent required RASS goal -3  Sister updated Ccm time   Mcarthur Rossetti. Tyson Alias, MD, FACP Pgr: [phone] [place] [facility]    I have had extensive discussions with family sister, med poa. We discussed patients current circumstances and organ failures. We also discussed patient's prior wishes under circumstances such as this. Family has decided to NOT perform resuscitation if arrest but to continue current medical support for now. No trach option, re evalu in 72 hr.  Mcarthur Rossetti. Tyson Alias, MD, FACP Pgr: [phone] [place]

## 2015-04-17 NOTE — Progress Notes (Signed)
Abg obtained on patient and critical ph reported to MD who insisted to increase patient tidal volume to 450.  Per Dr. Tyson Alias this AM, was to decrease tidal volume to 7cc/kg to help with patient's increased plateau pressure.  Will continue to monitor.

## 2015-04-18 ENCOUNTER — Inpatient Hospital Stay (HOSPITAL_COMMUNITY): Payer: Medicare HMO

## 2015-04-18 DIAGNOSIS — J80 Acute respiratory distress syndrome: Secondary | ICD-10-CM

## 2015-04-18 LAB — CBC
HEMATOCRIT: 26.5 % — AB (ref 36.0–46.0)
Hemoglobin: 8.6 g/dL — ABNORMAL LOW (ref 12.0–15.0)
MCH: 29.5 pg (ref 26.0–34.0)
MCHC: 32.5 g/dL (ref 30.0–36.0)
MCV: 90.8 fL (ref 78.0–100.0)
PLATELETS: 129 10*3/uL — AB (ref 150–400)
RBC: 2.92 MIL/uL — ABNORMAL LOW (ref 3.87–5.11)
RDW: 16.7 % — AB (ref 11.5–15.5)
WBC: 6.8 10*3/uL (ref 4.0–10.5)

## 2015-04-18 LAB — MPO/PR-3 (ANCA) ANTIBODIES: ANCA Proteinase 3: <3.5 U/mL (ref 0.0–3.5)

## 2015-04-18 LAB — BASIC METABOLIC PANEL
ANION GAP: 17 — AB (ref 5–15)
Anion gap: 17 — ABNORMAL HIGH (ref 5–15)
BUN: 102 mg/dL — AB (ref 6–20)
BUN: 111 mg/dL — ABNORMAL HIGH (ref 6–20)
CHLORIDE: 102 mmol/L (ref 101–111)
CHLORIDE: 98 mmol/L — AB (ref 101–111)
CO2: 21 mmol/L — AB (ref 22–32)
CO2: 23 mmol/L (ref 22–32)
CREATININE: 4.56 mg/dL — AB (ref 0.44–1.00)
Calcium: 8.4 mg/dL — ABNORMAL LOW (ref 8.9–10.3)
Calcium: 8.2 mg/dL — ABNORMAL LOW (ref 8.9–10.3)
Creatinine, Ser: 4.72 mg/dL — ABNORMAL HIGH (ref 0.44–1.00)
GFR calc Af Amer: 11 mL/min — ABNORMAL LOW (ref >60)
GFR calc Af Amer: 10 mL/min — ABNORMAL LOW (ref >60)
GFR calc non Af Amer: 9 mL/min — ABNORMAL LOW (ref >60)
GFR calc non Af Amer: 9 mL/min — ABNORMAL LOW (ref >60)
GLUCOSE: 130 mg/dL — AB (ref 65–99)
Glucose, Bld: 99 mg/dL (ref 65–99)
POTASSIUM: 4.9 mmol/L (ref 3.5–5.1)
Potassium: 4.7 mmol/L (ref 3.5–5.1)
SODIUM: 140 mmol/L (ref 135–145)
Sodium: 138 mmol/L (ref 135–145)

## 2015-04-18 LAB — BLOOD GAS, ARTERIAL
Acid-base deficit: 4.3 mmol/L — ABNORMAL HIGH (ref 0.0–2.0)
Bicarbonate: 21.6 mEq/L (ref 20.0–24.0)
DRAWN BY: 39899
FIO2: 0.4
MECHVT: 390 mL
O2 SAT: 96 %
PATIENT TEMPERATURE: 98
PCO2 ART: 48.7 mmHg — AB (ref 35.0–45.0)
PEEP: 5 cmH2O
PO2 ART: 90.4 mmHg (ref 80.0–100.0)
RATE: 35 resp/min
TCO2: 23.1 mmol/L (ref 0–100)
pH, Arterial: 7.268 — ABNORMAL LOW (ref 7.350–7.450)

## 2015-04-18 LAB — POCT I-STAT 3, ART BLOOD GAS (G3+)
Acid-base deficit: 5 mmol/L — ABNORMAL HIGH (ref 0.0–2.0)
BICARBONATE: 21.7 meq/L (ref 20.0–24.0)
O2 Saturation: 92 %
PCO2 ART: 45.5 mmHg — AB (ref 35.0–45.0)
TCO2: 23 mmol/L (ref 0–100)
pH, Arterial: 7.283 — ABNORMAL LOW (ref 7.350–7.450)
pO2, Arterial: 70 mmHg — ABNORMAL LOW (ref 80.0–100.0)

## 2015-04-18 LAB — GLUCOSE, CAPILLARY
GLUCOSE-CAPILLARY: 70 mg/dL (ref 65–99)
GLUCOSE-CAPILLARY: 73 mg/dL (ref 65–99)
GLUCOSE-CAPILLARY: 76 mg/dL (ref 65–99)
GLUCOSE-CAPILLARY: 79 mg/dL (ref 65–99)
GLUCOSE-CAPILLARY: 94 mg/dL (ref 65–99)
GLUCOSE-CAPILLARY: 95 mg/dL (ref 65–99)
Glucose-Capillary: 109 mg/dL — ABNORMAL HIGH (ref 65–99)

## 2015-04-18 LAB — PNEUMOCYSTIS JIROVECI SMEAR BY DFA: Pneumocystis jiroveci Ag: NEGATIVE

## 2015-04-18 LAB — ACID FAST SMEAR (AFB, MYCOBACTERIA): Acid Fast Smear: NEGATIVE

## 2015-04-18 LAB — ACID FAST SMEAR (AFB)

## 2015-04-18 LAB — HERPES SIMPLEX VIRUS(HSV) DNA BY PCR
HSV 1 DNA: NEGATIVE
HSV 2 DNA: NEGATIVE

## 2015-04-18 LAB — ANTINUCLEAR ANTIBODIES, IFA: ANTINUCLEAR ANTIBODIES, IFA: NEGATIVE

## 2015-04-18 LAB — MAGNESIUM
Magnesium: 2.3 mg/dL (ref 1.7–2.4)
Magnesium: 2.5 mg/dL — ABNORMAL HIGH (ref 1.7–2.4)

## 2015-04-18 LAB — PHOSPHORUS
Phosphorus: 9.8 mg/dL — ABNORMAL HIGH (ref 2.5–4.6)
Phosphorus: 11.1 mg/dL — ABNORMAL HIGH (ref 2.5–4.6)

## 2015-04-18 LAB — APTT: aPTT: 36 seconds (ref 24–37)

## 2015-04-18 MED ORDER — ANTISEPTIC ORAL RINSE SOLUTION (CORINZ)
7.0000 mL | Freq: Four times a day (QID) | OROMUCOSAL | Status: DC
  Administered 2015-04-19 (×2): 7 mL via OROMUCOSAL

## 2015-04-18 MED ORDER — HYDROCORTISONE NA SUCCINATE PF 100 MG IJ SOLR
50.0000 mg | Freq: Four times a day (QID) | INTRAMUSCULAR | Status: DC
  Administered 2015-04-18 – 2015-04-19 (×4): 50 mg via INTRAVENOUS
  Filled 2015-04-18: qty 2
  Filled 2015-04-18 (×2): qty 1
  Filled 2015-04-18: qty 2
  Filled 2015-04-18 (×3): qty 1

## 2015-04-18 MED ORDER — FUROSEMIDE 10 MG/ML IJ SOLN
160.0000 mg | Freq: Two times a day (BID) | INTRAVENOUS | Status: DC
  Administered 2015-04-18 – 2015-04-19 (×3): 160 mg via INTRAVENOUS
  Filled 2015-04-18 (×4): qty 16

## 2015-04-18 MED ORDER — CHLORHEXIDINE GLUCONATE 0.12% ORAL RINSE (MEDLINE KIT)
15.0000 mL | Freq: Two times a day (BID) | OROMUCOSAL | Status: DC
  Administered 2015-04-19: 15 mL via OROMUCOSAL

## 2015-04-18 NOTE — Progress Notes (Signed)
PULMONARY / CRITICAL CARE MEDICINE   Name: Natira Lungren MRN: [id number] DOB: December 24, 1950    ADMISSION DATE:  2015-04-13 CONSULTATION DATE:  04/11/2015  REFERRING MD:  [facility]  CHIEF COMPLAINT:  Short of breath  SUBJECTIVE:  Off neo  VITAL SIGNS: BP 112/35 mmHg  Pulse 71  Temp(Src) 98 F (36.7 C) (Oral)  Resp 31  Ht 5\' 3"  (1.6 m)  Wt 112.7 kg (248 lb 7.3 oz)  BMI 44.02 kg/m2  SpO2 100%  VENTILATOR SETTINGS: Vent Mode:  [-] PRVC FiO2 (%):  [40 %-50 %] 40 % Set Rate:  [35 bmp] 35 bmp Vt Set:  [390 mL-450 mL] 450 mL PEEP:  [5 cmH20] 5 cmH20 Plateau Pressure:  [24 cmH20-31 cmH20] 25 cmH20  INTAKE / OUTPUT: I/O last 3 completed shifts: In: 3762.5 [I.V.:2983.8; NG/GT:728.7; IV Piggyback:50] Out: 530 [Urine:530]  PHYSICAL EXAMINATION: General: sedated Neuro:  RASS -4 HEENT:  ETT in place Cardiovascular:  s1 s2  Regular, 2/6 SM no changes in charachter Lungs: cta anterior Abdomen:  Soft, non tender, wb noted , no r Musculoskeletal:  No edema Skin:  No rashes  LABS:  BMET  Recent Labs Lab 04/16/15 1205 04/17/15 0439 04/17/15 2149  NA 139 137 139  K 4.3 4.6 4.7  CL 107 104 105  CO2 19* 19* 20*  BUN 76* 87* 98*  CREATININE 3.37* 3.79* 4.29*  GLUCOSE 123* 107* 115*    Electrolytes  Recent Labs Lab 04/15/15 0800  04/15/15 2008  04/16/15 1205 04/17/15 0439 04/17/15 2149  CALCIUM  --   < > 9.0  < > 8.9 8.8* 8.5*  MG 2.3  --  2.2  --   --   --  2.3  PHOS 8.2*  --  7.8*  --   --   --  10.4*  < > = values in this interval not displayed.  CBC  Recent Labs Lab 04/16/15 0345 04/17/15 0439 04/18/15 0500  WBC 13.8* 14.0* 6.8  HGB 9.8* 9.6* 8.6*  HCT 32.5* 32.0* 26.5*  PLT 157 183 129*    Coag's  Recent Labs Lab 04/16/15 0345 04/17/15 0439 04/18/15 0500  APTT 34 33 36    Sepsis Markers  Recent Labs Lab 04/13/15 0502 04/15/15 0355  LATICACIDVEN  --  0.9  PROCALCITON <0.10 4.13    ABG  Recent Labs Lab 04/17/15 0806  04/17/15 1820 04/17/15 2359  PHART 7.263* 7.173* 7.283*  PCO2ART 39.5 49.6* 45.5*  PO2ART 96.0 88.9 70.0*    Liver Enzymes  Recent Labs Lab 04/17/15 0439  AST 23  ALT 18  ALKPHOS 68  BILITOT 0.6  ALBUMIN 2.0*    Cardiac Enzymes  Recent Labs Lab 04/11/15 1332 04/13/15 1006 04/13/15 2105  TROPONINI 0.25* 0.49* 1.26*    Glucose  Recent Labs Lab 04/17/15 1605 04/17/15 2019 04/17/15 2331 04/18/15 0348 04/18/15 0556 04/18/15 0824  GLUCAP 86 93 79 70 76 73    Imaging Dg Chest Port 1 View  04/18/2015  CLINICAL DATA:  Adult respiratory distress syndrome. EXAM: PORTABLE CHEST 1 VIEW COMPARISON:  April 17, 2015. FINDINGS: Stable cardiomediastinal silhouette. Endotracheal and feeding tube are not significantly changed in position. Left internal jugular catheter is not significantly changed. No pneumothorax is noted. Stable bilateral interstitial lung densities are noted concerning for pneumonia or edema. Bony thorax is unremarkable. IMPRESSION: Stable support apparatus. Stable bilateral lung opacities are noted concerning for edema or pneumonia. Electronically Signed   By: Lupita Raider, M.D.   On: 04/18/2015 07:51  Dg Chest Port 1 View  04/17/2015  CLINICAL DATA:  65 year old female with a history of pneumonia EXAM: PORTABLE CHEST 1 VIEW COMPARISON:  04/16/2015, 04/15/2015 FINDINGS: Cardiomediastinal silhouette likely unchanged, though there is significant left rotation limiting evaluation. Worsening mixed interstitial and airspace opacities of the bilateral lungs. No pneumothorax. Endotracheal tube unchanged, terminating 3.7 cm above the carina. Enteric tube in place terminating out of the field of view. Left IJ approach central catheter, appears to terminate at the superior cavoatrial junction. Overlying EKG leads. IMPRESSION: Slight worsening of interstitial and airspace disease, potentially combination of multifocal pneumonia, atelectasis, and/or edema. Unchanged  endotracheal tube, terminating suitably above the carina. Unchanged left IJ approach central catheter and enteric tube. Signed, Yvone Neu. Loreta Ave, DO [facility] Electronically Signed   By: Gilmer Mor D.O.   On: 04/17/2015 07:34   Dg Chest Port 1 View  04/16/2015  CLINICAL DATA:  Status post intubation today. EXAM: PORTABLE CHEST 1 VIEW COMPARISON:  Single view of the chest 04/15/2015. FINDINGS: Left subclavian catheter remains in place. The patient has an endotracheal tube with the tip 2.5 cm above the carina. Bilateral airspace and interstitial disease is again seen. Atelectasis in the right base is improved. Heart size is enlarged. No pneumothorax. IMPRESSION: ETT tip is 2.5 cm above the carina in good position. Bilateral airspace interstitial opacities persist and could be due to infection and/or edema. Atelectasis the right base shows some improvement. Electronically Signed   By: Drusilla Kanner M.D.   On: 04/16/2015 10:40   Dg Abd Portable 1v  04/16/2015  CLINICAL DATA:  Encounter for feeding tube placement. EXAM: PORTABLE ABDOMEN - 1 VIEW COMPARISON:  None. FINDINGS: The bowel gas pattern is normal. Distal tip of feeding tube is seen in expected position of stomach. No radio-opaque calculi or other significant radiographic abnormality are seen. IMPRESSION: Distal tip of feeding tube is seen in stomach. Electronically Signed   By: Lupita Raider, M.D.   On: 04/16/2015 14:01     STUDIES:  Echo 3/11 >EF 65-70%, GR1 DD , AV mod regurg, LA mod dilation, nml PAP, MV mod stenosis   CULTURES: 3/09 Influenza PCR >> Flu B positive 3/9 BC >> Sputum 3/11>>>NF [facility] 3/14  Culture>>>  HSV>>>  AFB>>>neg  PCP>>>  ANTIBIOTICS: 3/08 Vancomycin >>>off 3/08 Azactam >>off 3/10 cefepime>>> 3/09 Tamiflu >>>10 days  SIGNIFICANT EVENTS: 3/08 Admit 3/11 VDRF, paralysis 3/13- pos 3 liters 3/14- self extubated, retubed rapid 3/15- declining  status, worse infiltrates, worsening vent, pressors - sedation  LINES/TUBES: 3/11  ETT>>>self extubated 3/14 (retubed in 10 min)>>>  DISCUSSION: 65 yo female former smoker presented from NH with dyspnea.  Found to have influenza B.  Was in hospital in February 2017 with pulmonary infiltrates of unclear cause >> serology, bronchoscopy negative.  ASSESSMENT / PLAN:  PULMONARY A: Acute respiratory failure with hypoxia 2nd to Influenza B. ARDS, permissive hypercapnia Pneumonitis, flu , other superinfection likely  R/o autoimmune in nature prior pneumonitis, appearing gless likely P:   Worsening infiltrates, repeat in am  ards protocol, keeping plat less 30  Repeat abg on rate 35 Back to 7 cc/kg, will  Tolerate PH as low as 7.15, plat 25 likely, repeat abg Keep bicarb for ards if ph less 7.25 Dc BDers Dc pulmicort cvp 4 in setting ards, see renal  RHEUM 2/12 ana - neg, anca neg, dsdna neg, RA 16  (slight elevated), scl 70 neg 3/14-repeat crp 6.4, RA 11.5, C3 125,  c4 27, ch50 >60 up unlikley autoimmune  CARDIOVASCULAR A:  Hx of Hypertrophic CM, mitral valve nodule. Echo 3/11 nml EF , gr 1 DD , MV mod stenosis  Troponin elevation  Hypotension /Septic Shock - improved HOCM P:  Continue AS tele cvp goal 4 in ards Neo off Steroids, change to stress roids as unlikely to be autoimmune Avoid any beta agonists  RENAL A:   Acute Renal Faliure worsening, ARDS Rising crt with pos 2 liters Family does NOT want HD P:   Cvp Consider kvo Lasix 160 bid Lasix drip dc as off pressors UA for active sediment -neg  GASTROINTESTINAL A:   Morbid obesity. Hx of GERD. High residuals P:   Protonix Tube feeds just restarted, assess BM in next 24-48 hr  HEMATOLOGIC A:   Leukocytosis improved P:  Hep sub q  INFECTIOUS A:   Pneumonia 2nd to influenza B with superinfection likely R/o pneumonitis (unlikley autoimmune, NOT steroid responsive eother) R/o noninfectious PNA P:    Continue tamiflu to stop date Cefepime to stop date  ENDOCRINE A:   Steroid induced hyperglycemia. P:   ssi roids to stress roids  NEUROLOGIC A:   Acute encephalopathy 2nd to respiratory failure. ARDS P:   Versed / fent required, goal to dc versed drip RASS goal -1 to -2  Ccm time 35 min   Mcarthur Rossetti. Tyson Alias, MD, FACP Pgr: [phone] [facility]

## 2015-04-18 NOTE — Progress Notes (Signed)
Patient Name: Rachel Baker Date of Encounter: 04/18/2015  Principal Problem:   Acute respiratory failure with hypoxemia (HCC) Active Problems:   Acute diastolic heart failure (HCC)   Acrocyanosis (HCC)   HOCM (hypertrophic obstructive cardiomyopathy) (HCC)   Mitral valve mass   Acute respiratory failure (HCC)   Influenza B   Acute on chronic congestive heart failure (HCC)   ARDS (adult respiratory distress syndrome) (HCC)   Hypotension due to drugs   Length of Stay: 7  SUBJECTIVE  Remains sedated, ventilator dependent. CXR worsening. Borderline BP, off vasopressors. Steadily worsening renal function. Marked oliguria.  Acidosis, on NaHCO3 drip. WBC much lower.  CURRENT MEDS . antiseptic oral rinse  7 mL Mouth Rinse 10 times per day  . aspirin  81 mg Oral Daily  . ceFEPime (MAXIPIME) IV  1 g Intravenous Q24H  . chlorhexidine gluconate  15 mL Mouth Rinse BID  . feeding supplement (VITAL HIGH PROTEIN)  1,000 mL Per Tube Q24H  . furosemide  160 mg Intravenous BID  . heparin subcutaneous  5,000 Units Subcutaneous 3 times per day  . hydrocortisone sod succinate (SOLU-CORTEF) inj  50 mg Intravenous Q6H  . insulin aspart  0-20 Units Subcutaneous 6 times per day  . metoprolol tartrate  12.5 mg Oral BID  . oseltamivir  30 mg Per Tube Daily  . pantoprazole sodium  40 mg Per Tube Q24H    OBJECTIVE   Intake/Output Summary (Last 24 hours) at 04/18/15 1349 Last data filed at 04/18/15 1200  Gross per 24 hour  Intake 2867.84 ml  Output    455 ml  Net 2412.84 ml   Filed Weights   04/16/15 0430 04/17/15 0433 04/18/15 0400  Weight: 109.3 kg (240 lb 15.4 oz) 109.5 kg (241 lb 6.5 oz) 112.7 kg (248 lb 7.3 oz)    PHYSICAL EXAM Filed Vitals:   04/18/15 1045 04/18/15 1100 04/18/15 1121 04/18/15 1217  BP: 101/46 105/50 146/50   Pulse: 67 66 68   Temp:    97.5 F (36.4 C)  TempSrc:    Oral  Resp: 31 31 35   Height:      Weight:      SpO2: 93% 90% 92%    General:  seated, intubated, calm Head: no evidence of trauma, PERRL, EOMI, no exophtalmos or lid lag, no myxedema, no xanthelasma; normal ears, nose and oropharynx Neck: normal jugular venous pulsations and no hepatojugular reflux; brisk carotid pulses without delay and no carotid bruits Chest: clear to auscultation, no signs of consolidation by percussion or palpation, normal fremitus, symmetrical and full respiratory excursions Cardiovascular: normal position and quality of the apical impulse, regular rhythm, normal first and second heart sounds, no rubs or gallops, 2-3/6 systolic ejection murmur, 2/6 apical late systolic murmur Abdomen: no tenderness or distention, no masses by palpation, no abnormal pulsatility or arterial bruits, normal bowel sounds, no hepatosplenomegaly Extremities: no clubbing, cyanosis; generalized edema; 2+ radial, ulnar and brachial pulses bilaterally; 2+ right femoral, posterior tibial and dorsalis pedis pulses; 2+ left femoral, posterior tibial and dorsalis pedis pulses; no subclavian or femoral bruits Neurological: unable to perfrorm  LABS  CBC  Recent Labs  04/16/15 0345 04/17/15 0439 04/18/15 0500  WBC 13.8* 14.0* 6.8  NEUTROABS 13.2* 13.5*  --   HGB 9.8* 9.6* 8.6*  HCT 32.5* 32.0* 26.5*  MCV 93.7 93.8 90.8  PLT 157 183 129*   Basic Metabolic Panel  Recent Labs  04/17/15 2149 04/18/15 0956  NA 139 140  K  4.7 4.7  CL 105 102  CO2 20* 21*  GLUCOSE 115* 99  BUN 98* 102*  CREATININE 4.29* 4.56*  CALCIUM 8.5* 8.4*  MG 2.3 2.3  PHOS 10.4* 9.8*   Liver Function Tests  Recent Labs  04/17/15 0439  AST 23  ALT 18  ALKPHOS 68  BILITOT 0.6  PROT 5.1*  ALBUMIN 2.0*  Fasting Lipid Panel  Recent Labs  04/16/15 0819  TRIG 289*   Radiology Studies Imaging results have been reviewed and Dg Chest Port 1 View  04/18/2015  CLINICAL DATA:  Adult respiratory distress syndrome. EXAM: PORTABLE CHEST 1 VIEW COMPARISON:  April 17, 2015. FINDINGS: Stable  cardiomediastinal silhouette. Endotracheal and feeding tube are not significantly changed in position. Left internal jugular catheter is not significantly changed. No pneumothorax is noted. Stable bilateral interstitial lung densities are noted concerning for pneumonia or edema. Bony thorax is unremarkable. IMPRESSION: Stable support apparatus. Stable bilateral lung opacities are noted concerning for edema or pneumonia. Electronically Signed   By: Lupita Raider, M.D.   On: 04/18/2015 07:51   Dg Chest Port 1 View  04/17/2015  CLINICAL DATA:  65 year old female with a history of pneumonia EXAM: PORTABLE CHEST 1 VIEW COMPARISON:  04/16/2015, 04/15/2015 FINDINGS: Cardiomediastinal silhouette likely unchanged, though there is significant left rotation limiting evaluation. Worsening mixed interstitial and airspace opacities of the bilateral lungs. No pneumothorax. Endotracheal tube unchanged, terminating 3.7 cm above the carina. Enteric tube in place terminating out of the field of view. Left IJ approach central catheter, appears to terminate at the superior cavoatrial junction. Overlying EKG leads. IMPRESSION: Slight worsening of interstitial and airspace disease, potentially combination of multifocal pneumonia, atelectasis, and/or edema. Unchanged endotracheal tube, terminating suitably above the carina. Unchanged left IJ approach central catheter and enteric tube. Signed, Yvone Neu. Loreta Ave, DO [facility] Radiology Electronically Signed   By: Gilmer Mor D.O.   On: 04/17/2015 07:34   Dg Abd Portable 1v  04/16/2015  CLINICAL DATA:  Encounter for feeding tube placement. EXAM: PORTABLE ABDOMEN - 1 VIEW COMPARISON:  None. FINDINGS: The bowel gas pattern is normal. Distal tip of feeding tube is seen in expected position of stomach. No radio-opaque calculi or other significant radiographic abnormality are seen. IMPRESSION: Distal tip of feeding tube is seen in stomach.  Electronically Signed   By: Lupita Raider, M.D.   On: 04/16/2015 14:01    TELE NSR   ASSESSMENT AND PLAN  1. Acute hypoxic respiratory failure/ARDS - Influenza B pneumonia superimposed on previous pulmonary inflammatory process of uncertain etiology - some degree of diastolic HF may be present (refer to right heart cath on previous admission). Hard to distinguish relative contribution of CHF. Clearly hypervolemic. - Intubated. Management per Pulmonary/CC  2. Elevated troponin - Most likely demand ischemia in setting of respiratory failure. - Recent cath on 03/29/15 showed no CAD.   3. HOCM - severe focal basal hypertrophy of the septum with moderate hypertrophy of the posterior wall was noted on recent echo last admission. EF was 75%.  - use of positive inotropes should be avoided - Treat hypotension with volume and vasoconstrictors (e.g. Phenylephrine). - Avoid vasodilators (e.g. Nitrates, dihydropyridines, ACEi/ARB, hydralazine).  - If BP higher, add more beta blocker  4.Mitral valve Stenosis - moderate by 2D echo.Avoid tachycardia. 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  5. Oliguric Acute Renal Failure: worsening   Thurmon Fair, MD, [facility] [phone] office [phone]  pager 04/18/2015 1:49 PM \

## 2015-04-19 ENCOUNTER — Inpatient Hospital Stay (HOSPITAL_COMMUNITY): Payer: Medicare HMO

## 2015-04-19 LAB — CULTURE, BAL-QUANTITATIVE
CULTURE: NORMAL
SPECIAL REQUESTS: NORMAL

## 2015-04-19 LAB — CULTURE, BAL-QUANTITATIVE W GRAM STAIN: Colony Count: 3000

## 2015-04-19 LAB — POCT I-STAT 3, ART BLOOD GAS (G3+)
ACID-BASE DEFICIT: 2 mmol/L (ref 0.0–2.0)
Bicarbonate: 25.3 mEq/L — ABNORMAL HIGH (ref 20.0–24.0)
O2 SAT: 93 %
Patient temperature: 97.3
TCO2: 27 mmol/L (ref 0–100)
pCO2 arterial: 53.1 mmHg — ABNORMAL HIGH (ref 35.0–45.0)
pH, Arterial: 7.283 — ABNORMAL LOW (ref 7.350–7.450)
pO2, Arterial: 73 mmHg — ABNORMAL LOW (ref 80.0–100.0)

## 2015-04-19 LAB — COMPREHENSIVE METABOLIC PANEL
ALT: 21 U/L (ref 14–54)
ANION GAP: 17 — AB (ref 5–15)
AST: 17 U/L (ref 15–41)
Albumin: 2 g/dL — ABNORMAL LOW (ref 3.5–5.0)
Alkaline Phosphatase: 67 U/L (ref 38–126)
BUN: 120 mg/dL — ABNORMAL HIGH (ref 6–20)
CHLORIDE: 98 mmol/L — AB (ref 101–111)
CO2: 24 mmol/L (ref 22–32)
Calcium: 8.2 mg/dL — ABNORMAL LOW (ref 8.9–10.3)
Creatinine, Ser: 5 mg/dL — ABNORMAL HIGH (ref 0.44–1.00)
GFR, EST AFRICAN AMERICAN: 10 mL/min — AB (ref >60)
GFR, EST NON AFRICAN AMERICAN: 8 mL/min — AB (ref >60)
Glucose, Bld: 123 mg/dL — ABNORMAL HIGH (ref 65–99)
POTASSIUM: 5.1 mmol/L (ref 3.5–5.1)
Sodium: 139 mmol/L (ref 135–145)
Total Bilirubin: 0.5 mg/dL (ref 0.3–1.2)
Total Protein: 4.9 g/dL — ABNORMAL LOW (ref 6.5–8.1)

## 2015-04-19 LAB — CBC
HCT: 29.3 % — ABNORMAL LOW (ref 36.0–46.0)
Hemoglobin: 9.1 g/dL — ABNORMAL LOW (ref 12.0–15.0)
MCH: 28.4 pg (ref 26.0–34.0)
MCHC: 31.1 g/dL (ref 30.0–36.0)
MCV: 91.6 fL (ref 78.0–100.0)
PLATELETS: 136 10*3/uL — AB (ref 150–400)
RBC: 3.2 MIL/uL — ABNORMAL LOW (ref 3.87–5.11)
RDW: 17 % — ABNORMAL HIGH (ref 11.5–15.5)
WBC: 7.8 10*3/uL (ref 4.0–10.5)

## 2015-04-19 LAB — BRAIN NATRIURETIC PEPTIDE: B NATRIURETIC PEPTIDE 5: 619.1 pg/mL — AB (ref 0.0–100.0)

## 2015-04-19 LAB — GLUCOSE, CAPILLARY
GLUCOSE-CAPILLARY: 110 mg/dL — AB (ref 65–99)
GLUCOSE-CAPILLARY: 123 mg/dL — AB (ref 65–99)

## 2015-04-19 LAB — APTT: APTT: 34 s (ref 24–37)

## 2015-04-19 MED ORDER — PREDNISONE 5 MG/5ML PO SOLN: 40.0000 mg | Freq: Every day | ORAL | Status: DC

## 2015-04-19 MED ORDER — MORPHINE SULFATE 25 MG/ML IV SOLN
10.0000 mg/h | INTRAVENOUS | Status: DC
  Administered 2015-04-19: 10 mg/h via INTRAVENOUS
  Filled 2015-04-19: qty 10

## 2015-04-19 MED ORDER — METOLAZONE 10 MG PO TABS
10.0000 mg | ORAL_TABLET | Freq: Two times a day (BID) | ORAL | Status: DC
  Filled 2015-04-19: qty 1

## 2015-04-19 MED ORDER — MORPHINE BOLUS VIA INFUSION
5.0000 mg | INTRAVENOUS | Status: DC | PRN
  Filled 2015-04-19: qty 20

## 2015-04-19 MED ORDER — FUROSEMIDE 10 MG/ML IJ SOLN
160.0000 mg | Freq: Four times a day (QID) | INTRAVENOUS | Status: DC
  Filled 2015-04-19 (×2): qty 16

## 2015-04-19 MED ORDER — SODIUM CHLORIDE 0.9 % IV SOLN
2.0000 mg/h | INTRAVENOUS | Status: DC
  Administered 2015-04-19: 2 mg/h via INTRAVENOUS
  Filled 2015-04-19: qty 10

## 2015-04-19 MED ORDER — MIDAZOLAM BOLUS VIA INFUSION (WITHDRAWAL LIFE SUSTAINING TX)
5.0000 mg | INTRAVENOUS | Status: DC | PRN
  Filled 2015-04-19: qty 20

## 2015-04-22 ENCOUNTER — Telehealth: Payer: Self-pay | Admitting: Internal Medicine

## 2015-04-22 ENCOUNTER — Other Ambulatory Visit: Payer: Self-pay | Admitting: Internal Medicine

## 2015-04-22 ENCOUNTER — Ambulatory Visit: Payer: Medicare HMO | Admitting: Cardiovascular Disease

## 2015-04-22 NOTE — Telephone Encounter (Signed)
It will come t ordering MD Pl check with michelle at death certificate/ records to confirm

## 2015-04-22 NOTE — Telephone Encounter (Signed)
Called Marcelino Duster in medical records. I was told to speak to Mellody Dance about this matter. Spoke with Mellody Dance and was told that JY will be the one to sign this death certificate. We are unaware of how long the autopsy will take, if one is done. We are also unaware of who will call them with the results. Mellody Dance is working is on this. 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

## 2015-04-22 NOTE — Telephone Encounter (Signed)
Dr Vassie Loll, Dr Tyson Alias is off this wk  Can you please advise on how long the autopsy result will take and who will be the one to call the pt's family with results once received? Please advise, thanks

## 2015-04-23 ENCOUNTER — Other Ambulatory Visit: Payer: Self-pay

## 2015-04-23 ENCOUNTER — Other Ambulatory Visit: Payer: Self-pay | Admitting: Specialist

## 2015-04-23 NOTE — Telephone Encounter (Signed)
Spoke with pt's sister Okey Regal, advised her that Mellody Dance was working with hospitalists to try and find answers to her below stated questions.  I advised that once we had a better timeframe to share we would keep her updated.  Okey Regal expressed understanding.    Will keep message in triage as well as forward to team leads at Crystal's request.

## 2015-04-23 NOTE — Telephone Encounter (Signed)
Okey Regal cb [phone] or [phone]

## 2015-04-25 ENCOUNTER — Inpatient Hospital Stay: Payer: Medicare HMO | Admitting: Adult Health

## 2015-04-25 LAB — ACID FAST SMEAR (AFB): ACID FAST SMEAR - AFSCU2: NEGATIVE

## 2015-04-25 LAB — ACID FAST SMEAR (AFB, MYCOBACTERIA): Acid Fast Smear: NEGATIVE

## 2015-04-25 NOTE — Telephone Encounter (Signed)
Working with Mellody Dance on this.

## 2015-04-26 ENCOUNTER — Other Ambulatory Visit: Payer: Self-pay

## 2015-04-26 NOTE — Telephone Encounter (Signed)
Called Pathology at [facility] and spoke with Tammy.  Per Tammy complete autopsies can take up to 30days to complete and receive report.  This patient's PRELIMINARY report/letter was just done yesterday 3.23.17 and sent to transcription (this was addressed to Dr Tyson Alias).  It will be up to Dr Tyson Alias to contact the family.  The complete report will follow a similar path >> Pathologist >> Dr Nat Math >> patient's family.  If Dr Tyson Alias would like to speak with the pathologist Dr Laureen Ochs, he can be reached at the [facility] Pathology office @ [phone].  LMOM TCB x1 for Dale.

## 2015-04-26 NOTE — Telephone Encounter (Signed)
[phone], Rachel Baker

## 2015-04-26 NOTE — Telephone Encounter (Signed)
Called spoke with pt's sister Okey Regal and discussed findings with her on her sister's autopsy.   Okey Regal voiced her understanding. Message routed to Dr Tyson Alias - please call Okey Regal at her cell [phone]

## 2015-04-27 ENCOUNTER — Other Ambulatory Visit: Payer: Self-pay

## 2015-04-28 LAB — ANAEROBIC CULTURE

## 2015-04-29 ENCOUNTER — Telehealth: Payer: Self-pay

## 2015-04-29 LAB — CULTURE, BLOOD (ROUTINE X 2): CULTURE: NO GROWTH

## 2015-04-29 LAB — TISSUE CULTURE

## 2015-04-29 LAB — CULTURE, ROUTINE-ABSCESS

## 2015-04-29 NOTE — Telephone Encounter (Signed)
On 04/29/2015 I received a death certificate from [facility] [employer] (orginal). The death certificate is for cremation. The patient is a patient of Doctor Tyson Alias. The death certificate will be taken to [facility] ([id number]) this pm for signature. On May 14, 2015 I received the death certificate back from Doctor Tyson Alias. I got the death certificate ready and called the funeral home to let them know the death certificate is ready for pickup. I also faxed a copy to the funeral home per their request.

## 2015-04-30 ENCOUNTER — Other Ambulatory Visit: Payer: Self-pay

## 2015-05-01 LAB — VIRUS CULTURE

## 2015-05-03 ENCOUNTER — Other Ambulatory Visit: Payer: Self-pay

## 2015-05-03 ENCOUNTER — Telehealth: Payer: Self-pay | Admitting: Internal Medicine

## 2015-05-03 NOTE — Telephone Encounter (Signed)
LMTCB for Rachel Baker

## 2015-05-04 NOTE — Progress Notes (Signed)
Patient eye care for organ donation done.

## 2015-05-04 NOTE — Progress Notes (Signed)
Extubated for comfort care.  RN and sister at bedside.  Placed on RA after extubation.  Will continue to monitor.

## 2015-05-04 NOTE — Progress Notes (Signed)
PULMONARY / CRITICAL CARE MEDICINE   Name: Rachel Baker MRN: [id number] DOB: 07-Aug-1950    ADMISSION DATE:  04/17/2015 CONSULTATION DATE:  04/11/2015  REFERRING MD:  [facility]  CHIEF COMPLAINT:  Short of breath  SUBJECTIVE:  Remains on high MV Despite lasix, 4 liters up  VITAL SIGNS: BP 97/53 mmHg  Pulse 66  Temp(Src) 97.3 F (36.3 C) (Oral)  Resp 25  Ht 5\' 3"  (1.6 m)  Wt 115.1 kg (253 lb 12 oz)  BMI 44.96 kg/m2  SpO2 98%  VENTILATOR SETTINGS: Vent Mode:  [-] PRVC FiO2 (%):  [40 %-50 %] 50 % Set Rate:  [35 bmp] 35 bmp Vt Set:  [390 mL] 390 mL PEEP:  [5 cmH20] 5 cmH20 Plateau Pressure:  [23 cmH20-31 cmH20] 27 cmH20  INTAKE / OUTPUT: I/O last 3 completed shifts: In: 5839 [I.V.:4506.5; NG/GT:1200.5; IV Piggyback:132] Out: 535 [Urine:535]  PHYSICAL EXAMINATION: General: sedated, about to do [facility] Neuro:  RASS -4 HEENT:  ETT in place Cardiovascular:  s1 s2  Regular, 2/6 SM no changes in charachter Lungs: cta  Abdomen:  Soft, non tender, wb noted , no r Musculoskeletal:  No edema Skin:  No rashes  LABS:  BMET  Recent Labs Lab 04/18/15 0956 04/18/15 2220 04/13/2015 0500  NA 140 138 139  K 4.7 4.9 5.1  CL 102 98* 98*  CO2 21* 23 24  BUN 102* 111* 120*  CREATININE 4.56* 4.72* 5.00*  GLUCOSE 99 130* 123*    Electrolytes  Recent Labs Lab 04/17/15 2149 04/18/15 0956 04/18/15 2220 04/17/2015 0500  CALCIUM 8.5* 8.4* 8.2* 8.2*  MG 2.3 2.3 2.5*  --   PHOS 10.4* 9.8* 11.1*  --     CBC  Recent Labs Lab 04/17/15 0439 04/18/15 0500 04/18/2015 0500  WBC 14.0* 6.8 7.8  HGB 9.6* 8.6* 9.1*  HCT 32.0* 26.5* 29.3*  PLT 183 129* 136*    Coag's  Recent Labs Lab 04/17/15 0439 04/18/15 0500 04/05/2015 0500  APTT 33 36 34    Sepsis Markers  Recent Labs Lab 04/13/15 0502 04/15/15 0355  LATICACIDVEN  --  0.9  PROCALCITON <0.10 4.13    ABG  Recent Labs Lab 04/17/15 2359 04/18/15 1012 04/30/2015 0336  PHART 7.283* 7.268* 7.283*  PCO2ART 45.5*  48.7* 53.1*  PO2ART 70.0* 90.4 73.0*    Liver Enzymes  Recent Labs Lab 04/17/15 0439 04/25/2015 0500  AST 23 17  ALT 18 21  ALKPHOS 68 67  BILITOT 0.6 0.5  ALBUMIN 2.0* 2.0*    Cardiac Enzymes  Recent Labs Lab 04/13/15 1006 04/13/15 2105  TROPONINI 0.49* 1.26*    Glucose  Recent Labs Lab 04/18/15 0824 04/18/15 1208 04/18/15 1547 04/18/15 2002 04/18/15 2358 04/25/2015 0327  GLUCAP 73 95 94 109* 123* 110*    Imaging Dg Chest Port 1 View  04/03/2015  CLINICAL DATA:  ARDS, respiratory failure, ventilatory support EXAM: PORTABLE CHEST 1 VIEW COMPARISON:  04/18/2015 FINDINGS: Endotracheal tube 3.2 cm above the carina. Left IJ central line tip mid SVC. Stable cardiomegaly with central vascular congestion and diffuse bilateral mixed interstitial and airspace opacities. Pattern compatible with ARDS or pneumonia. No enlarging effusion or pneumothorax. No new collapse or consolidation. Degenerative changes of the spine. IMPRESSION: Stable diffuse airspace process compatible with ARDS or pneumonia. Electronically Signed   By: 04/20/2015.  Shick M.D.   On: 05/01/2015 07:55   Dg Chest Port 1 View  04/18/2015  CLINICAL DATA:  Adult respiratory distress syndrome. EXAM: PORTABLE CHEST 1 VIEW COMPARISON:  April 17, 2015. FINDINGS: Stable cardiomediastinal silhouette. Endotracheal and feeding tube are not significantly changed in position. Left internal jugular catheter is not significantly changed. No pneumothorax is noted. Stable bilateral interstitial lung densities are noted concerning for pneumonia or edema. Bony thorax is unremarkable. IMPRESSION: Stable support apparatus. Stable bilateral lung opacities are noted concerning for edema or pneumonia. Electronically Signed   By: Lupita Raider, M.D.   On: 04/18/2015 07:51    STUDIES:  Echo 3/11 >EF 65-70%, GR1 DD , AV mod regurg, LA mod dilation, nml PAP, MV mod stenosis   CULTURES: 3/09 Influenza PCR >> Flu B positive 3/9 BC >> Sputum  3/11>>>NF [facility] 3/14  Culture>>>3 k NF (gram stain pairs)  HSV>>>  AFB>>>neg  PCP>>>neg  ANTIBIOTICS: 3/08 Vancomycin >>>off 3/08 Azactam >>off 3/10 cefepime>>> 3/09 Tamiflu >>>10 days  SIGNIFICANT EVENTS: 3/08 Admit 3/11 VDRF, paralysis 3/13- pos 3 liters 3/14- self extubated, retubed rapid 3/15- declining status, worse infiltrates, worsening vent, pressors - sedation 3/17- 4 liter sup despite lasix  LINES/TUBES: 3/11  ETT>>>self extubated 3/14 (retubed in 10 min)>>>  DISCUSSION: 65 yo female former smoker presented from NH with dyspnea.  Found to have influenza B.  Was in hospital in February 2017 with pulmonary infiltrates of unclear cause >> serology, bronchoscopy negative.  ASSESSMENT / PLAN:  PULMONARY A: Acute respiratory failure with hypoxia 2nd to Influenza B. ARDS, permissive hypercapnia Pneumonitis, flu , other superinfection likely  Unlikely autoimmune P:   Worsening infiltrates, with pos balance If continued support would escalate lasix further pcxr in am requires high MV, low volumes  RHEUM 2/12 ana - neg, anca neg, dsdna neg, RA 16  (slight elevated), scl 70 neg 3/14-repeat crp 6.4, RA 11.5, C3 125, c4 27, ch50 >60 up unlikley autoimmune  CARDIOVASCULAR A:  Hx of Hypertrophic CM, mitral valve nodule. Echo 3/11 nml EF , gr 1 DD , MV mod stenosis  Troponin elevation  Hypotension /Septic Shock - improved HOCM P:  Continue AS tele cvp goal 4 Steroids did nothing, dc'ed for lungs Avoid any beta agonists  RENAL A:   Acute Renal Faliure worsening, ARDS Rising crt with pos 2 liters Family does NOT want HD P:   Chem in am  Lasix 160 bid to q6h if continued support Add xaroxlyne   GASTROINTESTINAL A:   Morbid obesity. Hx of GERD. High residuals P:   Protonix Tube feeds Lactulose for m needed  HEMATOLOGIC A:   Leukocytosis improved P:  Hep sub q  INFECTIOUS A:   Pneumonia 2nd to influenza B with superinfection likely R/o  pneumonitis (unlikley autoimmune, NOT steroid responsive eother) R/o noninfectious PNA P:   Continue tamiflu to stop date Cefepime to stop date  ENDOCRINE A:   Steroid induced hyperglycemia. P:   ssi roids to stress roids  NEUROLOGIC A:   Acute encephalopathy 2nd to respiratory failure. ARDS P:   fent  [facility] Goal dc versed Calling sister to consider comfort are today RASS goal -1 to -2  Ccm time 30 min   Mcarthur Rossetti. Tyson Alias, MD, FACP Pgr: [phone] [facility]

## 2015-05-04 NOTE — Progress Notes (Signed)
   May 02, 2015 1000  Clinical Encounter Type  Visited With Patient and family together;Health care provider  Visit Type Initial;Psychological support;Spiritual support;Social support;Patient actively dying  Referral From Family  Consult/Referral To Chaplain;Faith community  Spiritual Encounters  Spiritual Needs Emotional;Grief support  Stress Factors  Patient Stress Factors None identified  Family Stress Factors Exhausted;Loss   Chaplain stopped by to visit with family of Pt. Chaplain provided emotional care and spiritual care via prayer. Please page chaplain if family is in need of more support. Thanks!   Chaplain Bea Susann Givens

## 2015-05-04 NOTE — Progress Notes (Signed)
Patient made comfort care, per sister's request.  Morphine and versed continuous infusions started.  All unnecessary equipment removed.  Patient appears comfortable, sister at bedside

## 2015-05-04 NOTE — Progress Notes (Signed)
Wasted 230 mL of Morphine and 45 mL of Versed with Janice Coffin, RN

## 2015-05-04 NOTE — Progress Notes (Signed)
UR Completed. Michaell Grider, RN, BSN.  [phone] 

## 2015-05-04 NOTE — Progress Notes (Signed)
Wasted of fentanyl with Barbaraann Faster, RN

## 2015-05-04 NOTE — Progress Notes (Signed)
Patient ID: Rachel Baker, female   DOB: 07-Jul-1950, 65 y.o.   MRN: [id number]  Extensive discussion with family sister. We discussed the poor prognosis and likely poor quality of life. Family has decided to offer full comfort care. They are aware that the patient may be transferred to palliative care floor for continued comfort care needs. They have been fully updated on the process and expectations. We both want autopsy and she wil consent to it Calling pathology Mcarthur Rossetti. Tyson Alias, MD, FACP Pgr: [phone] [facility]

## 2015-05-04 NOTE — Progress Notes (Signed)
Patient EKG reading asystole.  Enter patients room with Janice Coffin, RN.  No breath sounds or heart sounds heard.  Pupils none reactive.  Confirmed with second nurse.  Sister at bedside.  Comfort given to sister.

## 2015-05-04 DEATH — deceased

## 2015-05-06 NOTE — Telephone Encounter (Signed)
I lmomtcb for [employer].

## 2015-05-06 NOTE — Telephone Encounter (Signed)
I spoke with Rachel Baker this morning and will follow up with her later this afternoon.

## 2015-05-07 NOTE — Telephone Encounter (Signed)
I spoke with Rachel Baker this morning.  She spoke with [facility] yesterday regarding pt's body and is aware Dr. Tyson Alias has her contact information to call her regarding autopsy results.  She verbalized understanding, was very grateful for our help, and voiced no new questions or concerns at this time.  She is to call office back if anything further is needed. Will sign off.

## 2015-05-20 LAB — FUNGUS CULTURE WITH STAIN

## 2015-05-20 LAB — FUNGAL ORGANISM REFLEX

## 2015-05-20 LAB — FUNGUS CULTURE RESULT

## 2015-05-29 LAB — FUNGUS CULTURE WITH STAIN

## 2015-05-29 LAB — FUNGUS CULTURE RESULT

## 2015-05-29 LAB — FUNGAL ORGANISM REFLEX

## 2015-05-30 LAB — ACID FAST CULTURE WITH REFLEXED SENSITIVITIES (MYCOBACTERIA): Acid Fast Culture: NEGATIVE

## 2015-06-03 NOTE — Progress Notes (Signed)
Patient ID: Rachel Baker, female   DOB: Mar 16, 1950, 65 y.o.   MRN: [id number]  I reviewed the final report Autopsy Also reviewed with Dr Ninetta Lights and Armanda Magic  The autopsy was not clear on final cause specifics I tried to call pathologist , will attempt again in am   I called sister and discussed cardiac and ARDS findings  Mcarthur Rossetti. Tyson Alias, MD, FACP Pgr: [phone] [facility]

## 2015-06-03 NOTE — Discharge Summary (Addendum)
Rachel Baker, Rachel Baker NO.:  [id number]  MEDICAL RECORD NO.:  [id number]  LOCATION:  2M13C                        FACILITY:  [facility]  PHYSICIAN:  Nelda Bucks, MD DATE OF BIRTH:  12-Sep-1950  DATE OF ADMISSION:  04-23-2015 DATE OF DISCHARGE:  05/02/2015                              DISCHARGE SUMMARY   DEATH SUMMARY  This is a 65 year old female well known to our service in Pulmonary Critical Care as she was admitted approximately a month ago at the time with bilateral infiltrates.  She also was noted to have an echocardiogram with a concerning vegetation on her mitral valve with embolic phenomenon.  She was fortunately weaned and extubated.  During her hospitalization had had a bronchoscopy, had a TEE which was essentially culture negative and was treated empirically for endocarditis, and the patient returned to [facility] at this time unfortunately with worsening bilateral infiltrates, worsening pneumonia, was found to be flu positive with worsening ARDS.  No new embolic phenomenon had occurred in between that time.  She declined a course and required intubation and had progressive worsening multiorgan dysfunction syndrome with progressively worsening infiltrates.  Required paralysis with the degree of worsening hypoxia and echo done on 03/11 to note had showed an EF of 65% to 70% with grade 1 diastolic dysfunction with AV moderate regurgitation with a mitral valve with moderate stenosis.  To note, as above, the flu B was positive and she was treated with vancomycin, Azactam, and Tamiflu aggressively. She just continued to fail despite being treated with steroids.  Autoimmune workup in the past was negative which was repeated again and was essentially negative.  She was too unstable and sick to undergo open lung biopsy with its unclear etiology and infiltrates, especially in the setting of the fact that she had flu B this time that did explain  her worsening status.  It was shown on bronchoscopy on this admission on 03/14 which showed 3000 colonies of normal flora initially with gram- stain positive for some gram-positive cocci in pairs.  She was treated again aggressively with a long duration of Tamiflu.  She had progressive anasarca and worsening bilateral infiltrates and ARDS.  The etiology of her infiltrates on the prior admission was unclear, but clearly has been exacerbated during this admission secondary to the flu.  I explained very clearly to the sister who is her medical power of attorney, that it is possible that her current acute portion of her respiratory failure due to the flu would be reversible, but it was crystal clear from the patient's prior discussions with her sister that she would not want to be continued under these circumstances given her recurrent admissions, her prior functional quality of life and status which has been declined since moving from [place] to [place], and then after the last admission of her respiratory failure, and they opted for comfort care and the patient expired.  To note, I did organize an autopsy for this patient which findings and official assessment are pending.  The gross findings from the pathologist I reviewed were very nonspecific, which essentially revealed bilateral infiltrates and cardiomegaly.  FINAL DIAGNOSES:  Upon death: 1. Initial pneumonia infection versus noninfectious, unclear. 2. On  this admission with flu B pneumonia/pneumonitis. 3. Endocarditis of unclear pathogen. 4. Acute respiratory failure. 5. Acute renal failure. 6. Septic Shock,POA     Nelda Bucks, MD     DJF/MEDQ  D:  05/06/2015  T:  05/06/2015  Job:  [id number]

## 2015-06-06 LAB — ACID FAST CULTURE WITH REFLEXED SENSITIVITIES
ACID FAST CULTURE - AFSCU3: NEGATIVE
ACID FAST CULTURE - AFSCU3: NEGATIVE

## 2015-06-06 LAB — ACID FAST CULTURE WITH REFLEXED SENSITIVITIES (MYCOBACTERIA)

## 2017-12-16 IMAGING — CR DG CHEST 2V
2 series · 2 of 2 positions shown · non-contrast
Comparison: None.

CLINICAL DATA: Severe shortness of breath for 1 week

EXAM:
CHEST  2 VIEW

[w chest pa *]
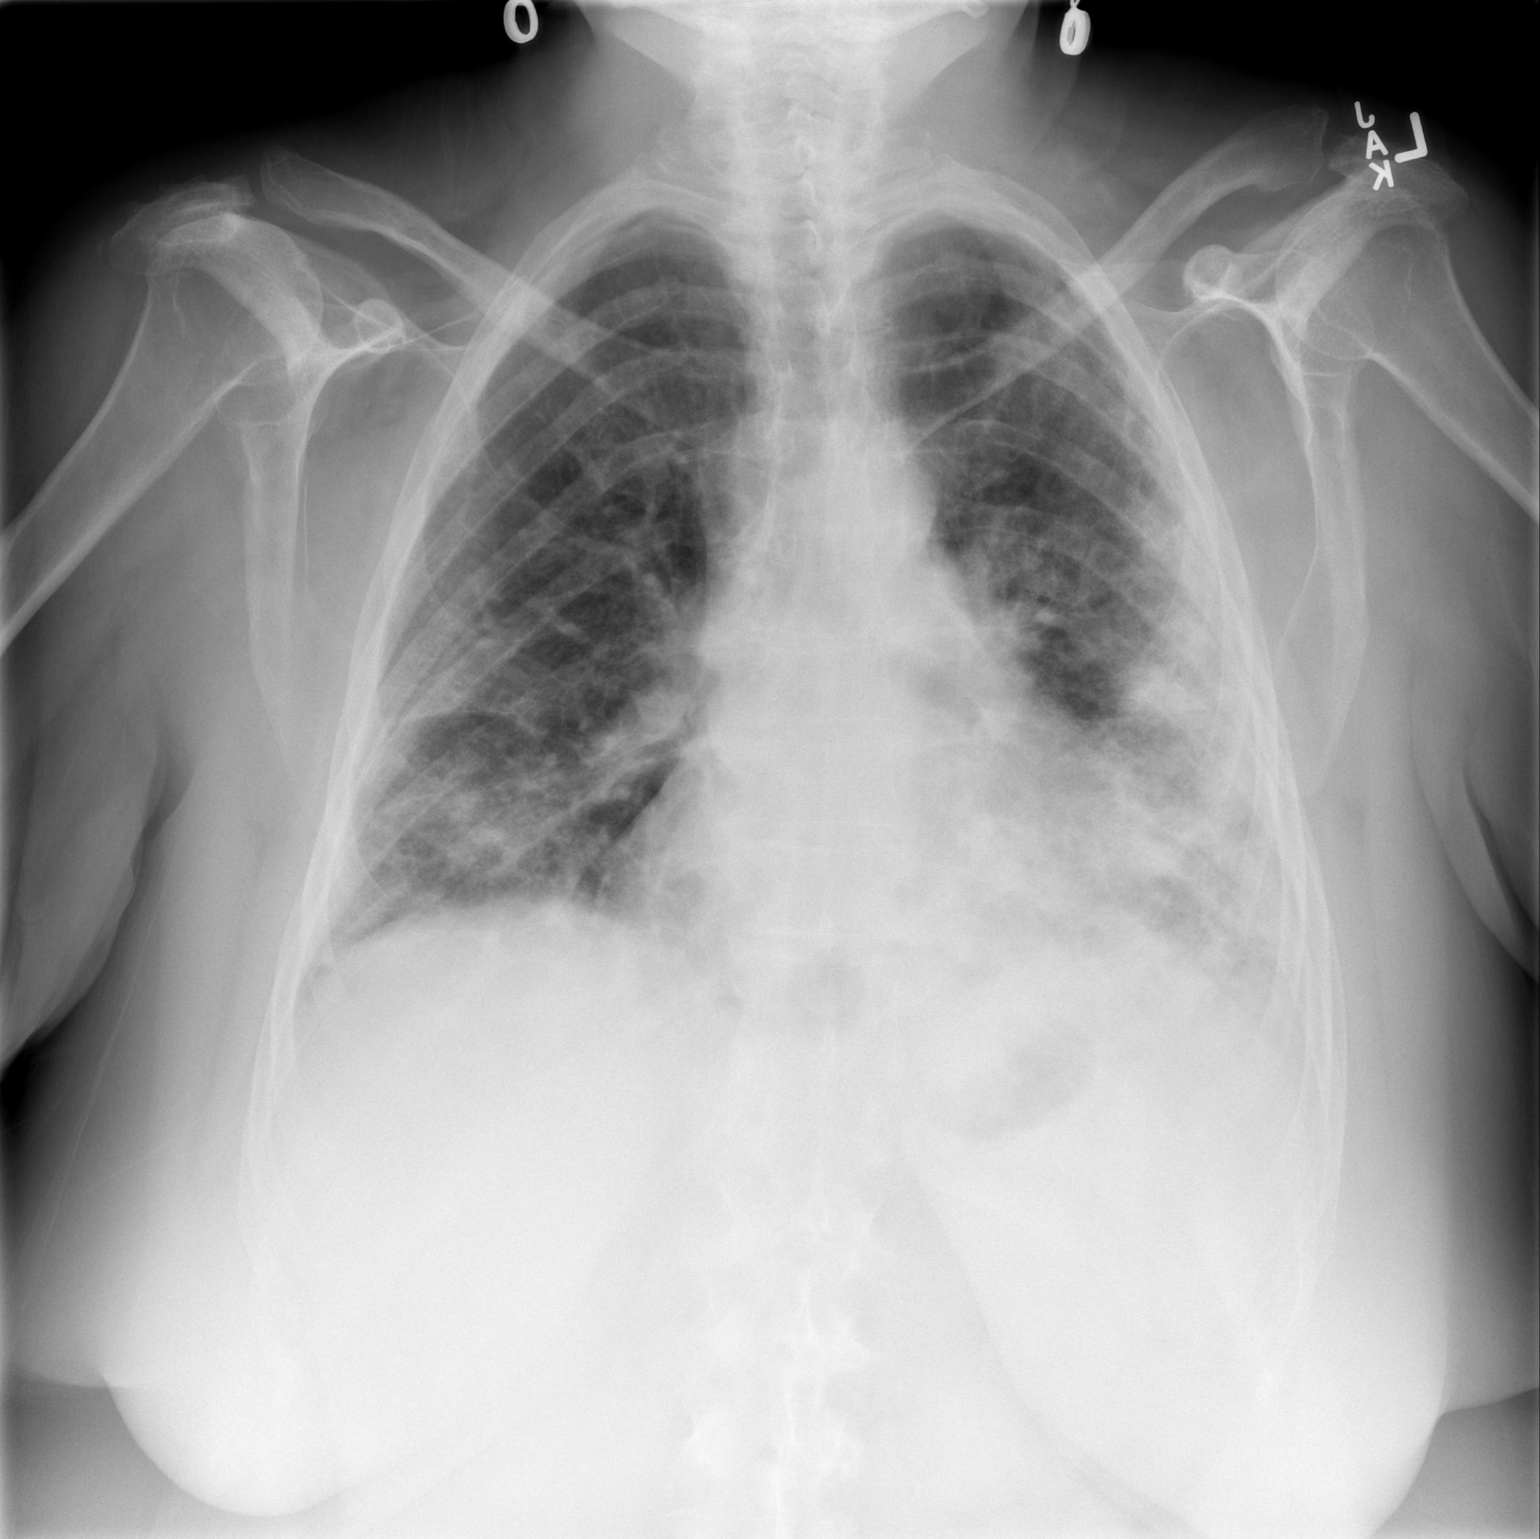

[w chest lat]
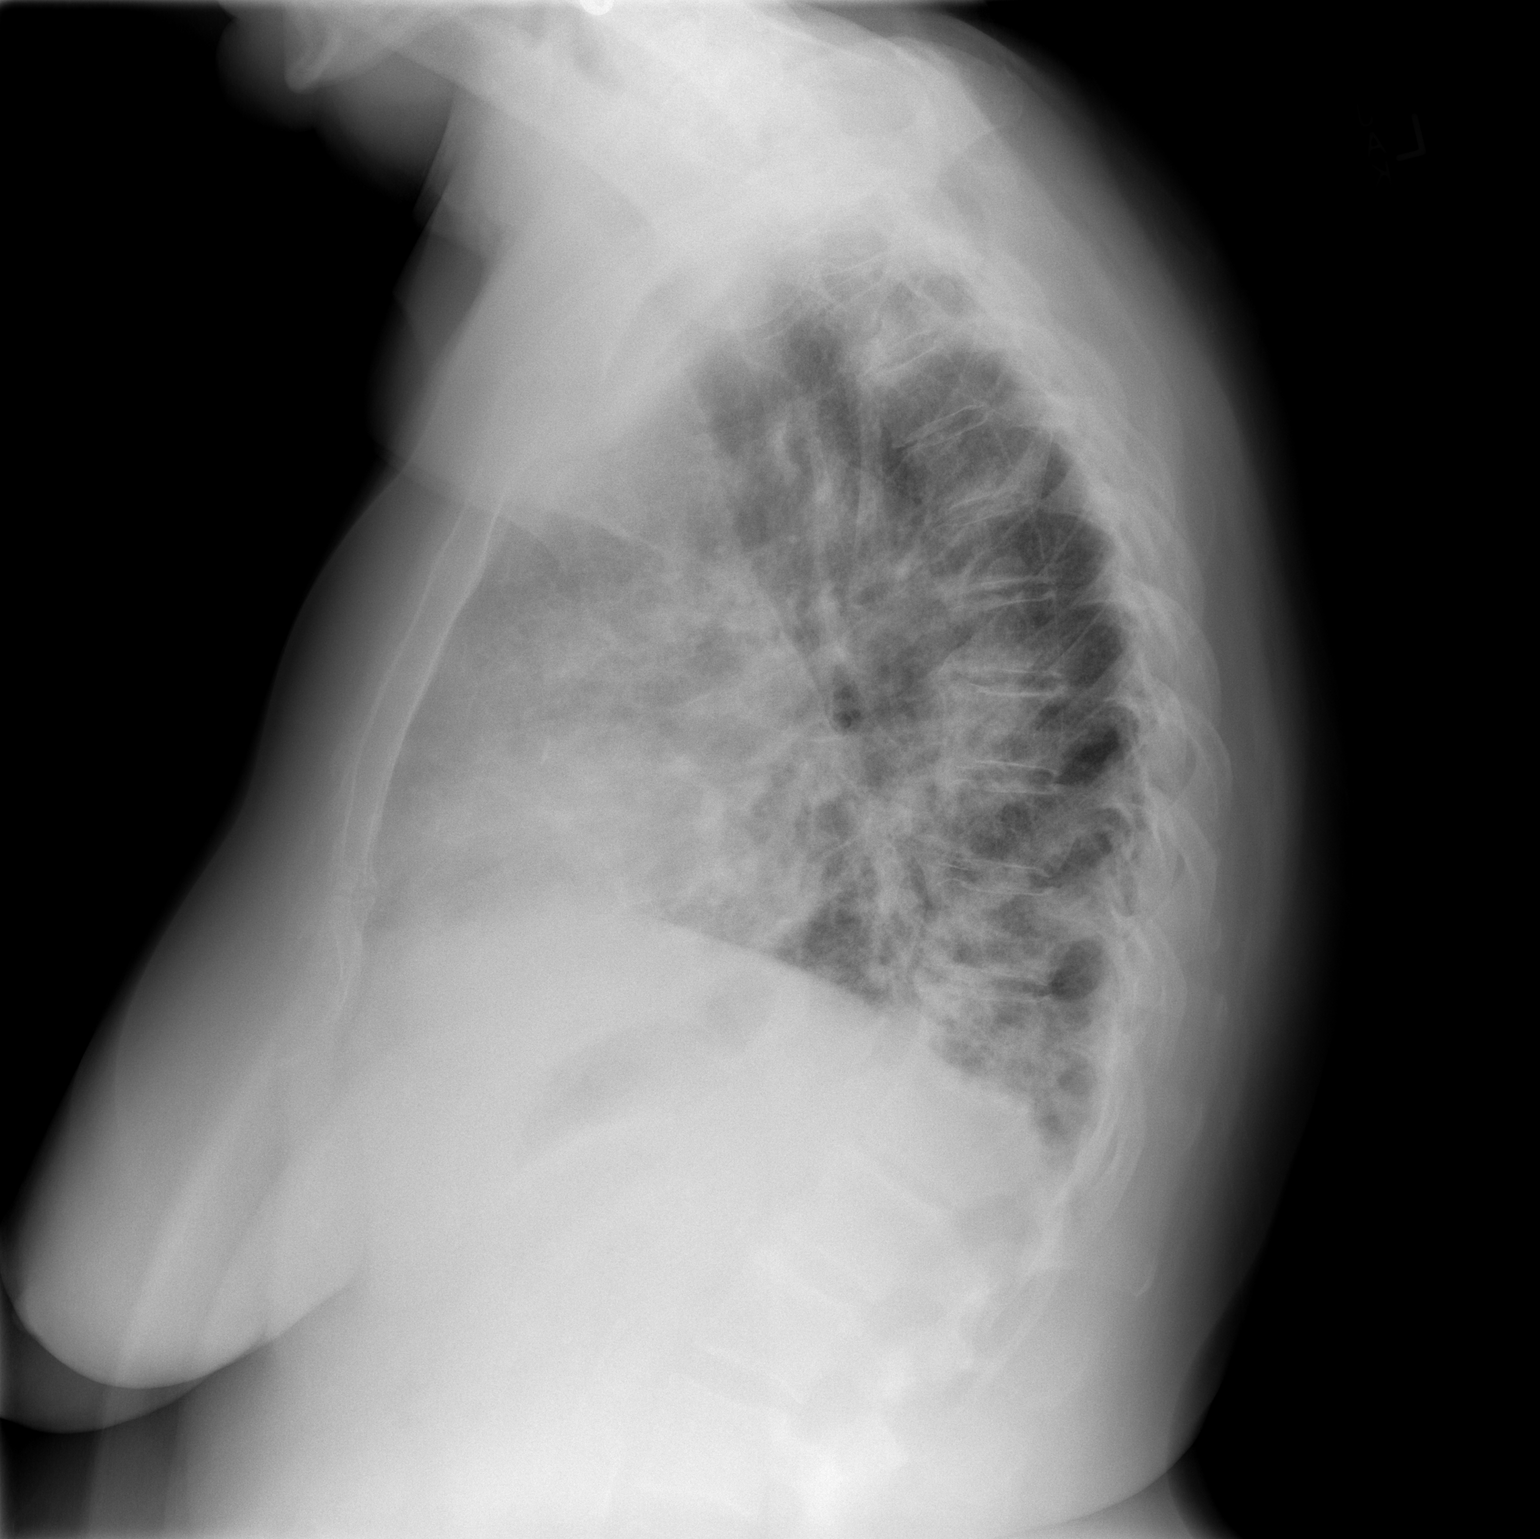

[2 of 2 positions shown; findings below may reference images not displayed]

FINDINGS: Mild cardiac enlargement. Vascular pattern is normal. Multifocal
airspace disease throughout the bilateral mid to lower lung zones,
left worse than right. No pleural effusion.
IMPRESSION: Moderately severe multifocal bilateral nonspecific airspace disease.
One consideration would be pneumonia.

## 2017-12-19 IMAGING — DX DG CHEST 2V
2 series · 2 of 2 positions shown · non-contrast
Comparison: 03/15/2015

CLINICAL DATA: Shortness of breath and cough since [REDACTED], former
smoker

EXAM:
CHEST  2 VIEW

[w chest pa]
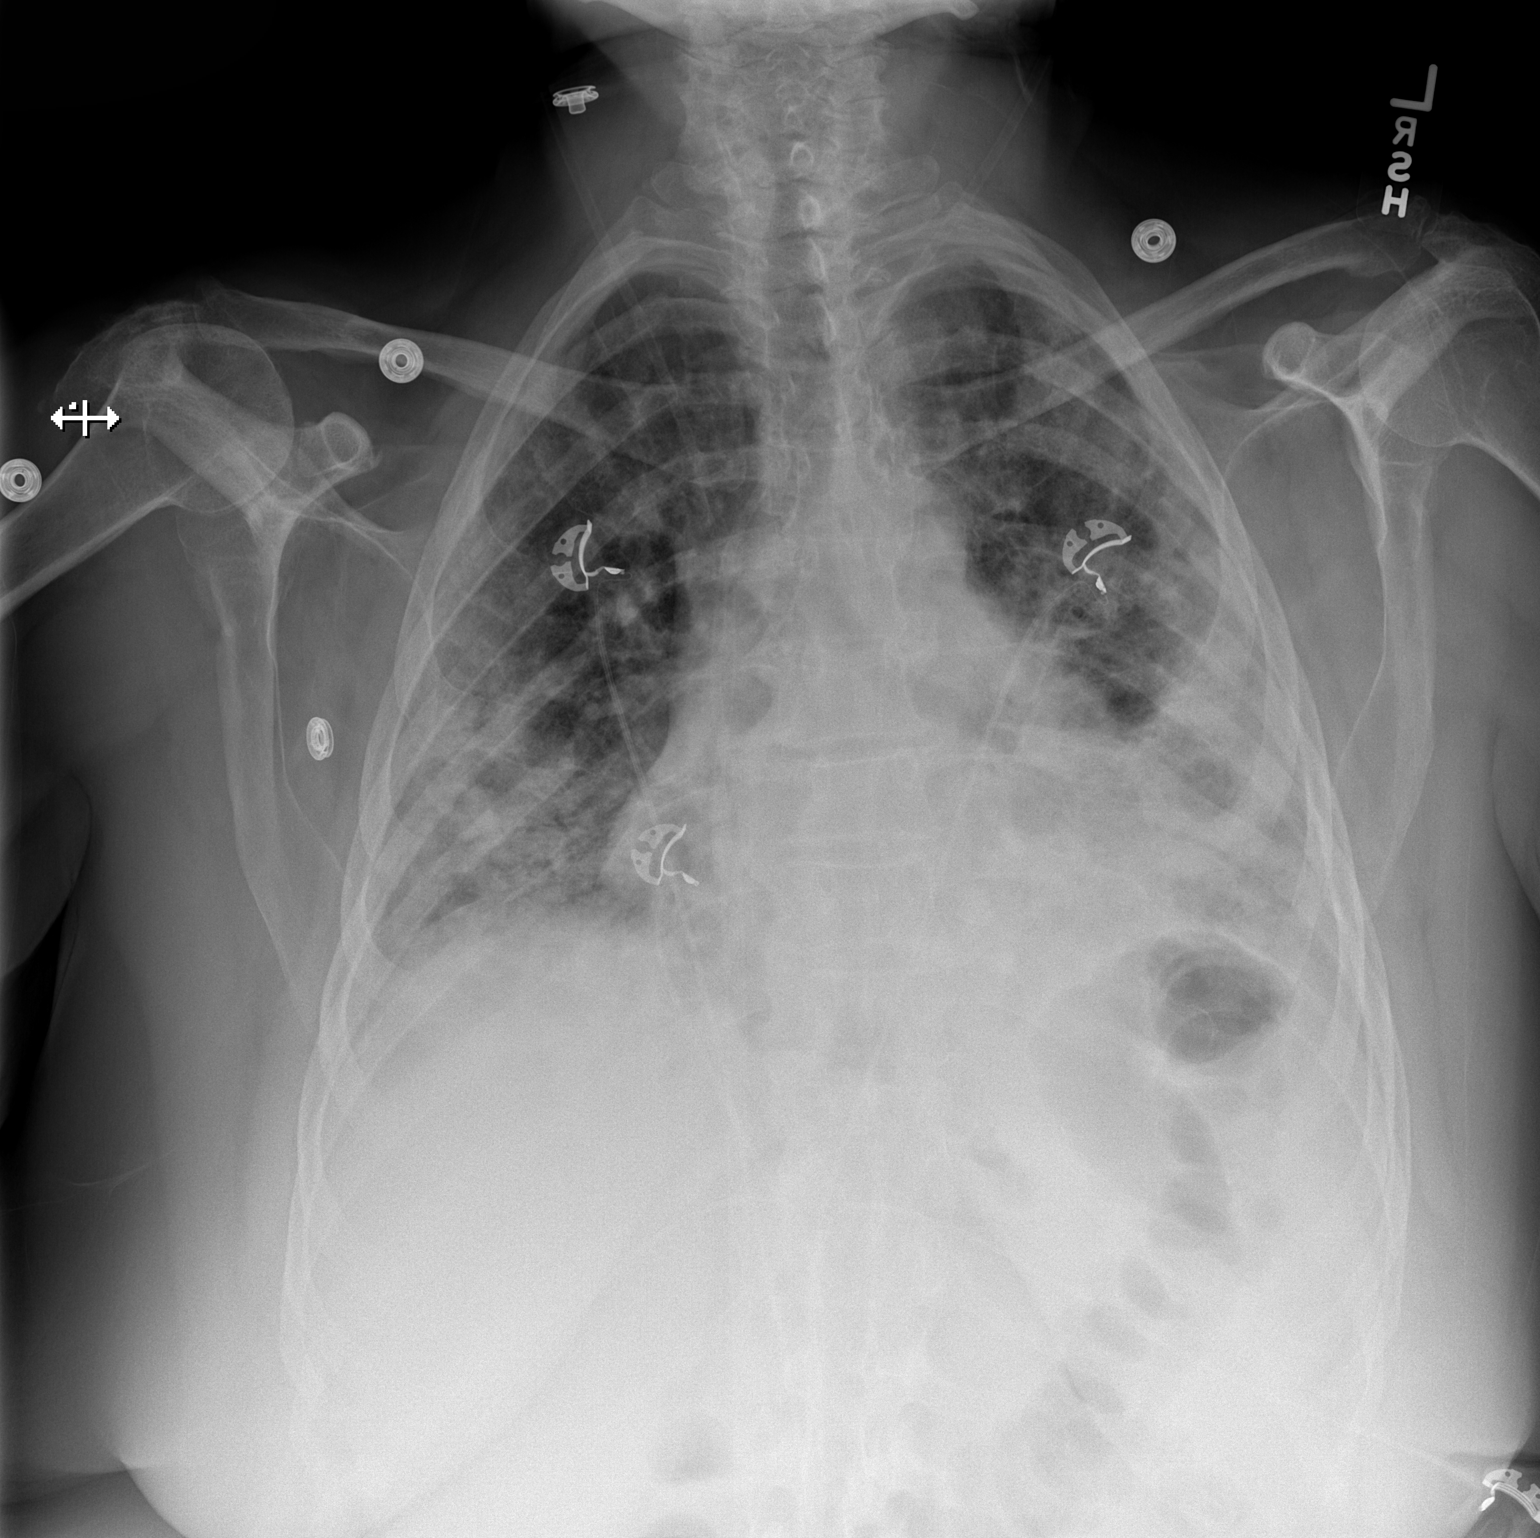

[w chest lat]
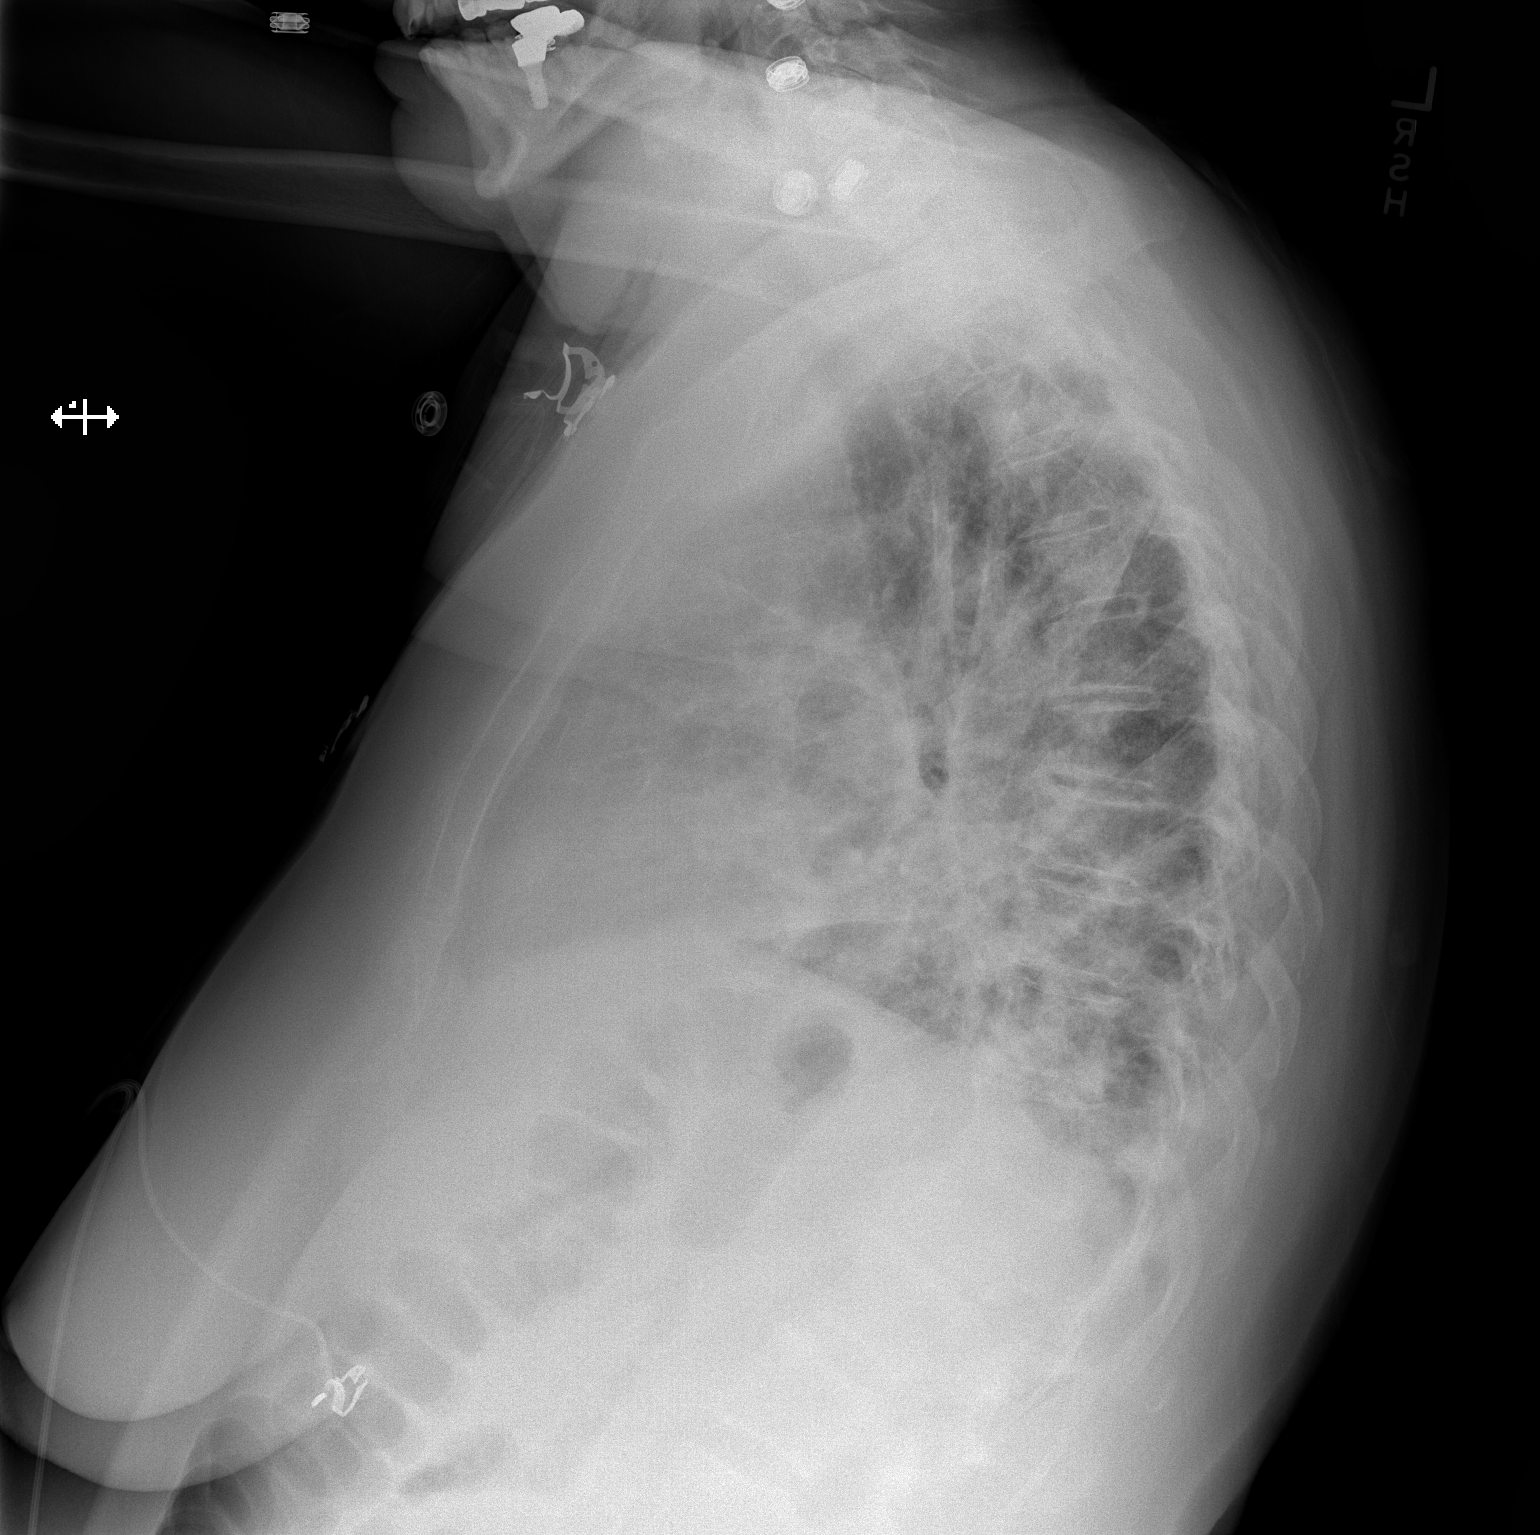

[2 of 2 positions shown; findings below may reference images not displayed]

FINDINGS: Enlargement of cardiac silhouette.

Stable mediastinal contours.

Diffuse BILATERAL airspace infiltrates, increased at RIGHT lower
lobe versus previous study.

Remaining lungs grossly unchanged.

No definite pleural effusion or pneumothorax.

Degenerative disc disease changes thoracic spine.
IMPRESSION: Diffuse BILATERAL airspace infiltrates question pneumonia, increased
in RIGHT lower lobe since previous exam.
